# Patient Record
Sex: Male | Born: 1965
Health system: Southern US, Community
[De-identification: ages and names within clinical notes are randomized; demographics above are authoritative.]

## PROBLEM LIST (undated history)

## (undated) DIAGNOSIS — R7989 Other specified abnormal findings of blood chemistry: Secondary | ICD-10-CM

## (undated) DIAGNOSIS — G4730 Sleep apnea, unspecified: Secondary | ICD-10-CM

## (undated) DIAGNOSIS — M1990 Unspecified osteoarthritis, unspecified site: Secondary | ICD-10-CM

## (undated) DIAGNOSIS — M545 Low back pain, unspecified: Secondary | ICD-10-CM

## (undated) DIAGNOSIS — N529 Male erectile dysfunction, unspecified: Secondary | ICD-10-CM

## (undated) DIAGNOSIS — K760 Fatty (change of) liver, not elsewhere classified: Secondary | ICD-10-CM

## (undated) DIAGNOSIS — T7840XA Allergy, unspecified, initial encounter: Secondary | ICD-10-CM

## (undated) DIAGNOSIS — N400 Enlarged prostate without lower urinary tract symptoms: Secondary | ICD-10-CM

## (undated) DIAGNOSIS — I10 Essential (primary) hypertension: Secondary | ICD-10-CM

## (undated) DIAGNOSIS — E785 Hyperlipidemia, unspecified: Secondary | ICD-10-CM

## (undated) DIAGNOSIS — E039 Hypothyroidism, unspecified: Secondary | ICD-10-CM

## (undated) DIAGNOSIS — K219 Gastro-esophageal reflux disease without esophagitis: Secondary | ICD-10-CM

## (undated) DIAGNOSIS — R748 Abnormal levels of other serum enzymes: Secondary | ICD-10-CM

## (undated) DIAGNOSIS — E119 Type 2 diabetes mellitus without complications: Secondary | ICD-10-CM

## (undated) HISTORY — DX: Abnormal levels of other serum enzymes: R74.8

## (undated) HISTORY — DX: Allergy, unspecified, initial encounter: T78.40XA

## (undated) HISTORY — PX: WISDOM TOOTH EXTRACTION: SHX21

## (undated) HISTORY — DX: Essential (primary) hypertension: I10

## (undated) HISTORY — DX: Male erectile dysfunction, unspecified: N52.9

## (undated) HISTORY — DX: Unspecified osteoarthritis, unspecified site: M19.90

## (undated) HISTORY — DX: Type 2 diabetes mellitus without complications: E11.9

## (undated) HISTORY — DX: Low back pain, unspecified: M54.50

## (undated) HISTORY — DX: Benign prostatic hyperplasia without lower urinary tract symptoms: N40.0

## (undated) HISTORY — PX: COLONOSCOPY: SHX174

## (undated) HISTORY — DX: Low back pain: M54.5

## (undated) HISTORY — DX: Sleep apnea, unspecified: G47.30

## (undated) HISTORY — DX: Gastro-esophageal reflux disease without esophagitis: K21.9

## (undated) HISTORY — DX: Other specified abnormal findings of blood chemistry: R79.89

## (undated) HISTORY — DX: Hypothyroidism, unspecified: E03.9

## (undated) HISTORY — DX: Fatty (change of) liver, not elsewhere classified: K76.0

## (undated) HISTORY — DX: Hyperlipidemia, unspecified: E78.5

## (undated) HISTORY — PX: HERNIA REPAIR: SHX51

---

## 1997-11-02 ENCOUNTER — Emergency Department (HOSPITAL_COMMUNITY): Admission: EM | Admit: 1997-11-02 | Discharge: 1997-11-02 | Payer: Self-pay | Admitting: Emergency Medicine

## 1998-05-18 ENCOUNTER — Other Ambulatory Visit: Admission: RE | Admit: 1998-05-18 | Discharge: 1998-05-18 | Payer: Self-pay | Admitting: Urology

## 2002-10-25 ENCOUNTER — Emergency Department (HOSPITAL_COMMUNITY): Admission: EM | Admit: 2002-10-25 | Discharge: 2002-10-25 | Payer: Self-pay | Admitting: Emergency Medicine

## 2002-10-25 ENCOUNTER — Encounter: Payer: Self-pay | Admitting: Emergency Medicine

## 2004-09-30 ENCOUNTER — Ambulatory Visit: Payer: Self-pay | Admitting: Internal Medicine

## 2004-12-28 ENCOUNTER — Ambulatory Visit: Payer: Self-pay | Admitting: Internal Medicine

## 2005-09-15 ENCOUNTER — Ambulatory Visit: Payer: Self-pay | Admitting: Family Medicine

## 2007-02-22 DIAGNOSIS — J309 Allergic rhinitis, unspecified: Secondary | ICD-10-CM

## 2007-02-22 DIAGNOSIS — E039 Hypothyroidism, unspecified: Secondary | ICD-10-CM | POA: Insufficient documentation

## 2007-02-22 DIAGNOSIS — K219 Gastro-esophageal reflux disease without esophagitis: Secondary | ICD-10-CM

## 2007-02-22 DIAGNOSIS — M545 Low back pain: Secondary | ICD-10-CM | POA: Insufficient documentation

## 2008-03-12 ENCOUNTER — Ambulatory Visit: Payer: Self-pay | Admitting: Family Medicine

## 2008-03-12 DIAGNOSIS — R198 Other specified symptoms and signs involving the digestive system and abdomen: Secondary | ICD-10-CM

## 2008-03-12 DIAGNOSIS — Z8719 Personal history of other diseases of the digestive system: Secondary | ICD-10-CM

## 2008-03-13 ENCOUNTER — Ambulatory Visit: Payer: Self-pay | Admitting: Gastroenterology

## 2008-03-13 DIAGNOSIS — K5900 Constipation, unspecified: Secondary | ICD-10-CM | POA: Insufficient documentation

## 2008-03-13 DIAGNOSIS — K625 Hemorrhage of anus and rectum: Secondary | ICD-10-CM

## 2008-03-13 DIAGNOSIS — R079 Chest pain, unspecified: Secondary | ICD-10-CM

## 2008-03-13 LAB — CONVERTED CEMR LAB
Basophils Absolute: 0.1 10*3/uL (ref 0.0–0.1)
Basophils Relative: 1.4 % (ref 0.0–3.0)
Eosinophils Absolute: 0.1 10*3/uL (ref 0.0–0.7)
Eosinophils Relative: 3.6 % (ref 0.0–5.0)
HCT: 47.1 % (ref 39.0–52.0)
Hemoglobin: 16 g/dL (ref 13.0–17.0)
Lymphocytes Relative: 50.5 % — ABNORMAL HIGH (ref 12.0–46.0)
MCHC: 34 g/dL (ref 30.0–36.0)
MCV: 94.8 fL (ref 78.0–100.0)
Monocytes Absolute: 0.6 10*3/uL (ref 0.1–1.0)
Monocytes Relative: 16.3 % — ABNORMAL HIGH (ref 3.0–12.0)
Neutro Abs: 1 10*3/uL — ABNORMAL LOW (ref 1.4–7.7)
Neutrophils Relative %: 28.2 % — ABNORMAL LOW (ref 43.0–77.0)
Platelets: 219 10*3/uL (ref 150–400)
RBC: 4.96 M/uL (ref 4.22–5.81)
RDW: 11.9 % (ref 11.5–14.6)
TSH: 3.27 microintl units/mL (ref 0.35–5.50)
WBC: 3.7 10*3/uL — ABNORMAL LOW (ref 4.5–10.5)

## 2008-03-19 ENCOUNTER — Ambulatory Visit: Payer: Self-pay | Admitting: Gastroenterology

## 2008-04-29 ENCOUNTER — Telehealth: Payer: Self-pay | Admitting: Internal Medicine

## 2008-05-20 ENCOUNTER — Ambulatory Visit (HOSPITAL_BASED_OUTPATIENT_CLINIC_OR_DEPARTMENT_OTHER): Admission: RE | Admit: 2008-05-20 | Discharge: 2008-05-20 | Payer: Self-pay | Admitting: Internal Medicine

## 2008-05-20 ENCOUNTER — Encounter: Payer: Self-pay | Admitting: Internal Medicine

## 2008-06-02 ENCOUNTER — Ambulatory Visit: Payer: Self-pay | Admitting: Pulmonary Disease

## 2009-01-01 ENCOUNTER — Encounter: Payer: Self-pay | Admitting: Internal Medicine

## 2009-07-08 ENCOUNTER — Encounter: Payer: Self-pay | Admitting: Internal Medicine

## 2009-09-07 ENCOUNTER — Ambulatory Visit: Payer: Self-pay | Admitting: Family Medicine

## 2009-09-07 DIAGNOSIS — G4730 Sleep apnea, unspecified: Secondary | ICD-10-CM | POA: Insufficient documentation

## 2009-09-07 DIAGNOSIS — N529 Male erectile dysfunction, unspecified: Secondary | ICD-10-CM | POA: Insufficient documentation

## 2009-09-09 ENCOUNTER — Encounter: Payer: Self-pay | Admitting: Internal Medicine

## 2009-09-09 LAB — CONVERTED CEMR LAB
ALT: 35 units/L (ref 0–53)
AST: 29 units/L (ref 0–37)
Albumin: 4.3 g/dL (ref 3.5–5.2)
BUN: 11 mg/dL (ref 6–23)
Basophils Relative: 1 % (ref 0.0–3.0)
Bilirubin, Direct: 0 mg/dL (ref 0.0–0.3)
Calcium: 9.3 mg/dL (ref 8.4–10.5)
Creatinine, Ser: 1.2 mg/dL (ref 0.4–1.5)
Eosinophils Absolute: 0.2 10*3/uL (ref 0.0–0.7)
GFR calc non Af Amer: 84.54 mL/min (ref >60)
Glucose, Bld: 77 mg/dL (ref 70–99)
MCHC: 34.7 g/dL (ref 30.0–36.0)
MCV: 93.4 fL (ref 78.0–100.0)
Monocytes Absolute: 0.7 10*3/uL (ref 0.1–1.0)
Monocytes Relative: 13.6 % — ABNORMAL HIGH (ref 3.0–12.0)
Neutro Abs: 2.5 10*3/uL (ref 1.4–7.7)
PSA: 0.52 ng/mL (ref 0.10–4.00)
Potassium: 4.3 meq/L (ref 3.5–5.1)
RBC: 4.64 M/uL (ref 4.22–5.81)

## 2009-09-13 ENCOUNTER — Telehealth: Payer: Self-pay | Admitting: Internal Medicine

## 2009-09-27 ENCOUNTER — Ambulatory Visit: Payer: Self-pay | Admitting: Pulmonary Disease

## 2009-09-27 DIAGNOSIS — G4733 Obstructive sleep apnea (adult) (pediatric): Secondary | ICD-10-CM

## 2009-10-20 ENCOUNTER — Encounter: Payer: Self-pay | Admitting: Pulmonary Disease

## 2009-11-01 ENCOUNTER — Ambulatory Visit: Payer: Self-pay | Admitting: Pulmonary Disease

## 2009-12-14 ENCOUNTER — Encounter: Payer: Self-pay | Admitting: Internal Medicine

## 2010-01-11 ENCOUNTER — Encounter: Payer: Self-pay | Admitting: Internal Medicine

## 2010-01-21 ENCOUNTER — Encounter: Payer: Self-pay | Admitting: Internal Medicine

## 2010-01-27 ENCOUNTER — Ambulatory Visit: Payer: Self-pay | Admitting: Internal Medicine

## 2010-02-05 ENCOUNTER — Encounter: Payer: Self-pay | Admitting: Pulmonary Disease

## 2010-02-22 ENCOUNTER — Ambulatory Visit: Payer: Self-pay | Admitting: Internal Medicine

## 2010-02-22 DIAGNOSIS — R519 Headache, unspecified: Secondary | ICD-10-CM | POA: Insufficient documentation

## 2010-02-22 DIAGNOSIS — R51 Headache: Secondary | ICD-10-CM | POA: Insufficient documentation

## 2010-02-22 DIAGNOSIS — R42 Dizziness and giddiness: Secondary | ICD-10-CM

## 2010-06-23 NOTE — Assessment & Plan Note (Signed)
Summary: dizziness/headaches/?vertigo/cjr   Vital Signs:  Patient profile:   45 year old male Weight:      236 pounds Temp:     98.4 degrees F oral BP sitting:   118 / 76  (left arm) Cuff size:   regular  Vitals Entered By: Duard Brady LPN (February 22, 2010 2:40 PM) CC: c/o vertigo, headache x 2wks , ??sinus - otc meds? Is Patient Diabetic? No   Primary Care Provider:  Eleonore Chiquito MD  CC:  c/o vertigo, headache x 2wks , and ??sinus - otc meds?.  History of Present Illness: 45 year old patient who is in today with a two-day history of intermittent.  The headaches and mild vertigo.  he has been evaluating that wake [employer] recently and completed a tapering dose of prednisone due to inflammatory changes about the right eye.  He is unaware of a specific diagnosis.  Evaluation did include a head CT.  He does have a history of allergic rhinitis and for the past few days.  Has had worsening vertigo and associated headaches.  Denies much in the way of URI symptoms.  He has been off prednisone for approximately 4 days.  He has chronic back pain gastroesophageal reflux disease and history of obstructive sleep apnea  Allergies (verified): No Known Drug Allergies  Past History:  Past Medical History: Reviewed history from 11/01/2009 and no changes required. ED Allergic rhinitis GERD Hypothyroidism Low back pain osa  Review of Systems       The patient complains of headaches.  The patient denies anorexia, fever, weight loss, weight gain, vision loss, decreased hearing, hoarseness, chest pain, syncope, dyspnea on exertion, peripheral edema, prolonged cough, hemoptysis, abdominal pain, melena, severe indigestion/heartburn, hematuria, incontinence, genital sores, muscle weakness, suspicious skin lesions, transient blindness, difficulty walking, depression, unusual weight change, abnormal bleeding, enlarged lymph nodes, angioedema, breast masses, and testicular masses.      Physical Exam  General:  overweight-appearing.  normal blood pressureoverweight-appearing.   Head:  Normocephalic and atraumatic without obvious abnormalities. No apparent alopecia or balding. Eyes:  No corneal or conjunctival inflammation noted. EOMI. Perrla. Funduscopic exam benign, without hemorrhages, exudates or papilledema. Vision grossly normal. Ears:  External ear exam shows no significant lesions or deformities.  Otoscopic examination reveals clear canals, tympanic membranes are intact bilaterally without bulging, retraction, inflammation or discharge. Hearing is grossly normal bilaterally. Mouth:  Oral mucosa and oropharynx without lesions or exudates.  Teeth in good repair. Neck:  No deformities, masses, or tenderness noted. Lungs:  Normal respiratory effort, chest expands symmetrically. Lungs are clear to auscultation, no crackles or wheezes. Heart:  Normal rate and regular rhythm. S1 and S2 normal without gallop, murmur, click, rub or other extra sounds.   Impression & Recommendations:  Problem # 1:  HEADACHE (ICD-784.0)  His updated medication list for this problem includes:    Nabumetone 750 Mg Tabs (Nabumetone) .Marland Kitchen... Take 1 tablet by mouth two times a day as needed    Hydrocodone-acetaminophen 5-500 Mg Tabs (Hydrocodone-acetaminophen) .Marland Kitchen... Take 1 to 2 tabs by mouth every 4 to 6 hours as needed for pain  His updated medication list for this problem includes:    Nabumetone 750 Mg Tabs (Nabumetone) .Marland Kitchen... Take 1 tablet by mouth two times a day as needed    Hydrocodone-acetaminophen 5-500 Mg Tabs (Hydrocodone-acetaminophen) .Marland Kitchen... Take 1 to 2 tabs by mouth every 4 to 6 hours as needed for pain  Problem # 2:  ALLERGIC RHINITIS (ICD-477.9)  His updated  medication list for this problem includes:    Fluticasone Propionate 50 Mcg/act Susp (Fluticasone propionate) ..... Use daily    Fexofenadine Hcl 180 Mg Tabs (Fexofenadine hcl) ..... One daily  His updated medication list for  this problem includes:    Fluticasone Propionate 50 Mcg/act Susp (Fluticasone propionate) ..... Use daily    Fexofenadine Hcl 180 Mg Tabs (Fexofenadine hcl) ..... One daily  Complete Medication List: 1)  Multi Vitamin Mens Tabs (Multiple vitamin) .... Take 1 tab daily 2)  Valium 5 Mg Tabs (Diazepam) .... Take one tab once daily as needed 3)  Viagra 50 Mg Tabs (Sildenafil citrate) .... Uad 4)  Nabumetone 750 Mg Tabs (Nabumetone) .... Take 1 tablet by mouth two times a day as needed 5)  Hydrocodone-acetaminophen 5-500 Mg Tabs (Hydrocodone-acetaminophen) .... Take 1 to 2 tabs by mouth every 4 to 6 hours as needed for pain 6)  Hydrocortisone Acetate 25 Mg Supp (Hydrocortisone acetate) .... Use every 6 hours as needed for bleeding 7)  Fluticasone Propionate 50 Mcg/act Susp (Fluticasone propionate) .... Use daily 8)  Fexofenadine Hcl 180 Mg Tabs (Fexofenadine hcl) .... One daily  Patient Instructions: 1)  Please schedule a follow-up appointment as needed. 2)  Limit your Sodium (Salt) to less than 2 grams a day(slightly less than 1/2 a teaspoon) to prevent fluid retention, swelling, or worsening of symptoms. 3)  It is important that you exercise regularly at least 20 minutes 5 times a week. If you develop chest pain, have severe difficulty breathing, or feel very tired , stop exercising immediately and seek medical attention. Prescriptions: FEXOFENADINE HCL 180 MG TABS (FEXOFENADINE HCL) one daily  #30 x 4   Entered and Authorized by:   Gordy Savers  MD   Signed by:   Gordy Savers  MD on 02/22/2010   Method used:   Print then Give to Patient   RxID:   [id number] FLUTICASONE PROPIONATE 50 MCG/ACT SUSP (FLUTICASONE PROPIONATE) use daily  #1 x 3   Entered and Authorized by:   Gordy Savers  MD   Signed by:   Gordy Savers  MD on 02/22/2010   Method used:   Print then Give to Patient   RxID:   [id number] HYDROCODONE-ACETAMINOPHEN 5-500 MG TABS  (HYDROCODONE-ACETAMINOPHEN) take 1 to 2 tabs by mouth every 4 to 6 hours as needed for pain  #50 x 2   Entered and Authorized by:   Gordy Savers  MD   Signed by:   Gordy Savers  MD on 02/22/2010   Method used:   Print then Give to Patient   RxID:   [id number]

## 2010-06-23 NOTE — Miscellaneous (Signed)
Summary: rx androgel  Clinical Lists Changes  Medications: Added new medication of ANDROGEL PUMP 1 % GEL (TESTOSTERONE) four pumps qam as directed - Signed Rx of ANDROGEL PUMP 1 % GEL (TESTOSTERONE) four pumps qam as directed;  #90 day x 6;  Signed;  Entered by: Duard Brady LPN;  Authorized by: Gordy Savers  MD;  Method used: Historical    Prescriptions: ANDROGEL PUMP 1 % GEL (TESTOSTERONE) four pumps qam as directed  #90 day x 6   Entered by:   Duard Brady LPN   Authorized by:   Gordy Savers  MD   Signed by:   Duard Brady LPN on 16/02/9603   Method used:   Historical   RxID:   [id number]  called  [facility] [address] KIK

## 2010-06-23 NOTE — Letter (Signed)
Summary: [facility]  [facility]   Imported By: Lester [place] 10/26/2009 11:06:56  _____________________________________________________________________  External Attachment:    Type:   Image     Comment:   External Document

## 2010-06-23 NOTE — Miscellaneous (Signed)
Summary: optimal pressure 12cm.  Clinical Lists Changes  Orders: Added new Referral order of DME Referral (DME) - Signed auto shows adequate compliance, optimal pressure 12cm.

## 2010-06-23 NOTE — Assessment & Plan Note (Signed)
Summary: rov for osa   Copy to:  Eleonore Chiquito Primary Provider/Referring Provider:  Eleonore Chiquito MD  CC:  Pt is here for a 5 week f/u appt.  Pt states he is wearing his cpap machine every night.  Approx 5 hours per night.  Pt  states he switched from full face mask to nasal  mask. Pt states he used up Nasonex samples but didn't feel he needed to fill rx.  pt denied nasal congestion.  Marland Kitchen  History of Present Illness: the pt comes in today for f/u of his osa.  At the last visit, he was started on cpap, and has been doing well so far.  He is wearing cpap compliantly, and denies any pressure issues.  He did have to change his  full face mask to a nasal mask with chin strap due to intolerance issues.  He is doing much better with this, and denies mouth opening.  His wife has not heard breakthru snoring.  He feels that he is sleeping much better, and thinks his daytime alertness has improved.   Current Medications (verified): 1)  Multi Vitamin Mens  Tabs (Multiple Vitamin) .... Take 1 Tab Daily 2)  Valium 5 Mg Tabs (Diazepam) .... Take One Tab Once Daily As Needed 3)  Viagra 50 Mg Tabs (Sildenafil Citrate) .... Uad 4)  Nabumetone 750 Mg Tabs (Nabumetone) .... Take 1 Tablet By Mouth Two Times A Day As Needed 5)  Hydrocodone-Acetaminophen 5-500 Mg Tabs (Hydrocodone-Acetaminophen) .... Take 1 To 2 Tabs By Mouth Every 4 To 6 Hours As Needed For Pain  Allergies (verified): No Known Drug Allergies  Past History:  Past Medical History: ED Allergic rhinitis GERD Hypothyroidism Low back pain osa  Past Surgical History: Reviewed history from 03/13/2008 and no changes required. Hernia surgery x 2  1968 abd 1973  Review of Systems  The patient denies shortness of breath with activity, shortness of breath at rest, productive cough, non-productive cough, coughing up blood, chest pain, irregular heartbeats, acid heartburn, indigestion, loss of appetite, weight change, abdominal pain,  difficulty swallowing, sore throat, tooth/dental problems, headaches, nasal congestion/difficulty breathing through nose, sneezing, itching, ear ache, anxiety, depression, hand/feet swelling, joint stiffness or pain, rash, change in color of mucus, and fever.    Vital Signs:  Patient profile:   45 year old male Height:      71 inches Weight:      228.31 pounds O2 Sat:      96 % on Room air Temp:     98.4 degrees F oral Pulse rate:   67 / minute BP sitting:   110 / 70  (right arm) Cuff size:   regular  Vitals Entered By: Arman Filter LPN (November 01, 2009 11:53 AM)  O2 Flow:  Room air CC: Pt is here for a 5 week f/u appt.  Pt states he is wearing his cpap machine every night.  Approx 5 hours per night.  Pt  states he switched from full face mask to nasal  mask. Pt states he used up Nasonex samples but didn't feel he needed to fill rx.  pt denied nasal congestion.   Comments Medications reviewed with patient Arman Filter LPN  November 01, 2009 11:56 AM    Physical Exam  General:  ow male in nad Nose:  no skin breakdown or pressure necrosis turbinate hypertrophy with nasal airway compromise. Mouth:  clear except for soft tissue redundancy Extremities:  no edema or cyanosis Neurologic:  alert, but appears a  little sleepy, moves all 4.   Impression & Recommendations:  Problem # 1:  OBSTRUCTIVE SLEEP APNEA (ICD-327.23) the pt has been wearing cpap compliantly, and has seen improvement in his sleep and daytime alertness.  We need to optimize pressure for him, and he needs to work aggressively on weight loss.  He will followup in 6mos if doing well. Care Plan:  At this point, will arrange for the patient's machine to be changed over to auto mode for 2 weeks to optimize their pressure.  I will review the downloaded data once sent by dme, and also evaluate for compliance, leaks, and residual osa.  I will call the patient and dme to discuss the results, and have the patient's machine set  appropriately.  This will serve as the pt's cpap pressure titration.  Other Orders: Est. Patient Level IV (16109) DME Referral (DME)  Patient Instructions: 1)  will optimize pressure for you with the automatic mode built into the machine, and will let you know the results 2)  work on weight loss 3)  followup with me in 6mos, but call if having issues with cpap tolerance.

## 2010-06-23 NOTE — Progress Notes (Signed)
Summary: rx and labs  Phone Note Call from Patient   Caller: Patient Call For: Gordy Savers  MD Reason for Call: Talk to Nurse Summary of Call: rx for androgel not at pharm - please call at [phone] Initial call taken by: Duard Brady LPN,  September 13, 2009 12:16 PM  Follow-up for Phone Call        called [facility] [address] - rx was called in on 09/09/09 - spoke with andy the pharmacist - could not find - took new order . KIK Follow-up by: Duard Brady LPN,  September 13, 2009 12:16 PM  Additional Follow-up for Phone Call Additional follow up Details #1::        attempt to call pt at number left - ans mach - LMTCB if nneded - [facility] , went over labs . KIK Additional Follow-up by: Duard Brady LPN,  September 13, 2009 12:24 PM

## 2010-06-23 NOTE — Assessment & Plan Note (Signed)
Summary: PROBLEMS SLEEPING // RS   Vital Signs:  Patient profile:   45 year old male Height:      71 inches Weight:      226 pounds BMI:     31.63 Temp:     98.1 degrees F BP sitting:   114 / 90  (left arm) Cuff size:   regular  Vitals Entered By: Kern Reap CMA Duncan Dull) (September 07, 2009 1:56 PM) CC: sleep concerns, possible ED, chest congestion Is Patient Diabetic? No Pain Assessment Patient in pain? no        Primary Care Provider:  Eleonore Chiquito MD  CC:  sleep concerns, possible ED, and chest congestion.  History of Present Illness: Walter Jordan is a 45 year old, married male, nonsmoker, patient of Dr. Vergia Alcon two comes in today for evaluation of 3 problems.  He states he year ago.  He was having trouble sleeping snoring, and his wife complaining might have sleep apnea.  He therefore had a sleep study, but nobody called him in the past year.  He continues to have the same symptoms.  We were referred to Dr. Shelle Iron for further evaluation of this problem.  He also complains of decreased libido and decreased function.  Would like that evaluated.  He had a gentle and prostate exam within the last 12 months, which were normal.  He also complains of head congestion, postnasal drip for the past two to 3 months.  No history of previous allergy problems.  Review of systems negative.  Environmental review of systems shows no new pets in the house.  The dog's cats etc.  Allergies (verified): No Known Drug Allergies  Past History:  Past medical, surgical, family and social histories (including risk factors) reviewed for relevance to current acute and chronic problems.  Past Medical History: Reviewed history from 02/22/2007 and no changes required. ED Allergic rhinitis GERD Hypothyroidism Low back pain  Past Surgical History: Reviewed history from 03/13/2008 and no changes required. Hernia surgery x 2  1968 abd 1973  Family History: Reviewed history from 03/13/2008 and no changes  required. Family History Depression Family History Diabetes 1st degree relative Family History Hypertension Family History Kidney disease Stomach cancer-Father Diabetes-Father, Maternal grandmother, Paternal grandmother,brother  Social History: Reviewed history from 03/13/2008 and no changes required. Married Never Smoked Occupation: Loss adjuster, chartered Alcohol Use - no Daily Caffeine Use-morning coffee Illicit Drug Use - no Patient gets regular exercise. Works out  Review of Systems      See HPI  Physical Exam  General:  Well-developed,well-nourished,in no acute distress; alert,appropriate and cooperative throughout examination Head:  Normocephalic and atraumatic without obvious abnormalities. No apparent alopecia or balding. Eyes:  No corneal or conjunctival inflammation noted. EOMI. Perrla. Funduscopic exam benign, without hemorrhages, exudates or papilledema. Vision grossly normal. Ears:  External ear exam shows no significant lesions or deformities.  Otoscopic examination reveals clear canals, tympanic membranes are intact bilaterally without bulging, retraction, inflammation or discharge. Hearing is grossly normal bilaterally. Nose:  External nasal examination shows no deformity or inflammation. Nasal mucosa are pink and moist without lesions or exudates. Mouth:  Oral mucosa and oropharynx without lesions or exudates.  Teeth in good repair. Neck:  No deformities, masses, or tenderness noted. Chest Wall:  No deformities, masses, tenderness or gynecomastia noted. Lungs:  Normal respiratory effort, chest expands symmetrically. Lungs are clear to auscultation, no crackles or wheezes.   Impression & Recommendations:  Problem # 1:  ALLERGIC RHINITIS (ICD-477.9) Assessment New  Orders: Venipuncture (91478) TLB-BMP (Basic  Metabolic Panel-BMET) (80048-METABOL) TLB-CBC Platelet - w/Differential (85025-CBCD) TLB-TSH (Thyroid Stimulating Hormone) (84443-TSH) TLB-Hepatic/Liver  Function Pnl (80076-HEPATIC) TLB-PSA (Prostate Specific Antigen) (84153-PSA) TLB-Testosterone, Total (84403-TESTO) Prescription Created Electronically [phone])  Problem # 2:  ERECTILE DYSFUNCTION, ORGANIC (ICD-607.84) Assessment: New  Orders: Venipuncture (95621) TLB-BMP (Basic Metabolic Panel-BMET) (80048-METABOL) TLB-CBC Platelet - w/Differential (85025-CBCD) TLB-TSH (Thyroid Stimulating Hormone) (84443-TSH) TLB-Hepatic/Liver Function Pnl (80076-HEPATIC) TLB-PSA (Prostate Specific Antigen) (84153-PSA) TLB-Testosterone, Total (84403-TESTO) Prescription Created Electronically [phone])  His updated medication list for this problem includes:    Viagra 50 Mg Tabs (Sildenafil citrate) ..... Uad  Problem # 3:  SLEEP APNEA (ICD-780.57) Assessment: Unchanged  Orders: Venipuncture (78469) TLB-BMP (Basic Metabolic Panel-BMET) (80048-METABOL) TLB-CBC Platelet - w/Differential (85025-CBCD) TLB-TSH (Thyroid Stimulating Hormone) (84443-TSH) TLB-Hepatic/Liver Function Pnl (80076-HEPATIC) TLB-PSA (Prostate Specific Antigen) (84153-PSA) TLB-Testosterone, Total (84403-TESTO) Prescription Created Electronically [id number]) Pulmonary Referral (Pulmonary)  Complete Medication List: 1)  Multi Vitamin Mens Tabs (Multiple vitamin) .... Take 1 tab daily 2)  Flexeril 5 Mg Tabs (Cyclobenzaprine hcl) .... Take 1 tab as needed 3)  Anusol-hc 25 Mg Supp (Hydrocortisone acetate) .... Use rectally at bedtime x 10 days 4)  Valium 5 Mg Tabs (Diazepam) .... Take one tab once daily as needed 5)  Viagra 50 Mg Tabs (Sildenafil citrate) .... Uad  Patient Instructions: 1)  I will discuss your concerns with Dr. Kirtland Bouchard  and have him call you about your lab work. 2)  I will also refer you to Dr. Stann Mainland for evaluation of the sleep apnea. 3)  For the allergic rhinitis, take 10 mg of Claritin plain in the morning. 4)  For the erectile dysfunction, take a half of a Viagra one hour prior to sex with  water. Prescriptions: VIAGRA 50 MG TABS (SILDENAFIL CITRATE) UAD  #6 x 11   Entered and Authorized by:   Roderick Pee MD   Signed by:   Roderick Pee MD on 09/07/2009   Method used:   [facility]  [id number]       [address]       [place], [place]  [address]       Ph: [phone] or [phone]       Fax: [phone]   RxID:   [id number]

## 2010-06-23 NOTE — Assessment & Plan Note (Signed)
Summary: RECTAL BLEEDING/CJR   Vital Signs:  Patient profile:   45 year old male Weight:      237 pounds Temp:     98.3 degrees F oral BP sitting:   130 / 90  (left arm) Cuff size:   regular  Vitals Entered By: Duard Brady LPN (January 27, 2010 10:17 AM) CC: c/o rectal bleed x4 days  Is Patient Diabetic? No   Primary Care Provider:  Eleonore Chiquito MD  CC:  c/o rectal bleed x4 days .  History of Present Illness: 45 year old patient who is seen today with a 4-day history of small volume bright red rectal bleeding.  He has had some mild constipation issues that have been unchanged.  This morning.  He had small-volume rectal bleeding, but without a normal bowel movement.  The stool itself is normal, but associated with bright red blood.  There is been no rectal pain or change in his bowel habits.  He did have a colonoscopy performed in October of 2009 due to  hematochezia and this was negative, except for hemorrhoidal disease.  Allergies (verified): No Known Drug Allergies  Past History:  Past Medical History: Reviewed history from 11/01/2009 and no changes required. ED Allergic rhinitis GERD Hypothyroidism Low back pain osa  Review of Systems       The patient complains of hematochezia.  The patient denies anorexia, fever, weight loss, weight gain, vision loss, decreased hearing, hoarseness, chest pain, syncope, dyspnea on exertion, peripheral edema, prolonged cough, headaches, hemoptysis, abdominal pain, melena, severe indigestion/heartburn, hematuria, incontinence, genital sores, muscle weakness, suspicious skin lesions, transient blindness, difficulty walking, depression, unusual weight change, abnormal bleeding, enlarged lymph nodes, angioedema, breast masses, and testicular masses.    Physical Exam  General:  overweight-appearing.  140/86overweight-appearing.   Neck:  No deformities, masses, or tenderness noted. Lungs:  Normal respiratory effort, chest  expands symmetrically. Lungs are clear to auscultation, no crackles or wheezes. Heart:  Normal rate and regular rhythm. S1 and S2 normal without gallop, murmur, click, rub or other extra sounds. Rectal:  No external abnormalities noted. Normal sphincter tone. No rectal masses or tenderness. stool  appeared normal, but was hematest positive   Impression & Recommendations:  Problem # 1:  RECTAL BLEEDING (ICD-569.3) Will retreat with the [facility] suppositories.  Will consider general surgical referral.  If bleeding persists  Complete Medication List: 1)  Multi Vitamin Mens Tabs (Multiple vitamin) .... Take 1 tab daily 2)  Valium 5 Mg Tabs (Diazepam) .... Take one tab once daily as needed 3)  Viagra 50 Mg Tabs (Sildenafil citrate) .... Uad 4)  Nabumetone 750 Mg Tabs (Nabumetone) .... Take 1 tablet by mouth two times a day as needed 5)  Hydrocodone-acetaminophen 5-500 Mg Tabs (Hydrocodone-acetaminophen) .... Take 1 to 2 tabs by mouth every 4 to 6 hours as needed for pain 6)  Prednisone 20 Mg Tabs (Prednisone) .... Tapering  60mg  - 50 mg - 40 mg as directed 7)  Hydrocortisone Acetate 25 Mg Supp (Hydrocortisone acetate) .... Use every 6 hours as needed for bleeding  Patient Instructions: 1)  call if rectal bleeding persists or worsens 2)  use the medications as directed Prescriptions: HYDROCORTISONE ACETATE 25 MG SUPP (HYDROCORTISONE ACETATE) use every 6 hours as needed for bleeding  #24 x 6   Entered and Authorized by:   Gordy Savers  MD   Signed by:   Gordy Savers  MD on 01/27/2010   Method used:   [facility]  [address] [id number]       [address]       [place], [place]  [address]       Ph: [phone] or [phone]       Fax: [phone]   RxID:   [id number]

## 2010-06-23 NOTE — Letter (Signed)
Summary: [facility]  [facility]   Imported By: Maryln Gottron 07/14/2009 13:41:39  _____________________________________________________________________  External Attachment:    Type:   Image     Comment:   External Document

## 2010-06-23 NOTE — Assessment & Plan Note (Signed)
Summary: consult for osa   Copy to:  Eleonore Chiquito Primary Provider/Referring Provider:  Eleonore Chiquito MD  CC:  Sleep Consult.  History of Present Illness: The pt is a 45y/o male who I have been asked to see for osa.  He was diagnosed with mild osa in 2009, with RDI of 11/hr, and desat to 90%.  The decision was made to treat his osa conservatively with weight loss, but the pt has not been completely successful.  He has lost 10 pounds from 2 years ago by our records.  He has been noted to have persistent snoring, but no one has mentioned abnormal breathing pattern during sleep.  He typically goes to bed at MN, and arises at 6am to start his day.  He has very restless sleep, but no issues getting back to sleep once he awakens.  He feels rested at first when he gets up, but then begins to have sleep pressure at his desk during the day.  He also dozes in the evening while watching tv.  His symptoms are much worse now than 2 years ago.  He also notes some sleep pressure driving, but no frank sleepiness.  Current Medications (verified): 1)  Multi Vitamin Mens  Tabs (Multiple Vitamin) .... Take 1 Tab Daily 2)  Valium 5 Mg Tabs (Diazepam) .... Take One Tab Once Daily As Needed 3)  Viagra 50 Mg Tabs (Sildenafil Citrate) .... Uad 4)  Androgel Pump 1 % Gel (Testosterone) .... Four Pumps Qam As Directed 5)  Nabumetone 750 Mg Tabs (Nabumetone) .... Take 1 Tablet By Mouth Two Times A Day As Needed 6)  Hydrocodone-Acetaminophen 5-500 Mg Tabs (Hydrocodone-Acetaminophen) .... Take 1 To 2 Tabs By Mouth Every 4 To 6 Hours As Needed For Pain  Allergies (verified): No Known Drug Allergies  Past History:  Past Medical History: Reviewed history from 02/22/2007 and no changes required. ED Allergic rhinitis GERD Hypothyroidism Low back pain  Past Surgical History: Reviewed history from 03/13/2008 and no changes required. Hernia surgery x 2  1968 abd 1973  Family History: Reviewed history from  03/13/2008 and no changes required. Family History Depression Family History Diabetes 1st degree relative Family History Hypertension Family History Kidney disease Stomach  cancer-Father Diabetes-Father, Maternal grandmother, Paternal grandmother,brother Colon Cancer paternal grandmother  Social History: Reviewed history from 03/13/2008 and no changes required. Married with children. Never Smoked Occupation: Loss adjuster, chartered Alcohol Use - no Daily Caffeine Use-morning coffee Illicit Drug Use - no Patient gets regular exercise. Works out  Review of Systems       The patient complains of shortness of breath with activity, weight change, sore throat, and anxiety.  The patient denies shortness of breath at rest, productive cough, non-productive cough, coughing up blood, chest pain, irregular heartbeats, acid heartburn, indigestion, loss of appetite, abdominal pain, difficulty swallowing, tooth/dental problems, headaches, nasal congestion/difficulty breathing through nose, sneezing, itching, ear ache, depression, hand/feet swelling, joint stiffness or pain, rash, change in color of mucus, and fever.    Vital Signs:  Patient profile:   45 year old male Height:      71 inches Weight:      227 pounds BMI:     31.77 O2 Sat:      97 % on Room air Temp:     98.2 degrees F oral Pulse rate:   61 / minute BP sitting:   110 / 78  (left arm) Cuff size:   large  Vitals Entered By: Arman Filter LPN (Sep 27, 2009 11:44 AM)  O2 Flow:  Room air CC: Sleep Consult Comments Medications reviewed with patient Arman Filter LPN  Sep 28, 2954 11:44 AM    Physical Exam  General:  ow male in nad Eyes:  PERRLA and EOMI.   Nose:  large swollen turbs bilat, deviated septum to left with narrowing.  Mouth:  normal palate and uvula, but small posterior pharyngeal space.  large tongue Neck:  no jvd, tmg, LN Lungs:  clear to auscultation Heart:  rrr, no mrg Abdomen:  soft and nontender,  bs+ Extremities:  no edema noted, pulses intact distally no cyanosis Neurologic:  alert and oriented, moves all 4.    Impression & Recommendations:  Problem # 1:  OBSTRUCTIVE SLEEP APNEA (ICD-327.23) the pt has mild osa by his sleep study in 2009, but feels that his symptoms have gotten much worse since that time.  HIs weight is actually a little bit less from that time, but the pt feels his sleep apnea is greatly impacting both his and his wife's QOL.  I have discussed the various treatment options with him, including surgery, dental appliance, and cpap, in addition to weight loss.  After a long discussion of the advantages and disadvantages of each, the patient has decided to go with cpap.  Will also try on nasal ICS to improve his nasal airway.  Medications Added to Medication List This Visit: 1)  Nabumetone 750 Mg Tabs (Nabumetone) .... Take 1 tablet by mouth two times a day as needed 2)  Hydrocodone-acetaminophen 5-500 Mg Tabs (Hydrocodone-acetaminophen) .... Take 1 to 2 tabs by mouth every 4 to 6 hours as needed for pain 3)  Nasonex 50 Mcg/act Susp (Mometasone furoate) .... Two puffs each nostril daily  Other Orders: Consultation Level IV (21308) DME Referral (DME)  Patient Instructions: 1)  try nasonex nasal spray  2 each nostril each am...fill prescription if works for you 2)  will start on cpap, and please call if having tolerance issues. 3)  work on weight loss 4)  get more sleep...go to bed earlier for your bedtime. 5)  followup with me in 4 weeks.   Prescriptions: NASONEX 50 MCG/ACT  SUSP (MOMETASONE FUROATE) Two puffs each nostril daily  #1 x 6   Entered and Authorized by:   Barbaraann Share MD   Signed by:   Barbaraann Share MD on 09/27/2009   Method used:   Print then Give to Patient   RxID:   [id number]

## 2010-06-23 NOTE — Letter (Signed)
Summary: [facility]  [facility]   Imported By: Maryln Gottron 02/01/2010 14:33:17  _____________________________________________________________________  External Attachment:    Type:   Image     Comment:   External Document

## 2010-07-06 ENCOUNTER — Ambulatory Visit
Admission: RE | Admit: 2010-07-06 | Discharge: 2010-07-06 | Disposition: A | Discharge status: Home / Self Care | Payer: 59 | Source: Ambulatory Visit | Attending: Family Medicine | Admitting: Family Medicine

## 2010-07-06 ENCOUNTER — Other Ambulatory Visit: Payer: Self-pay | Admitting: Family Medicine

## 2010-07-06 DIAGNOSIS — M545 Low back pain: Secondary | ICD-10-CM

## 2010-10-04 NOTE — Procedures (Signed)
Walter, Jordan NO.:  [id number]   MEDICAL RECORD NO.:  [id number]          PATIENT TYPE:  OUT   LOCATION:  SLEEP CENTER                 FACILITY[facility]  [facility]   PHYSICIAN:  Barbaraann Share, MD,FCCPDATE OF BIRTH:  07-[birth year]65   DATE OF STUDY:  05/20/2008                            NOCTURNAL POLYSOMNOGRAM   REFERRING PHYSICIAN:   LOCATION:  Sleep lab.   REFERRING PHYSICIAN:  Gordy Savers, MD   INDICATION FOR STUDY:  Hypersomnia with sleep apnea.   EPWORTH SLEEPINESS SCORE:  5.   MEDICATIONS:   SLEEP ARCHITECTURE:  The patient had a total sleep time of 297 minutes  with no slow-wave sleep and 33 minutes of REM.  Sleep onset latency was  normal at 28 minutes, and REM onset was quite prolonged at 191 minutes.  Sleep efficiency was moderately decreased at 82%.   RESPIRATORY DATA:  The patient was found to have 1 central apnea and 18  obstructive hypopneas for an apnea-hypopnea index of 4 events per hour.  He was also found to have 35 respiratory effort-related arousals, giving  him a respiratory disturbance index of 11 events per hour.  The events  occurred more frequently in the supine position and there was moderate-  to-loud snoring noted throughout.   OXYGEN DATA:  There was O2 desaturation only as low as 90% with his  obstructive events.   CARDIAC DATA:  No clinically significant arrhythmias were noted.   MOVEMENT/PARASOMNIA:  No leg jerks or other abnormal behaviors were  seen.   IMPRESSIONS/RECOMMENDATIONS:  Very mild obstructive sleep apnea/hypopnea  syndrome with an apnea-hypopnea index of 4 events per hour, and a  respiratory disturbance index of 11 events per hour.  There was oxygen  desaturation as low as 90%.  Treatment for this degree of sleep apnea  can include a trial of weight loss alone if applicable, upper airway  surgery, oral appliance, and also continuous positive airway pressure.  This degree of sleep  apnea puts the  patient at very minimal  risk for cardiovascular events,  and therefore the treatment decision should be based on how much this is  impacting his quality of life.      Barbaraann Share, MD,FCCP  Diplomate, American Board of Sleep  Medicine  Electronically Signed     [facility]/MEDQ  D:  06/02/2008 15:15:18  T:  06/03/2008 02:14:11  Job:  [id number]

## 2011-03-02 ENCOUNTER — Other Ambulatory Visit (INDEPENDENT_AMBULATORY_CARE_PROVIDER_SITE_OTHER): Payer: 59

## 2011-03-02 DIAGNOSIS — Z0000 Encounter for general adult medical examination without abnormal findings: Secondary | ICD-10-CM

## 2011-03-02 LAB — BASIC METABOLIC PANEL
CO2: 29 mEq/L (ref 19–32)
Creatinine, Ser: 1.1 mg/dL (ref 0.4–1.5)
GFR: 91.88 mL/min (ref >60.00)

## 2011-03-02 LAB — LIPID PANEL
HDL: 45.4 mg/dL (ref >39.00)
LDL Cholesterol: 112 mg/dL — ABNORMAL HIGH (ref 0–99)
VLDL: 18.6 mg/dL (ref 0.0–40.0)

## 2011-03-02 LAB — POCT URINALYSIS DIPSTICK
Ketones, UA: NEGATIVE
Leukocytes, UA: NEGATIVE
Protein, UA: NEGATIVE
Spec Grav, UA: 1.02
Urobilinogen, UA: 0.2
pH, UA: 7

## 2011-03-02 LAB — CBC WITH DIFFERENTIAL/PLATELET
Basophils Absolute: 0 10*3/uL (ref 0.0–0.1)
Eosinophils Absolute: 0.2 10*3/uL (ref 0.0–0.7)
Hemoglobin: 16 g/dL (ref 13.0–17.0)
Lymphocytes Relative: 40.6 % (ref 12.0–46.0)
MCHC: 33.7 g/dL (ref 30.0–36.0)
Monocytes Relative: 13.9 % — ABNORMAL HIGH (ref 3.0–12.0)
Neutro Abs: 2.1 10*3/uL (ref 1.4–7.7)
Neutrophils Relative %: 41.6 % — ABNORMAL LOW (ref 43.0–77.0)
Platelets: 253 10*3/uL (ref 150.0–400.0)
RDW: 14.1 % (ref 11.5–14.6)

## 2011-03-02 LAB — HEPATIC FUNCTION PANEL
AST: 25 U/L (ref 0–37)
Albumin: 4.4 g/dL (ref 3.5–5.2)
Alkaline Phosphatase: 74 U/L (ref 39–117)
Total Bilirubin: 1.1 mg/dL (ref 0.3–1.2)

## 2011-03-09 ENCOUNTER — Ambulatory Visit (INDEPENDENT_AMBULATORY_CARE_PROVIDER_SITE_OTHER): Payer: 59 | Admitting: Internal Medicine

## 2011-03-09 ENCOUNTER — Encounter: Payer: Self-pay | Admitting: Internal Medicine

## 2011-03-09 VITALS — BP 110/80 | HR 74 | Temp 98.4°F | Resp 18 | Ht 71.25 in | Wt 236.0 lb

## 2011-03-09 DIAGNOSIS — Z0000 Encounter for general adult medical examination without abnormal findings: Secondary | ICD-10-CM

## 2011-03-09 NOTE — Progress Notes (Signed)
  Subjective:    Patient ID: Walter Jordan, male    DOB: June 30, 1965, 45 y.o.   MRN: [id number]  HPI  45 year old patient who is seen today for a preventive health examination. Medical issues include obstructive sleep apnea. He has a history of allergic rhinitis low back pain. He did have a screening colonoscopy in 2009 B2 rectal bleeding. He has done quite well today. No concerns or complaints his neck and back pain are fairly stable he did have a lumbar MRI earlier in the year.    Review of Systems  Constitutional: Negative for fever, chills, activity change, appetite change and fatigue.  HENT: Negative for hearing loss, ear pain, congestion, rhinorrhea, sneezing, mouth sores, trouble swallowing, neck pain, neck stiffness, dental problem, voice change, sinus pressure and tinnitus.   Eyes: Negative for photophobia, pain, redness and visual disturbance.  Respiratory: Negative for apnea, cough, choking, chest tightness, shortness of breath and wheezing.   Cardiovascular: Negative for chest pain, palpitations and leg swelling.  Gastrointestinal: Negative for nausea, vomiting, abdominal pain, diarrhea, constipation, blood in stool, abdominal distention, anal bleeding and rectal pain.  Genitourinary: Negative for dysuria, urgency, frequency, hematuria, flank pain, decreased urine volume, discharge, penile swelling, scrotal swelling, difficulty urinating, genital sores and testicular pain.  Musculoskeletal: Positive for back pain. Negative for myalgias, joint swelling, arthralgias and gait problem.  Skin: Negative for color change, rash and wound.  Neurological: Negative for dizziness, tremors, seizures, syncope, facial asymmetry, speech difficulty, weakness, light-headedness, numbness and headaches.  Hematological: Negative for adenopathy. Does not bruise/bleed easily.  Psychiatric/Behavioral: Negative for suicidal ideas, hallucinations, behavioral problems, confusion, sleep disturbance, self-injury,  dysphoric mood, decreased concentration and agitation. The patient is not nervous/anxious.        Objective:   Physical Exam  Constitutional: He appears well-developed and well-nourished.  HENT:  Head: Normocephalic and atraumatic.  Right Ear: External ear normal.  Left Ear: External ear normal.  Nose: Nose normal.  Mouth/Throat: Oropharynx is clear and moist.  Eyes: Conjunctivae and EOM are normal. Pupils are equal, round, and reactive to light. No scleral icterus.  Neck: Normal range of motion. Neck supple. No JVD present. No thyromegaly present.  Cardiovascular: Regular rhythm, normal heart sounds and intact distal pulses.  Exam reveals no gallop and no friction rub.   No murmur heard. Pulmonary/Chest: Effort normal and breath sounds normal. He exhibits no tenderness.  Abdominal: Soft. Bowel sounds are normal. He exhibits no distension and no mass. There is no tenderness.  Genitourinary: Prostate normal and penis normal.  Musculoskeletal: Normal range of motion. He exhibits no edema and no tenderness.  Lymphadenopathy:    He has no cervical adenopathy.  Neurological: He is alert. He has normal reflexes. No cranial nerve deficit. Coordination normal.  Skin: Skin is warm and dry. No rash noted.  Psychiatric: He has a normal mood and affect. His behavior is normal.          Assessment & Plan:    Preventive health examination Obstructive sleep apnea  Medical regimen unchanged medications refilled. Will return here in one year or as needed.

## 2011-08-02 ENCOUNTER — Encounter: Payer: Self-pay | Admitting: Internal Medicine

## 2011-08-02 ENCOUNTER — Ambulatory Visit (INDEPENDENT_AMBULATORY_CARE_PROVIDER_SITE_OTHER): Payer: 59 | Admitting: Internal Medicine

## 2011-08-02 VITALS — BP 102/70 | Temp 98.9°F | Wt 240.0 lb

## 2011-08-02 DIAGNOSIS — J309 Allergic rhinitis, unspecified: Secondary | ICD-10-CM

## 2011-08-02 DIAGNOSIS — J069 Acute upper respiratory infection, unspecified: Secondary | ICD-10-CM

## 2011-08-02 MED ORDER — DICLOFENAC SODIUM 75 MG PO TBEC: 75.0000 mg | DELAYED_RELEASE_TABLET | Freq: Two times a day (BID) | ORAL | Status: DC | PRN

## 2011-08-02 MED ORDER — HYDROCODONE-ACETAMINOPHEN 5-500 MG PO TABS: 1.0000 | ORAL_TABLET | Freq: Four times a day (QID) | ORAL | Status: DC | PRN

## 2011-08-02 MED ORDER — FLUTICASONE PROPIONATE 50 MCG/ACT NA SUSP: 2.0000 | Freq: Every day | NASAL | Status: DC

## 2011-08-02 MED ORDER — FEXOFENADINE HCL 180 MG PO TABS: 180.0000 mg | ORAL_TABLET | Freq: Every day | ORAL | Status: DC

## 2011-08-02 NOTE — Progress Notes (Signed)
  Subjective:    Patient ID: Walter Jordan, male    DOB: October 20, 1965, 46 y.o.   MRN: [id number]  HPI  46 year old patient who presents with a three-day history of intermittent fever weakness and myalgias. He has had minimal cough. His fever has now resolved but he still is quite weak. He has been using OTC medications. He has a history of obstructive sleep apnea allergic rhinitis and occasional low back pain.    Review of Systems  Constitutional: Positive for fatigue. Negative for fever, chills and appetite change.  HENT: Negative for hearing loss, ear pain, congestion, sore throat, trouble swallowing, neck stiffness, dental problem, voice change and tinnitus.   Eyes: Negative for pain, discharge and visual disturbance.  Respiratory: Positive for cough. Negative for chest tightness, wheezing and stridor.   Cardiovascular: Positive for chest pain. Negative for palpitations and leg swelling.  Gastrointestinal: Negative for nausea, vomiting, abdominal pain, diarrhea, constipation, blood in stool and abdominal distention.  Genitourinary: Negative for urgency, hematuria, flank pain, discharge, difficulty urinating and genital sores.  Musculoskeletal: Positive for myalgias. Negative for back pain, joint swelling, arthralgias and gait problem.  Skin: Negative for rash.  Neurological: Positive for weakness and light-headedness. Negative for dizziness, syncope, speech difficulty, numbness and headaches.  Hematological: Negative for adenopathy. Does not bruise/bleed easily.  Psychiatric/Behavioral: Negative for behavioral problems and dysphoric mood. The patient is not nervous/anxious.        Objective:   Physical Exam  Constitutional: He is oriented to person, place, and time. He appears well-developed and well-nourished. No distress.       Appears weak and unwell but in no acute distress. Blood pressure 110/70  HENT:  Head: Normocephalic.  Right Ear: External ear normal.  Left Ear: External ear  normal.  Eyes: Conjunctivae and EOM are normal.  Neck: Normal range of motion.  Cardiovascular: Normal rate, regular rhythm and normal heart sounds.   Pulmonary/Chest: Effort normal and breath sounds normal.  Abdominal: Bowel sounds are normal.  Musculoskeletal: Normal range of motion. He exhibits no edema and no tenderness.  Neurological: He is alert and oriented to person, place, and time.  Psychiatric: He has a normal mood and affect. His behavior is normal.          Assessment & Plan:   Viral syndrome. We'll treat symptomatically force fluids. Medications refilled Allergic rhinitis

## 2011-08-02 NOTE — Patient Instructions (Signed)
Get plenty of rest, Drink lots of  clear liquids, and use Tylenol  for fever and discomfort.    VIMOVO ONE TWICE DAILY  Call or return to clinic prn if these symptoms worsen or fail to improve as anticipated.

## 2011-09-05 DIAGNOSIS — Z0279 Encounter for issue of other medical certificate: Secondary | ICD-10-CM

## 2011-09-07 ENCOUNTER — Telehealth: Payer: Self-pay | Admitting: Internal Medicine

## 2011-09-07 NOTE — Telephone Encounter (Signed)
ppwk in the folder to be done

## 2011-09-07 NOTE — Telephone Encounter (Signed)
Patient called for an FMLA update and I spoke with the nurse and informed patient that the doctor is still working on it and the nurse will call upon completion.

## 2011-11-14 ENCOUNTER — Encounter: Payer: Self-pay | Admitting: Internal Medicine

## 2011-11-14 ENCOUNTER — Ambulatory Visit (INDEPENDENT_AMBULATORY_CARE_PROVIDER_SITE_OTHER): Payer: 59 | Admitting: Internal Medicine

## 2011-11-14 VITALS — BP 110/80 | Temp 98.4°F | Wt 238.0 lb

## 2011-11-14 DIAGNOSIS — J309 Allergic rhinitis, unspecified: Secondary | ICD-10-CM

## 2011-11-14 MED ORDER — FLUTICASONE PROPIONATE 50 MCG/ACT NA SUSP: 2.0000 | Freq: Every day | NASAL | Status: DC

## 2011-11-14 MED ORDER — HYDROCODONE-ACETAMINOPHEN 5-500 MG PO TABS: 1.0000 | ORAL_TABLET | Freq: Four times a day (QID) | ORAL | Status: DC | PRN

## 2011-11-14 MED ORDER — PREDNISONE 10 MG PO TABS: 10.0000 mg | ORAL_TABLET | Freq: Two times a day (BID) | ORAL | Status: DC

## 2011-11-14 NOTE — Patient Instructions (Signed)
Call or return to clinic prn if these symptoms worsen or fail to improve as anticipated.

## 2011-11-14 NOTE — Progress Notes (Signed)
  Subjective:    Patient ID: Walter Jordan, male    DOB: 07-14-1965, 46 y.o.   MRN: [id number]  HPI  46 year old patient who has a history of allergic rhinitis. He complains of increasing nasal congestion over the past 4-5 days and worsening sore throat over the past 2 days. He has been using Imitrex again but has been out of his fluticasone nasal spray no fever associated symptoms also include some chest congestion and nonproductive cough    Review of Systems  Constitutional: Negative for fever, chills, appetite change and fatigue.  HENT: Positive for congestion, sore throat and sinus pressure. Negative for hearing loss, ear pain, trouble swallowing, neck stiffness, dental problem, voice change and tinnitus.   Eyes: Negative for pain, discharge and visual disturbance.  Respiratory: Positive for cough. Negative for chest tightness, wheezing and stridor.   Cardiovascular: Negative for chest pain, palpitations and leg swelling.  Gastrointestinal: Negative for nausea, vomiting, abdominal pain, diarrhea, constipation, blood in stool and abdominal distention.  Genitourinary: Negative for urgency, hematuria, flank pain, discharge, difficulty urinating and genital sores.  Musculoskeletal: Negative for myalgias, back pain, joint swelling, arthralgias and gait problem.  Skin: Negative for rash.  Neurological: Negative for dizziness, syncope, speech difficulty, weakness, numbness and headaches.  Hematological: Negative for adenopathy. Does not bruise/bleed easily.  Psychiatric/Behavioral: Negative for behavioral problems and dysphoric mood. The patient is not nervous/anxious.        Objective:   Physical Exam  Constitutional: He is oriented to person, place, and time. He appears well-developed.  HENT:  Head: Normocephalic.  Right Ear: External ear normal.  Left Ear: External ear normal.       Low hanging soft palate with pharyngeal crowding Mild erythema of the oropharynx  Eyes: Conjunctivae and  EOM are normal.  Neck: Normal range of motion.  Cardiovascular: Normal rate and normal heart sounds.   Pulmonary/Chest: Effort normal and breath sounds normal. No respiratory distress. He has no wheezes. He has no rales.  Abdominal: Bowel sounds are normal.  Musculoskeletal: Normal range of motion. He exhibits no edema and no tenderness.  Lymphadenopathy:    He has no cervical adenopathy.  Neurological: He is alert and oriented to person, place, and time.  Psychiatric: He has a normal mood and affect. His behavior is normal.          Assessment & Plan:   Allergic rhinitis flare. We'll place on oral prednisone for 5 days. We'll refill fluticasone and continue antihistamines

## 2012-03-18 ENCOUNTER — Other Ambulatory Visit (INDEPENDENT_AMBULATORY_CARE_PROVIDER_SITE_OTHER): Payer: 59

## 2012-03-18 DIAGNOSIS — Z0000 Encounter for general adult medical examination without abnormal findings: Secondary | ICD-10-CM

## 2012-03-18 LAB — CBC WITH DIFFERENTIAL/PLATELET
Basophils Relative: 0.9 % (ref 0.0–3.0)
Eosinophils Absolute: 0.1 10*3/uL (ref 0.0–0.7)
HCT: 48.6 % (ref 39.0–52.0)
Hemoglobin: 16.1 g/dL (ref 13.0–17.0)
Lymphocytes Relative: 39.7 % (ref 12.0–46.0)
Lymphs Abs: 2.1 10*3/uL (ref 0.7–4.0)
MCHC: 33.2 g/dL (ref 30.0–36.0)
MCV: 93.8 fl (ref 78.0–100.0)
Monocytes Absolute: 0.7 10*3/uL (ref 0.1–1.0)
Neutro Abs: 2.3 10*3/uL (ref 1.4–7.7)
RBC: 5.18 Mil/uL (ref 4.22–5.81)

## 2012-03-18 LAB — BASIC METABOLIC PANEL
CO2: 28 mEq/L (ref 19–32)
Chloride: 106 mEq/L (ref 96–112)
Potassium: 4.6 mEq/L (ref 3.5–5.1)
Sodium: 139 mEq/L (ref 135–145)

## 2012-03-18 LAB — POCT URINALYSIS DIPSTICK
Blood, UA: NEGATIVE
Glucose, UA: NEGATIVE
Nitrite, UA: NEGATIVE
Urobilinogen, UA: 0.2

## 2012-03-18 LAB — HEPATIC FUNCTION PANEL
Albumin: 4.1 g/dL (ref 3.5–5.2)
Bilirubin, Direct: 0.1 mg/dL (ref 0.0–0.3)
Total Protein: 7.6 g/dL (ref 6.0–8.3)

## 2012-03-18 LAB — LIPID PANEL
HDL: 41.8 mg/dL (ref >39.00)
Total CHOL/HDL Ratio: 4

## 2012-03-18 LAB — PSA: PSA: 0.63 ng/mL (ref 0.10–4.00)

## 2012-03-25 ENCOUNTER — Encounter: Payer: Self-pay | Admitting: Internal Medicine

## 2012-03-25 ENCOUNTER — Ambulatory Visit (INDEPENDENT_AMBULATORY_CARE_PROVIDER_SITE_OTHER): Payer: 59 | Admitting: Internal Medicine

## 2012-03-25 VITALS — BP 112/70 | HR 68 | Temp 98.2°F | Resp 20 | Ht 71.0 in | Wt 241.0 lb

## 2012-03-25 DIAGNOSIS — G4730 Sleep apnea, unspecified: Secondary | ICD-10-CM

## 2012-03-25 DIAGNOSIS — R7302 Impaired glucose tolerance (oral): Secondary | ICD-10-CM

## 2012-03-25 DIAGNOSIS — J309 Allergic rhinitis, unspecified: Secondary | ICD-10-CM

## 2012-03-25 DIAGNOSIS — R7309 Other abnormal glucose: Secondary | ICD-10-CM

## 2012-03-25 DIAGNOSIS — Z0000 Encounter for general adult medical examination without abnormal findings: Secondary | ICD-10-CM

## 2012-03-25 DIAGNOSIS — M545 Low back pain: Secondary | ICD-10-CM

## 2012-03-25 DIAGNOSIS — E039 Hypothyroidism, unspecified: Secondary | ICD-10-CM

## 2012-03-25 MED ORDER — DICLOFENAC SODIUM 75 MG PO TBEC: 75.0000 mg | DELAYED_RELEASE_TABLET | Freq: Two times a day (BID) | ORAL | Status: DC | PRN

## 2012-03-25 MED ORDER — FLUTICASONE PROPIONATE 50 MCG/ACT NA SUSP: 2.0000 | Freq: Every day | NASAL | Status: DC

## 2012-03-25 MED ORDER — HYDROCODONE-ACETAMINOPHEN 5-500 MG PO TABS: 1.0000 | ORAL_TABLET | Freq: Four times a day (QID) | ORAL | Status: DC | PRN

## 2012-03-25 NOTE — Progress Notes (Signed)
Subjective:    Patient ID: Walter Jordan, male    DOB: [birth year], 46 y.o.   MRN: [id number]  HPI Subjective:    Patient ID: Walter Jordan, male    DOB: 1965/12/13, 46 y.o.   MRN: [id number]  HPI  45 -year-old patient who is seen today for a preventive health examination. Medical issues include obstructive sleep apnea. He has a history of allergic rhinitis low back pain. He did have a screening colonoscopy in 2009  due to rectal bleeding. He has done quite well today. No concerns or complaints his neck and back pain are fairly stable he did have a lumbar MRI earlier in the year. Laboratory studies were reviewed and revealed a fasting blood sugar of 110    Review of Systems  Constitutional: Negative for fever, chills, activity change, appetite change and fatigue.  HENT: Negative for hearing loss, ear pain, congestion, rhinorrhea, sneezing, mouth sores, trouble swallowing, neck pain, neck stiffness, dental problem, voice change, sinus pressure and tinnitus.   Eyes: Negative for photophobia, pain, redness and visual disturbance.  Respiratory: Negative for apnea, cough, choking, chest tightness, shortness of breath and wheezing.   Cardiovascular: Negative for chest pain, palpitations and leg swelling.  Gastrointestinal: Negative for nausea, vomiting, abdominal pain, diarrhea, constipation, blood in stool, abdominal distention, anal bleeding and rectal pain.  Genitourinary: Negative for dysuria, urgency, frequency, hematuria, flank pain, decreased urine volume, discharge, penile swelling, scrotal swelling, difficulty urinating, genital sores and testicular pain.  Musculoskeletal: Positive for back pain. Negative for myalgias, joint swelling, arthralgias and gait problem.  Skin: Negative for color change, rash and wound.  Neurological: Negative for dizziness, tremors, seizures, syncope, facial asymmetry, speech difficulty, weakness, light-headedness, numbness and headaches.  Hematological: Negative for  adenopathy. Does not bruise/bleed easily.  Psychiatric/Behavioral: Negative for suicidal ideas, hallucinations, behavioral problems, confusion, sleep disturbance, self-injury, dysphoric mood, decreased concentration and agitation. The patient is not nervous/anxious.        Objective:   Physical Exam  Constitutional: He appears well-developed and well-nourished.  HENT:  Head: Normocephalic and atraumatic.  Right Ear: External ear normal.  Left Ear: External ear normal.  Nose: Nose normal.  Mouth/Throat: Oropharynx is clear and moist.  Eyes: Conjunctivae and EOM are normal. Pupils are equal, round, and reactive to light. No scleral icterus.  Neck: Normal range of motion. Neck supple. No JVD present. No thyromegaly present.  Cardiovascular: Regular rhythm, normal heart sounds and intact distal pulses.  Exam reveals no gallop and no friction rub.   No murmur heard. Pulmonary/Chest: Effort normal and breath sounds normal. He exhibits no tenderness.  Abdominal: Soft. Bowel sounds are normal. He exhibits no distension and no mass. There is no tenderness.  Genitourinary: Prostate normal and penis normal.  Musculoskeletal: Normal range of motion. He exhibits no edema and no tenderness.  Lymphadenopathy:    He has no cervical adenopathy.  Neurological: He is alert. He has normal reflexes. No cranial nerve deficit. Coordination normal.  Skin: Skin is warm and dry. No rash noted.  Psychiatric: He has a normal mood and affect. His behavior is normal.          Assessment & Plan:    Preventive health examination Obstructive sleep apnea  Medical regimen unchanged medications refilled. Will return here in one year or as needed.  Wt Readings from Last 3 Encounters:  03/25/12 241 lb (109.317 kg)  11/14/11 238 lb (107.956 kg)  08/02/11 240 lb (108.863 kg)    Review of Systems  Objective:   Physical Exam        Assessment & Plan:   Impaired glucose tolerance Preventive  health examination  Patient is given dietary instructions today. Exercise weight loss all encouraged. Recheck 1 year or as needed

## 2012-03-25 NOTE — Patient Instructions (Signed)
You need to lose weight.  Consider a lower calorie diet and regular exercise.    It is important that you exercise regularly, at least 20 minutes 3 to 4 times per week.  If you develop chest pain or shortness of breath seek  medical attention.  Return in one year for follow-up 2400 Calorie Diet for Diabetes Meal Planning The 2400 calorie diet is designed for eating up to 2400 calories each day. Following this diet and making healthy meal choices can help improve overall health. This diet controls blood sugar (glucose) levels and can also lower blood pressure and cholesterol.   SERVING SIZES Measuring foods and serving sizes helps to make sure you are getting the right amount of food. The list below tells how big or small some common serving sizes are.  1 oz.........4 stacked dice.   3 oz........Marland KitchenDeck of cards.   1 tsp.......Marland KitchenTip of little finger.   1 tbs......Marland KitchenMarland KitchenThumb.   2 tbs.......Marland KitchenGolf ball.    cup......Marland KitchenHalf of a fist.   1 cup.......Marland KitchenA fist.  GUIDELINES FOR CHOOSING FOODS The goal of this diet is to eat a variety of foods and limit calories to 2400 each day. This can be done by choosing foods that are low in calories and in fat. The diet also suggests eating small amounts of food often. Doing this helps control your blood glucose levels so they do not get too high or low. Each meal or snack should contain a protein food source to help you feel more satisfied and to stabilize your blood glucose. Try to eat about the same amount of food around the same time each day. This includes weekend days, travel days, and days off work. Space your meals about 4 to 5 hours apart and add a snack between them if you wish.   For example, a daily food plan could include breakfast, a morning snack, lunch, dinner, and an evening snack. Healthy meals and snacks include whole grains, vegetables, fruits, lean meats, poultry, fish, and dairy products. As you plan your meals, choose a variety of foods. Choose  from the bread and starches, vegetables, fruit, dairy, and meat/protein groups. Examples of foods from each group are listed below with their suggested serving sizes. Use measuring cups and spoons to become familiar with what a healthy portion looks like. Bread and Starch Each serving equals 15 grams of carbohydrates.  1 slice bread.    bagel.    cup cold cereal (unsweetened).    cup hot cereal or mashed potatoes.   1 small potato (size of a computer mouse).   cup cooked pasta or rice.    English muffin.   1 cup broth-based soup.   3 cups of popcorn.   4 to 6 whole-wheat crackers.    cup cooked beans, peas, or corn.  Vegetable Each serving equals 5 grams of carbohydrates.   cup cooked vegetables.   1 cup raw vegetables.    cup tomato or vegetable juice.  Fruit Each serving equals 15 grams of carbohydrates.  1 small apple or orange.   1 cup watermelon or strawberries.    cup applesauce (no sugar added).   2 tbs raisins.    banana.    cup canned fruit, packed in water, in its own juice, or sweetened with a sugar substitute.    cup unsweetened fruit juice.  Dairy Each serving equals 12 to 15 grams of carbohydrates.  1 cup fat-free milk.   6 oz artificially sweetened yogurt or plain yogurt.  1 cup low-fat buttermilk.   1 cup soy milk.  Meat/Protein  1 large egg.   2 to 3 oz meat, poultry, or fish.    cup low-fat cottage cheese.   1 tbs peanut butter.    cup tofu.   1 oz low-fat cheese.    cup canned tuna in water.  Fat  1 tsp oil.   1 tsp trans-fat-free margarine.   1 tsp butter.   1 tsp mayonnaise.   2 tbs avocado.  SAMPLE 2400 CALORIE DIET PLAN Breakfast  1 English muffin (2 carb servings).   1 scrambled egg.   1 tsp margarine.   1 cup fat-free milk (1 carb serving).   1 large orange (2 carb servings).  Morning Snack   cup low-fat cottage cheese.    cup canned peaches in juice (1 carb serving).   1  cup carrot sticks.  Lunch  Grilled chicken salad.   2 oz chicken breast.   1 cup romaine lettuce or spinach.    cup diced tomato.    cup shredded carrots.    cup sliced cucumbers.   2 tbs low-fat salad dressing.   2 slices whole-wheat bread (2 carb servings).   1 small apple (1 carb serving).   1 cup fat-free milk (1 carb serving).   15 baked chips ( 1 carb serving).  Afternoon Snack  8 reduced fat crackers (2 carb servings).   2 tbs peanut butter.  Dinner  3 oz salmon, broiled.   4 small red potatoes, roasted with 1 tsp olive oil and seasoning (3 carb servings).   1 cup green beans.   1 cup strawberries (1 carb serving).   1 cup fat-free milk (1 carb serving).  Evening Snack  6 cups air-popped popcorn (2 carb servings).   2 tbs parmesan cheese sprinkled on top.   8 almonds.  MEAL PLAN Use this worksheet to help you make a daily meal plan based on the 2400 calorie diet suggestions. The total amount of carbohydrates in your meal or snack is more important than making sure you include all of the food groups at every meal or snack. If you are using this plan to help you control your blood glucose, you may interchange carbohydrate-containing foods (dairy, starches, and fruits). Choose a variety of fresh foods of varying colors and flavors. You can choose from the following foods to build your day's meals:  12 Starches.   4 Vegetables.   4 Fruits.   3 Dairy.   7 oz Meat/Protein.   Up to 8 Fats.  Your dietician can use this worksheet to help you decide how many servings and what types of foods are right for you. BREAKFAST Food Group and Servings / Food Choice Starch ____________________________________________________ Dairy _____________________________________________________ Fruit _____________________________________________________ Meat/Protein  ______________________________________________ Fat________________________________________________________ LUNCH Food Group and Servings / Food Choice  Starch ___________________________________________________ Meat/Protein _____________________________________________ Vegetable ________________________________________________ Fruit _____________________________________________________ Dairy ____________________________________________________ Fat_______________________________________________________ Aura Fey Food Group and Servings / Food Choice Starch ___________________________________________________ Meat/Protein _____________________________________________ Laural Golden Food Group and Servings / Food Choice Starch ___________________________________________________ Meat/Protein _____________________________________________ Dairy ____________________________________________________ Vegetable ________________________________________________ Fruit _____________________________________________________ Fat_______________________________________________________ Lollie Sails Food Group and Servings / Food Choice Fruit _____________________________________________________ Meat/Protein ______________________________________________ Starch ____________________________________________________ DAILY TOTALS Starch __________________________ Vegetable _______________________ Fruits ___________________________ Dairy ___________________________ Meat/Protein ____________________ Fat _____________________________ Document Released: 11/28/2004 Document Revised: 07/31/2011 Document Reviewed: 03/24/2011 [employer] Patient Information 2013 [place], [place].   Diabetes and Exercise Regular exercise is important and can help:    Control blood glucose (sugar).   Decrease  blood pressure.     Control blood lipids (cholesterol, triglycerides).   Improve overall health.  BENEFITS FROM  EXERCISE  Improved fitness.   Improved flexibility.   Improved endurance.   Increased bone density.   Weight control.   Increased muscle strength.   Decreased body fat.   Improvement of the body's use of insulin, a hormone.   Increased insulin sensitivity.   Reduction of insulin needs.   Reduced stress and tension.   Helps you feel better.  People with diabetes who add exercise to their lifestyle gain additional benefits, including:  Weight loss.   Reduced appetite.   Improvement of the body's use of blood glucose.   Decreased risk factors for heart disease:   Lowering of cholesterol and triglycerides.   Raising the level of good cholesterol (high-density lipoproteins, HDL).   Lowering blood sugar.   Decreased blood pressure.  TYPE 1 DIABETES AND EXERCISE  Exercise will usually lower your blood glucose.   If blood glucose is greater than 240 mg/dl, check urine ketones. If ketones are present, do not exercise.   Location of the insulin injection sites may need to be adjusted with exercise. Avoid injecting insulin into areas of the body that will be exercised. For example, avoid injecting insulin into:   The arms when playing tennis.   The legs when jogging. For more information, discuss this with your caregiver.   Keep a record of:   Food intake.   Type and amount of exercise.   Expected peak times of insulin action.   Blood glucose levels.  Do this before, during, and after exercise. Review your records with your caregiver. This will help you to develop guidelines for adjusting food intake and insulin amounts.   TYPE 2 DIABETES AND EXERCISE  Regular physical activity can help control blood glucose.   Exercise is important because it may:   Increase the body's sensitivity to insulin.   Improve blood glucose control.   Exercise reduces the risk of heart disease. It decreases serum cholesterol and triglycerides. It also lowers blood pressure.    Those who take insulin or oral hypoglycemic agents should watch for signs of hypoglycemia. These signs include dizziness, shaking, sweating, chills, and confusion.   Body water is lost during exercise. It must be replaced. This will help to avoid loss of body fluids (dehydration) or heat stroke.  Be sure to talk to your caregiver before starting an exercise program to make sure it is safe for you. Remember, any activity is better than none.   Document Released: 07/29/2003 Document Revised: 07/31/2011 Document Reviewed: 11/12/2008 [facility] Patient Information 2013 [place], [place].   Diabetes Meal Planning Guide The diabetes meal planning guide is a tool to help you plan your meals and snacks. It is important for people with diabetes to manage their blood glucose (sugar) levels. Choosing the right foods and the right amounts throughout your day will help control your blood glucose. Eating right can even help you improve your blood pressure and reach or maintain a healthy weight. CARBOHYDRATE COUNTING MADE EASY When you eat carbohydrates, they turn to sugar. This raises your blood glucose level. Counting carbohydrates can help you control this level so you feel better. When you plan your meals by counting carbohydrates, you can have more flexibility in what you eat and balance your medicine with your food intake. Carbohydrate counting simply means adding up the total amount of carbohydrate grams in your meals and snacks. Try to eat about the same  amount at each meal. Foods with carbohydrates are listed below. Each portion below is 1 carbohydrate serving or 15 grams of carbohydrates. Ask your dietician how many grams of carbohydrates you should eat at each meal or snack. Grains and Starches  1 slice bread.    English muffin or hotdog/hamburger bun.    cup cold cereal (unsweetened).   cup cooked pasta or rice.    cup starchy vegetables (corn, potatoes, peas, beans, winter squash).   1  tortilla (6 inches).    bagel.   1 waffle or pancake (size of a CD).    cup cooked cereal.   4 to 6 small crackers.  *Whole grain is recommended. Fruit  1 cup fresh unsweetened berries, melon, papaya, pineapple.   1 small fresh fruit.    banana or mango.    cup fruit juice (4 oz unsweetened).    cup canned fruit in natural juice or water.   2 tbs dried fruit.   12 to 15 grapes or cherries.  Milk and Yogurt  1 cup fat-free or 1% milk.   1 cup soy milk.   6 oz light yogurt with sugar-free sweetener.   6 oz low-fat soy yogurt.   6 oz plain yogurt.  Vegetables  1 cup raw or  cup cooked is counted as 0 carbohydrates or a "free" food.   If you eat 3 or more servings at 1 meal, count them as 1 carbohydrate serving.  Other Carbohydrates   oz chips or pretzels.    cup ice cream or frozen yogurt.    cup sherbet or sorbet.   2 inch square cake, no frosting.   1 tbs honey, sugar, jam, jelly, or syrup.   2 small cookies.   3 squares of graham crackers.   3 cups popcorn.   6 crackers.   1 cup broth-based soup.   Count 1 cup casserole or other mixed foods as 2 carbohydrate servings.   Foods with less than 20 calories in a serving may be counted as 0 carbohydrates or a "free" food.  You may want to purchase a book or computer software that lists the carbohydrate gram counts of different foods. In addition, the nutrition facts panel on the labels of the foods you eat are a good source of this information. The label will tell you how big the serving size is and the total number of carbohydrate grams you will be eating per serving. Divide this number by 15 to obtain the number of carbohydrate servings in a portion. Remember, 1 carbohydrate serving equals 15 grams of carbohydrate. SERVING SIZES Measuring foods and serving sizes helps you make sure you are getting the right amount of food. The list below tells how big or small some common serving sizes  are.  1 oz.........4 stacked dice.   3 oz........Marland KitchenDeck of cards.   1 tsp.......Marland KitchenTip of little finger.   1 tbs......Marland KitchenMarland KitchenThumb.   2 tbs.......Marland KitchenGolf ball.    cup......Marland KitchenHalf of a fist.   1 cup.......Marland KitchenA fist.  SAMPLE DIABETES MEAL PLAN Below is a sample meal plan that includes foods from the grain and starches, dairy, vegetable, fruit, and meat groups. A dietician can individualize a meal plan to fit your calorie needs and tell you the number of servings needed from each food group. However, controlling the total amount of carbohydrates in your meal or snack is more important than making sure you include all of the food groups at every meal. You may interchange carbohydrate containing foods (dairy,  starches, and fruits). The meal plan below is an example of a 2000 calorie diet using carbohydrate counting. This meal plan has 17 carbohydrate servings. Breakfast  1 cup oatmeal (2 carb servings).    cup light yogurt (1 carb serving).   1 cup blueberries (1 carb serving).    cup almonds.  Snack  1 large apple (2 carb servings).   1 low-fat string cheese stick.  Lunch  Chicken breast salad.   1 cup spinach.    cup chopped tomatoes.   2 oz chicken breast, sliced.   2 tbs low-fat [place] dressing.   12 whole-wheat crackers (2 carb servings).   12 to 15 grapes (1 carb serving).   1 cup low-fat milk (1 carb serving).  Snack  1 cup carrots.    cup hummus (1 carb serving).  Dinner  3 oz broiled salmon.   1 cup brown rice (3 carb servings).  Snack  1  cups steamed broccoli (1 carb serving) drizzled with 1 tsp olive oil and lemon juice.   1 cup light pudding (2 carb servings).  DIABETES MEAL PLANNING WORKSHEET Your dietician can use this worksheet to help you decide how many servings of foods and what types of foods are right for you.   BREAKFAST Food Group and Servings / Carb Servings Grain/Starches __________________________________ Dairy  __________________________________________ Vegetable ______________________________________ Fruit ___________________________________________ Meat __________________________________________ Fat ____________________________________________ LUNCH Food Group and Servings / Carb Servings Grain/Starches ___________________________________ Dairy ___________________________________________ Fruit ____________________________________________ Meat ___________________________________________ Fat _____________________________________________ Laural Golden Food Group and Servings / Carb Servings Grain/Starches ___________________________________ Dairy ___________________________________________ Fruit ____________________________________________ Meat ___________________________________________ Fat _____________________________________________ SNACKS Food Group and Servings / Carb Servings Grain/Starches ___________________________________ Dairy ___________________________________________ Vegetable _______________________________________ Fruit ____________________________________________ Meat ___________________________________________ Fat _____________________________________________ DAILY TOTALS Starches _________________________ Vegetable ________________________ Fruit ____________________________ Dairy ____________________________ Meat ____________________________ Fat ______________________________ Document Released: 02/02/2005 Document Revised: 07/31/2011 Document Reviewed: 12/14/2008 [employer] Patient Information 2013 [place], [place].   Diabetes, Eating Away From Home Sometimes, you might eat in a restaurant or have meals that are prepared by someone else. You can enjoy eating out. However, the portions in restaurants may be much larger than needed. Listed below are some ideas to help you choose foods that will keep your blood glucose (sugar) in better control.   TIPS FOR EATING OUT  Know your  meal plan and how many carbohydrate servings you should have at each meal. You may wish to carry a copy of your meal plan in your purse or wallet. Learn the foods included in each food group.   Make a list of restaurants near you that offer healthy choices. Take a copy of the carry-out menus to see what they offer. Then, you can plan what you will order ahead of time.   Become familiar with serving sizes by practicing them at home using measuring cups and spoons. Once you learn to recognize portion sizes, you will be able to correctly estimate the amount of total carbohydrate you are allowed to eat at the restaurant. Ask for a takeout box if the portion is more than you should have. When your food comes, leave the amount you should have on the plate, and put the rest in the takeout box before you start eating.   Plan ahead if your mealtime will be different from usual. Check with your caregiver to find out how to time meals and medicine if you are taking insulin.   Avoid high-fat foods, such as fried foods, cream sauces, high-fat salad dressings, or any added butter or margarine.  Do not be afraid to ask questions. Ask your server about the portion size, cooking methods, ingredients and if items can be substituted. Restaurants do not list all available items on the menu. You can ask for your main entree to be prepared using skim milk, oil instead of butter or margarine, and without gravy or sauces. Ask your waiter or waitress to serve salad dressings, gravy, sauces, margarine, and sour cream on the side. You can then add the amount your meal plan suggests.   Add more vegetables whenever possible.   Avoid items that are labeled "jumbo," "giant," "deluxe," or "supersized."   You may want to split an entre with someone and order an extra side salad.   Watch for hidden calories in foods like croutons, bacon, or cheese.   Ask your server to take away the bread basket or chips from your table.    Order a dinner salad as an appetizer.  You can eat most foods served in a restaurant. Some foods are better choices than others. Breads and Starches  Recommended: All kinds of bread (wheat, rye, white, oatmeal, [place], [place], raisin), hard or soft dinner rolls, frankfurter or hamburger buns, small bagels, small corn or whole-wheat flour tortillas.   Avoid: Frosted or glazed breads, butter rolls, egg or cheese breads, croissants, sweet rolls, pastries, coffee cake, glazed or frosted doughnuts, muffins.  Crackers  Recommended: Animal crackers, graham, rye, saltine, oyster, and matzoth crackers. Bread sticks, melba toast, rusks, pretzels, popcorn (without fat), zwieback toast.   Avoid: High-fat snack crackers or chips. Buttered popcorn.  Cereals  Recommended: Hot and cold cereals. Whole grains such as oatmeal or shredded wheat are good choices.   Avoid: Sugar-coated or granola type cereals.  Potatoes/Pasta/Rice/Beans  Recommended: Order baked, boiled, or mashed potatoes, rice or noodles without added fat, whole beans. Order gravies, butter, margarine, or sauces on the side so you can control the amount you add.   Avoid: Hash browns or fried potatoes. Potatoes, pasta, or rice prepared with cream or cheese sauce. Potato or pasta salads prepared with large amounts of dressing. Fried beans or fried rice.  Vegetables  Recommended: Order steamed, baked, boiled, or stewed vegetables without sauces or extra fat. Ask that sauce be served on the side. If vegetables are not listed on the menu, ask what is available.   Avoid: Vegetables prepared with cream, butter, or cheese sauce. Fried vegetables.  Salad Bars  Recommended: Many of the vegetables at a salad bar are considered "free." Use lemon juice, vinegar, or low-calorie salad dressing (fewer than 20 calories per serving) as "free" dressings for your salad. Look for salad bar ingredients that have no added fat or sugar such as tomatoes,  lettuce, cucumbers, broccoli, carrots, onions, and mushrooms.   Avoid: Prepared salads with large amounts of dressing, such as coleslaw, caesar salad, macaroni salad, bean salad, or carrot salad.  Fruit  Recommended: Eat fresh fruit or fresh fruit salad without added dressing. A salad bar often offers fresh fruit choices, but canned fruit at a restaurant is usually packed in sugar or syrup.   Avoid: Sweetened canned or frozen fruits, plain or sweetened fruit juice. Fruit salads with dressing, sour cream, or sugar added to them.  Meat and Meat Substitutes  Recommended: Order broiled, baked, roasted, or grilled meat, poultry, or fish. Trim off all visible fat. Do not eat the skin of poultry. The size stated on the menu is the raw weight. Meat shrinks by  in cooking (for example, 4  oz raw equals 3 oz cooked meat).   Avoid: Deep-fat fried meat, poultry, or fish. Breaded meats.  Eggs  Recommended: Order soft, hard-cooked, poached, or scrambled eggs. Omelets may be okay, depending on what ingredients are added. Egg substitutes are also a good choice.   Avoid: Fried eggs, eggs prepared with cream or cheese sauce.  Milk  Recommended: Order low-fat or fat-free milk according to your meal plan. Plain, nonfat yogurt or flavored yogurt with no sugar added may be used as a substitute for milk. Soy milk may also be used.   Avoid: Milk shakes or sweetened milk beverages.  Soups and Combination Foods  Recommended: Clear broth or consomm are "free" foods and may be used as an appetizer. Broth-based soups with fat removed count as a starch serving and are preferred over cream soups. Soups made with beans or split peas may be eaten but count as a starch.   Avoid: Fatty soups, soup made with cream, cheese soup. Combination foods prepared with excessive amounts of fat or with cream or cheese sauces.  Desserts and Sweets  Recommended: Ask for fresh fruit. Sponge or angel food cake without icing, ice milk,  no sugar added ice cream, sherbet, or frozen yogurt may fit into your meal plan occasionally.   Avoid: Pastries, puddings, pies, cakes with icing, custard, gelatin desserts.  Fats and Oils  Recommended: Choose healthy fats such as olive oil, canola oil, or tub margarine, reduced fat or fat-free sour cream, cream cheese, avocado, or nuts.   Avoid: Any fats in excess of your allowed portion. Deep-fried foods or any food with a large amount of fat.  Note: Ask for all fats to be served on the side, and limit your portion sizes according to your meal plan. Document Released: 05/08/2005 Document Revised: 07/31/2011 Document Reviewed: 11/26/2008 [facility] Patient Information 2013 [place], [place].   How to Increase Fiber in the Meal Plan for Diabetes Increasing fiber in the diet has many benefits including lowering blood cholesterol, helping to control blood glucose (sugar), preventing constipation, and aiding in weight management by helping you feel full longer. Start adding fiber to your diet slowly. A gradual substitution of high fiber foods for low fiber foods will allow the digestive tract to adjust. Most men under 88 years of age should aim to eat 38 g of fiber a day. Women should aim for 25 g. Over 8 years of age, most men need 30 g of fiber and most women need 21 g. Below are some suggestions for increasing fiber.  Try whole-wheat bread instead of white bread. Look for words high on the list of ingredients such as whole wheat, whole rye, or whole oats.   Try baked potato with skin instead of mashed potatoes.   Try a fresh apple with skin instead of applesauce.   Try a fresh orange instead of orange juice.   Try popcorn instead of potato chips.   Try bran cereal instead of corn flakes.   Try kidney, whole pinto, or garbanzo beans instead of bread.   Try whole-grain crackers instead of saltine crackers.   Try whole-wheat pasta instead of regular varieties.   While on a high fiber diet,  drink enough water and fluids to keep your urine clear or pale yellow.   Eat a variety of high fiber foods such as fruits, vegetables, whole grains, nuts, and seeds.   Try to increase your intake of fiber by eating high fiber foods instead of taking fiber pills or supplements  that contain small amounts of fiber. There can be additional benefits for long-term health and blood glucose control with high fiber foods. Aim for 5 servings of fruit and vegetables per day.  SOURCES OF FIBER The following list shows the average dietary fiber for types of food in the various food groups. Starch and Bread / Dietary Fiber (g)  Whole-grain breads, 1 slice / 2 g   Whole grain,  cup / 2 g   Whole-grain cereals,  cup / 3 g   Bran cereals,  to  cup / 8 g   Starchy vegetables,  cup / 3 g   Legumes (beans, peas, lentils),  cup / 8 g   Oatmeal,  cup / 2 g   Whole-wheat pasta,  cup / 2 g   Brown rice,  cup / 2 g   Barley,  cup / 3 g  Meat and Meat Substitutes / Dietary Fiber (g) This group averages 0 grams of fiber. Exceptions are:  Nuts, seeds, 1 oz or  cup / 3 g   Chunky peanut butter, 2 tbs / 3 g  Vegetables / Dietary Fiber (g)  Cooked vegetables,  to  cup / 2 to 3 g   Raw vegetables, 1 to 2 cups / 2 to 3 g  Fruit / Dietary Fiber (g)  Raw or cooked fruit,  cup or 1 small, fresh piece / 2 g  Milk / Dietary Fiber (g)  Milk, 1 cup or 8 oz / 0 g  Fats and Oils / Dietary Fiber (g)  Fats and oils, 1 tsp / 0 g  You can determine how much fiber you are eating by reading the Nutrition Facts panel on the labels of the foods you eat. FIBER IN SPECIFIC FOODS Cereals / Dietary Fiber (g)  All Bran,  cup / 9 g   All Bran with Extra Fiber,  cup / 13 g   Bran Flakes,  cup / 4 g   Cheerios,  cup / 1.5 g   Corn Bran,  cup / 4 g   Corn Flakes,  cup / 0.75 g   Cracklin' Oat Bran,  cup / 4 g   Fiber One,  cup / 13 g   Grape Nuts, 3 tbs / 3 g   Grape Nuts  Flakes,  cup / 3 g   Noodles,  cup, cooked / 0.5 g   Nutrigrain Wheat,  cup / 3.5 g   Oatmeal,  cup, cooked / 1.1 g   Pasta, white (macaroni, spaghetti),  cup, cooked / 0.5 g   Pasta, whole-wheat (macaroni, spaghetti),  cup, cooked / 2 g   [place],  cup, cooked / 3 g   Rice, wild,  cup, cooked / 0.5 g   Rice, brown,  cup, cooked / 1 g   Rice, white,  cup, cooked / 0.2 g   Shredded Wheat, bite-sized,  cup / 2 g   Total,  cup / 1.75 g   Wheat Chex,  cup / 2.5 g   Wheatena,  cup, cooked / 4 g   Wheaties,  cup / 2.75 g  Bread, Starchy Vegetables, and Dried Peas and Beans / Dietary Fiber (g)  Bagel, whole / 0.6 g   Baked beans in tomato sauce,  cup, cooked / 3 g   Bran muffin, 1 small / 2.5 g   Bread, cracked wheat, 1 slice / 2.5 g   Bread, pumpernickel, 1 slice / 2.5 g   Bread, white,  1 slice / 0.4 g   Bread, whole-wheat, 1 slice / 1.4 g   Corn,  cup, canned / 2.9 g   Kidney beans,  cup, cooked / 3.5 g   Lentils, cup, cooked / 3 g   Lima beans,  cup, cooked / 4 g   Navy beans,  cup, cooked / 4 g   Peas,  cup, cooked / 4 g   Popcorn, 3 cups popped, unbuttered / 3.5 g   Potato, baked (with skin), 1 small / 4 g   Potato, baked (without skin), 1 small / 2 g   Ry-Krisp, 4 crackers / 3 g   Saltine crackers, 6 squares / 0 g   Split peas,  cup, cooked / 2.5 g   Yams (sweet potato),  cup / 1.7 g  Fruit / Dietary Fiber (g)  Apple, 1 small, fresh, with skin / 4 g   Apple juice,  cup / 0.4 g   Apricots, 4 medium, fresh / 4 g   Apricots, 7 halves, dried / 2 g   Banana,  medium / 1.2 g   Blueberries,  cup / 2 g   Cantaloupe, melon / 1.3 g   Cherries,  cup, canned / 1.4 g   Grapefruit,  medium / 1.6 g   Grapes, 15 small / 1.2 g   Grape juice,  cup / 0.5 g   Orange, 1 medium, fresh / 2 g   Orange juice,  cup / 0.5 g   Peach, 1 medium,fresh, with skin / 2 g   Pear, 1 medium, fresh, with skin / 4 g    Pineapple, cup, canned / 0.7 g   Plums, 2 whole / 2 g   Prunes, 3 whole / 1.5 g   Raspberries, 1 cup / 6 g   Strawberries, 1  cup / 4 g   Watermelon, 1  cup / 0.5 g  Vegetables / Dietary Fiber (g)  Asparagus,  cup, cooked / 1 g   Beans, green and wax,  cup, cooked / 1.6 g   Beets,  cup, cooked / 1.8 g   Broccoli,  cup, cooked / 2.2 g   Brussels sprouts,  cup, cooked / 4 g   Cabbage,  cup, cooked / 2.5 g   Carrots,  cup, cooked / 2.3 g   Cauliflower,  cup, cooked / 1.1 g   Celery, 1 cup, raw / 1.5 g   Cucumber, 1 cup, raw / 0.8 g   Green pepper,  cup sliced, cooked / 1.5 g   Lettuce, 1 cup, sliced / 0.9 g   Mushrooms, 1 cup sliced, raw / 1.8 g   Onion, 1 cup sliced, raw / 1.6 g   Spinach,  cup, cooked / 2.4 g   Tomato, 1 medium, fresh / 1.5 g   Tomato juice,  cup / 0 g   Zucchini,  cup, cooked / 1.8 g  Document Released: 10/28/2001 Document Revised: 07/31/2011 Document Reviewed: 11/24/2008 [facility] Patient Information 2013 [place], [place].   Diets for Diabetes, Food Labeling Look at food labels to help you decide how much of a product you can eat. You will want to check the amount of total carbohydrate in a serving to see how the food fits into your meal plan. In the list of ingredients, the ingredient present in the largest amount by weight must be listed first, followed by the other ingredients in descending order. STANDARD OF IDENTITY Most products have a list  of ingredients. However, foods that the Food and Drug Administration (FDA) has given a standard of identity do not need a list of ingredients. A standard of identity means that a food must contain certain ingredients if it is called a particular name. Examples are mayonnaise, peanut butter, ketchup, jelly, and cheese. LABELING TERMS There are many terms found on food labels. Some of these terms have specific definitions. Some terms are regulated by the FDA, and the FDA has clearly  specified how they can be used. Others are not regulated or well-defined and can be misleading and confusing. SPECIFICALLY DEFINED TERMS Nutritive Sweetener.  A sweetener that contains calories,such as table sugar or honey.  Nonnutritive Sweetener.  A sweetener with few or no calories,such as saccharin, aspartame, sucralose, and cyclamate.  LABELING TERMS REGULATED BY THE FDA Free.  The product contains only a tiny or small amount of fat, cholesterol, sodium, sugar, or calories. For example, a "fat-free" product will contain less than 0.5 g of fat per serving.  Low.  A food described as "low" in fat, saturated fat, cholesterol, sodium, or calories could be eaten fairly often without exceeding dietary guidelines. For example, "low in fat" means no more than 3 g of fat per serving.  Lean.  "Lean" and "extra lean" are U.S. Department of Agriculture Architect) terms for use on meat and poultry products. "Lean" means the product contains less than 10 g of fat, 4 g of saturated fat, and 95 mg of cholesterol per serving. "Lean" is not as low in fat as a product labeled "low."  Extra Lean.  "Extra lean" means the product contains less than 5 g of fat, 2 g of saturated fat, and 95 mg of cholesterol per serving. While "extra lean" has less fat than "lean," it is still higher in fat than a product labeled "low."  Reduced, Less, Fewer.  A diet product that contains 25% less of a nutrient or calories than the regular version. For example, hot dogs might be labeled "25% less fat than our regular hot dogs."  Light/Lite.  A diet product that contains  fewer calories or  the fat of the original. For example, "light in sodium" means a product with  the usual sodium.  More.  One serving contains at least 10% more of the daily value of a vitamin, mineral, or fiber than usual.  Good Source Of.  One serving contains 10% to 19% of the daily value for a particular vitamin, mineral, or fiber.  Excellent Source  Of.  One serving contains 20% or more of the daily value for a particular nutrient. Other terms used might be "high in" or "rich in."  Enriched or Fortified.  The product contains added vitamins, minerals, or protein. Nutrition labeling must be used on enriched or fortified foods.  Imitation.  The product has been altered so that it is lower in protein, vitamins, or minerals than the usual food,such as imitation peanut butter.  Total Fat.  The number listed is the total of all fat found in a serving of the product. Under total fat, food labels must list saturated fat and trans fat, which are associated with raising bad cholesterol and an increased risk of heart blood vessel disease.  Saturated Fat.  Mainly fats from animal-based sources. Some examples are red meat, cheese, cream, whole milk, and coconut oil.  Trans Fat.  Found in some fried snack foods, packaged foods, and fried restaurant foods. It is recommended you eat as close to 0  g of trans fat as possible, since it raises bad cholesterol and lowers good cholesterol.  Polyunsaturated and Monounsaturated Fats.  More healthful fats. These fats are from plant sources.  Total Carbohydrate.  The number of carbohydrate grams in a serving of the product. Under total carbohydrate are listed the other carbohydrate sources, such as dietary fiber and sugars.  Dietary Fiber.  A carbohydrate from plant sources.  Sugars.  Sugars listed on the label contain all naturally occurring sugars as well as added sugars.  LABELING TERMS NOT REGULATED BY THE FDA Sugarless.  Table sugar (sucrose) has not been added. However, the manufacturer may use another form of sugar in place of sucrose to sweeten the product. For example, sugar alcohols are used to sweeten foods. Sugar alcohols are a form of sugar but are not table sugar. If a product contains sugar alcohols in place of sucrose, it can still be labeled "sugarless."  Low Salt, Salt-Free, Unsalted,  No Salt, No Salt Added, Without Added Salt.  Food that is usually processed with salt has been made without salt. However, the food may contain sodium-containing additives, such as preservatives, leavening agents, or flavorings.  Natural.  This term has no legal meaning.  Organic.  Foods that are certified as organic have been inspected and approved by the USDA to ensure they are produced without pesticides, fertilizers containing synthetic ingredients, bioengineering, or ionizing radiation.  Document Released: 05/11/2003 Document Revised: 07/31/2011 Document Reviewed: 11/26/2008 [facility] Patient Information 2013 [place], [place].

## 2012-04-30 ENCOUNTER — Ambulatory Visit (INDEPENDENT_AMBULATORY_CARE_PROVIDER_SITE_OTHER): Payer: 59 | Admitting: Internal Medicine

## 2012-04-30 ENCOUNTER — Other Ambulatory Visit: Payer: Self-pay | Admitting: Internal Medicine

## 2012-04-30 ENCOUNTER — Encounter: Payer: Self-pay | Admitting: Internal Medicine

## 2012-04-30 VITALS — BP 134/92 | Temp 102.1°F | Wt 236.0 lb

## 2012-04-30 DIAGNOSIS — R1013 Epigastric pain: Secondary | ICD-10-CM

## 2012-04-30 DIAGNOSIS — R509 Fever, unspecified: Secondary | ICD-10-CM

## 2012-04-30 LAB — CBC WITH DIFFERENTIAL/PLATELET
Basophils Absolute: 0 10*3/uL (ref 0.0–0.1)
Basophils Relative: 0 % (ref 0–1)
Eosinophils Relative: 0 % (ref 0–5)
HCT: 47.2 % (ref 39.0–52.0)
Lymphocytes Relative: 4 % — ABNORMAL LOW (ref 12–46)
MCHC: 35 g/dL (ref 30.0–36.0)
MCV: 89.1 fL (ref 78.0–100.0)
Monocytes Absolute: 0.5 10*3/uL (ref 0.1–1.0)
Monocytes Relative: 5 % (ref 3–12)
RDW: 14 % (ref 11.5–15.5)

## 2012-04-30 LAB — BASIC METABOLIC PANEL
CO2: 27 mEq/L (ref 19–32)
Glucose, Bld: 120 mg/dL — ABNORMAL HIGH (ref 70–99)
Potassium: 4.3 mEq/L (ref 3.5–5.3)
Sodium: 137 mEq/L (ref 135–145)

## 2012-04-30 LAB — HEPATIC FUNCTION PANEL
ALT: 38 U/L (ref 0–53)
AST: 28 U/L (ref 0–37)
Indirect Bilirubin: 1 mg/dL — ABNORMAL HIGH (ref 0.0–0.9)
Total Protein: 7.4 g/dL (ref 6.0–8.3)

## 2012-04-30 MED ORDER — OMEPRAZOLE 40 MG PO CPDR: 40.0000 mg | DELAYED_RELEASE_CAPSULE | Freq: Every day | ORAL | Status: DC

## 2012-04-30 NOTE — Patient Instructions (Addendum)
Follow up with Dr. Amador Cunas within 1 week Please call our office if your symptoms do not improve or gets worse.

## 2012-04-30 NOTE — Assessment & Plan Note (Addendum)
46 year old [place] American male with acute febrile illness. I suspect viral etiology. However patient has epigastric discomfort of unclear etiology. Obtain stat CBCD, BMET, LFTs and lipase. Patient urged to push fluids and use Tylenol 650 mg every 8 hours as needed for fever. Patient chronically takes diclofenac. His abdominal pain may be related to NSAID gastritis. Patient given prescription for omeprazole 40 mg once daily. Patient understands to contact our office if the symptoms worsen. Follow up with his PCP within one week.  Rapid influenza test was negative  Addendum 05/01/12 - CBCD shows elevated neutrophils.  Electrolytes and kidney function normal.  Lipase normal. Patient still filling poorly.  I suggest empiric treatment with azithromycin for 7 days.  Follow up with PCP within 1 week.

## 2012-04-30 NOTE — Progress Notes (Signed)
Subjective:    Patient ID: Walter Jordan., male    DOB: 11-Oct-1965, 46 y.o.   MRN: [id number]  HPI  46 year old African American male complains of fever, diffuse achiness and stomach pain that started this morning. He reports traveling to [place] last week. He mentions that he has obstructive sleep apnea and he left some water in his CPAP machine. His machine was not cleaned out before reusing when he got home.  Patient reports waking up this morning and he felt weak and experienced epigastric pain with nausea. Nausea seemed to improve but he has persistent epigastric discomfort. He tried to go to work but came home early and took nap when he got home. Wife urged patient to seek medical attention.   Review of Systems Negative for vomiting.  Negative for shortness of breath.  Minimal cough  Past Medical History  Diagnosis Date  . ED (erectile dysfunction)   . Allergic rhinitis   . GERD (gastroesophageal reflux disease)   . Hypothyroidism   . Low back pain   . OSA (obstructive sleep apnea)     History   Social History  . Marital Status: Married    Spouse Name: N/A    Number of Children: N/A  . Years of Education: N/A   Occupational History  . Not on file.   Social History Main Topics  . Smoking status: Never Smoker   . Smokeless tobacco: Never Used  . Alcohol Use: No  . Drug Use: No  . Sexually Active: Not on file   Other Topics Concern  . Not on file   Social History Narrative  . No narrative on file    Past Surgical History  Procedure Date  . Hernia repair     x2 1968, abd 1973    Family History  Problem Relation Age of Onset  . Cancer Father     stomach   . Diabetes Father     s/p renal transplant  . Diabetes Brother   . Diabetes Maternal Grandmother   . Diabetes Paternal Grandmother   . Cancer Paternal Grandmother     colon  . Depression Other   . Diabetes Other   . Hypertension Other   . Kidney disease Other   . Arthritis Mother     No  Known Allergies  Current Outpatient Prescriptions on File Prior to Visit  Medication Sig Dispense Refill  . diclofenac (VOLTAREN) 75 MG EC tablet Take 1 tablet (75 mg total) by mouth 2 (two) times daily as needed.  60 tablet  2  . fexofenadine (ALLEGRA) 180 MG tablet Take 1 tablet (180 mg total) by mouth daily.  60 tablet  3  . fluticasone (FLONASE) 50 MCG/ACT nasal spray Place 2 sprays into the nose daily.  16 g  5  . HYDROcodone-acetaminophen (VICODIN) 5-500 MG per tablet Take 1-2 tablets by mouth every 6 (six) hours as needed.  50 tablet  0  . Multiple Vitamins-Minerals (MEGA MULTIVITAMIN FOR MEN PO) Take by mouth daily.        Marland Kitchen omeprazole (PRILOSEC) 40 MG capsule Take 1 capsule (40 mg total) by mouth daily before breakfast.  30 capsule  3    BP 134/92  Temp 102.1 F (38.9 C) (Oral)  Wt 236 lb (107.049 kg)  SpO2 96%       Objective:   Physical Exam  Constitutional: He is oriented to person, place, and time. He appears well-developed and well-nourished.  HENT:  Head: Normocephalic and atraumatic.  Right and left tympanic membranes are retracted and slightly erythematous Crowded oropharynx, unable to visualize back of throat.  Eyes: EOM are normal. Pupils are equal, round, and reactive to light.  Neck: Neck supple.       No neck tenderness  Cardiovascular: Normal rate.        tachycardic  Pulmonary/Chest: Effort normal and breath sounds normal. He has no wheezes.  Abdominal: Soft. He exhibits no mass. There is no rebound and no guarding.       Epigastric tenderness  Musculoskeletal: He exhibits no edema.  Lymphadenopathy:    He has no cervical adenopathy.  Neurological: He is alert and oriented to person, place, and time. No cranial nerve deficit.  Skin: Skin is warm. No rash noted.  Psychiatric: He has a normal mood and affect. His behavior is normal.          Assessment & Plan:

## 2012-05-01 MED ORDER — AZITHROMYCIN 250 MG PO TABS: ORAL_TABLET | ORAL | Status: DC

## 2012-05-01 NOTE — Addendum Note (Signed)
Addended by: Meda Coffee on: 05/01/2012 12:50 PM   Modules accepted: Orders

## 2013-03-19 ENCOUNTER — Other Ambulatory Visit (INDEPENDENT_AMBULATORY_CARE_PROVIDER_SITE_OTHER): Payer: 59

## 2013-03-19 DIAGNOSIS — Z0000 Encounter for general adult medical examination without abnormal findings: Secondary | ICD-10-CM

## 2013-03-19 LAB — LIPID PANEL
Cholesterol: 191 mg/dL (ref 0–200)
Total CHOL/HDL Ratio: 4
Triglycerides: 68 mg/dL (ref 0.0–149.0)

## 2013-03-19 LAB — CBC WITH DIFFERENTIAL/PLATELET
Basophils Absolute: 0 10*3/uL (ref 0.0–0.1)
HCT: 45.7 % (ref 39.0–52.0)
Lymphs Abs: 2 10*3/uL (ref 0.7–4.0)
MCV: 91.7 fl (ref 78.0–100.0)
Monocytes Absolute: 0.7 10*3/uL (ref 0.1–1.0)
Platelets: 245 10*3/uL (ref 150.0–400.0)
RDW: 13.8 % (ref 11.5–14.6)

## 2013-03-19 LAB — POCT URINALYSIS DIPSTICK
Glucose, UA: NEGATIVE
Nitrite, UA: NEGATIVE
Urobilinogen, UA: 0.2

## 2013-03-19 LAB — PSA: PSA: 0.53 ng/mL (ref 0.10–4.00)

## 2013-03-19 LAB — BASIC METABOLIC PANEL
BUN: 13 mg/dL (ref 6–23)
Chloride: 101 mEq/L (ref 96–112)
Creatinine, Ser: 1.1 mg/dL (ref 0.4–1.5)
GFR: 88.3 mL/min (ref >60.00)
Glucose, Bld: 101 mg/dL — ABNORMAL HIGH (ref 70–99)
Potassium: 4.4 mEq/L (ref 3.5–5.1)

## 2013-03-19 LAB — HEPATIC FUNCTION PANEL: Total Bilirubin: 1.5 mg/dL — ABNORMAL HIGH (ref 0.3–1.2)

## 2013-03-19 LAB — TSH: TSH: 5.22 u[IU]/mL (ref 0.35–5.50)

## 2013-03-26 ENCOUNTER — Ambulatory Visit (INDEPENDENT_AMBULATORY_CARE_PROVIDER_SITE_OTHER): Payer: 59 | Admitting: Internal Medicine

## 2013-03-26 ENCOUNTER — Encounter: Payer: Self-pay | Admitting: Internal Medicine

## 2013-03-26 VITALS — BP 110/70 | HR 69 | Temp 98.4°F | Resp 20 | Ht 70.5 in | Wt 239.0 lb

## 2013-03-26 DIAGNOSIS — E039 Hypothyroidism, unspecified: Secondary | ICD-10-CM

## 2013-03-26 DIAGNOSIS — G4733 Obstructive sleep apnea (adult) (pediatric): Secondary | ICD-10-CM

## 2013-03-26 NOTE — Progress Notes (Signed)
Subjective:    Patient ID: Walter Pretty., male    DOB: 09-25-1965, 47 y.o.   MRN: [id number]  HPI Pre-visit discussion using our clinic review tool. No additional management support is needed unless otherwise documented below in the visit note.  47 year old patient who is seen today for a preventive health exam  Allergies:  1) ! Pcn   Past History:  Past Medical History:   Kidney Stones  GERD  Hypertension  Allergic rhinitis  Family history of father age 44 history of diabetes hypertension and apparently bilateral renal cancer. He is status post renal transplantation Mother history of hypertension status post total hip replacement surgery and history of generalized osteoarthritis One brother with diabetes and hypertension one sister with exogenous obesity  Social history married very active at his local gym nonsmoker  Past Medical History  Diagnosis Date  . ED (erectile dysfunction)   . Allergic rhinitis   . GERD (gastroesophageal reflux disease)   . Hypothyroidism   . Low back pain   . OSA (obstructive sleep apnea)     History   Social History  . Marital Status: Married    Spouse Name: N/A    Number of Children: N/A  . Years of Education: N/A   Occupational History  . Not on file.   Social History Main Topics  . Smoking status: Never Smoker   . Smokeless tobacco: Never Used  . Alcohol Use: No  . Drug Use: No  . Sexual Activity: Not on file   Other Topics Concern  . Not on file   Social History Narrative  . No narrative on file    Past Surgical History  Procedure Laterality Date  . Hernia repair      x2 1968, abd 1973    Family History  Problem Relation Age of Onset  . Cancer Father     stomach   . Diabetes Father     s/p renal transplant  . Diabetes Brother   . Diabetes Maternal Grandmother   . Diabetes Paternal Grandmother   . Cancer Paternal Grandmother     colon  . Depression Other   . Diabetes Other   . Hypertension Other   .  Kidney disease Other   . Arthritis Mother     No Known Allergies  Current Outpatient Prescriptions on File Prior to Visit  Medication Sig Dispense Refill  . Multiple Vitamins-Minerals (MEGA MULTIVITAMIN FOR MEN PO) Take by mouth daily.         No current facility-administered medications on file prior to visit.    BP 110/70  Pulse 69  Temp(Src) 98.4 F (36.9 C) (Oral)  Resp 20  Ht 5' 10.5" (1.791 m)  Wt 239 lb (108.41 kg)  BMI 33.80 kg/m2  SpO2 98%     Review of Systems  Constitutional: Negative for fever, chills, activity change, appetite change and fatigue.  HENT: Negative for congestion, dental problem, ear pain, hearing loss, mouth sores, rhinorrhea, sinus pressure, sneezing, tinnitus, trouble swallowing and voice change.   Eyes: Negative for photophobia, pain, redness and visual disturbance.  Respiratory: Negative for apnea, cough, choking, chest tightness, shortness of breath and wheezing.   Cardiovascular: Negative for chest pain, palpitations and leg swelling.  Gastrointestinal: Negative for nausea, vomiting, abdominal pain, diarrhea, constipation, blood in stool, abdominal distention, anal bleeding and rectal pain.  Genitourinary: Negative for dysuria, urgency, frequency, hematuria, flank pain, decreased urine volume, discharge, penile swelling, scrotal swelling, difficulty urinating, genital sores and testicular pain.  Musculoskeletal: Negative for arthralgias, back pain, gait problem, joint swelling, myalgias, neck pain and neck stiffness.  Skin: Negative for color change, rash and wound.  Neurological: Negative for dizziness, tremors, seizures, syncope, facial asymmetry, speech difficulty, weakness, light-headedness, numbness and headaches.  Hematological: Negative for adenopathy. Does not bruise/bleed easily.  Psychiatric/Behavioral: Negative for suicidal ideas, hallucinations, behavioral problems, confusion, sleep disturbance, self-injury, dysphoric mood, decreased  concentration and agitation. The patient is not nervous/anxious.        Objective:   Physical Exam  Constitutional: He appears well-developed and well-nourished.  HENT:  Head: Normocephalic and atraumatic.  Right Ear: External ear normal.  Left Ear: External ear normal.  Nose: Nose normal.  Mouth/Throat: Oropharynx is clear and moist.  Eyes: Conjunctivae and EOM are normal. Pupils are equal, round, and reactive to light. No scleral icterus.  Neck: Normal range of motion. Neck supple. No JVD present. No thyromegaly present.  Cardiovascular: Regular rhythm, normal heart sounds and intact distal pulses.  Exam reveals no gallop and no friction rub.   No murmur heard. Pulmonary/Chest: Effort normal and breath sounds normal. He exhibits no tenderness.  Abdominal: Soft. Bowel sounds are normal. He exhibits no distension and no mass. There is no tenderness.  Genitourinary: Prostate normal and penis normal. Guaiac negative stool.  Musculoskeletal: Normal range of motion. He exhibits no edema and no tenderness.  Lymphadenopathy:    He has no cervical adenopathy.  Neurological: He is alert. He has normal reflexes. No cranial nerve deficit. Coordination normal.  Skin: Skin is warm and dry. No rash noted.  Psychiatric: He has a normal mood and affect. His behavior is normal.          Assessment & Plan:  Preventive health exam PERRLA continue active lifestyle with regular exercise regimen. Weight loss encouraged.

## 2013-03-26 NOTE — Patient Instructions (Signed)
Limit your sodium (Salt) intake    It is important that you exercise regularly, at least 20 minutes 3 to 4 times per week.  If you develop chest pain or shortness of breath seek  medical attention.  Return in one year for follow-up   

## 2013-09-29 ENCOUNTER — Encounter: Payer: Self-pay | Admitting: Internal Medicine

## 2013-09-29 ENCOUNTER — Ambulatory Visit (INDEPENDENT_AMBULATORY_CARE_PROVIDER_SITE_OTHER): Payer: 59 | Admitting: Internal Medicine

## 2013-09-29 VITALS — BP 120/90 | HR 83 | Temp 100.3°F | Resp 20 | Ht 70.5 in | Wt 237.0 lb

## 2013-09-29 DIAGNOSIS — R509 Fever, unspecified: Secondary | ICD-10-CM

## 2013-09-29 MED ORDER — DICLOFENAC SODIUM 75 MG PO TBEC: 75.0000 mg | DELAYED_RELEASE_TABLET | Freq: Two times a day (BID) | ORAL | Status: DC

## 2013-09-29 MED ORDER — HYDROCODONE-ACETAMINOPHEN 10-325 MG PO TABS: 1.0000 | ORAL_TABLET | Freq: Three times a day (TID) | ORAL | Status: DC | PRN

## 2013-09-29 NOTE — Progress Notes (Signed)
Pre-visit discussion using our clinic review tool. No additional management support is needed unless otherwise documented below in the visit note.  

## 2013-09-29 NOTE — Patient Instructions (Signed)
Acute bronchitis symptoms for less than 10 days are generally not helped by antibiotics.  Take over-the-counter expectorants and cough medications such as  Mucinex DM.  Call if there is no improvement in 5 to 7 days or if  you develop worsening cough, fever, or new symptoms, such as shortness of breath or chest pain.   

## 2013-09-29 NOTE — Progress Notes (Signed)
Subjective:    Patient ID: Walter Jordan., male    DOB: Jan 04, 1966, 48 y.o.   MRN: [id number]  HPI  48 year old patient, who presents with a three-day history of fever, cough, headache, chest congestion, mild sore throat, and a general sense of a wellness.  Cough is alert, and nonproductive. Nonsmoker.  Denies any chills  Past Medical History  Diagnosis Date  . ED (erectile dysfunction)   . Allergic rhinitis   . GERD (gastroesophageal reflux disease)   . Hypothyroidism   . Low back pain   . OSA (obstructive sleep apnea)     History   Social History  . Marital Status: Married    Spouse Name: N/A    Number of Children: N/A  . Years of Education: N/A   Occupational History  . Not on file.   Social History Main Topics  . Smoking status: Never Smoker   . Smokeless tobacco: Never Used  . Alcohol Use: No  . Drug Use: No  . Sexual Activity: Not on file   Other Topics Concern  . Not on file   Social History Narrative  . No narrative on file    Past Surgical History  Procedure Laterality Date  . Hernia repair      x2 1968, abd 1973    Family History  Problem Relation Age of Onset  . Cancer Father     stomach   . Diabetes Father     s/p renal transplant  . Diabetes Brother   . Diabetes Maternal Grandmother   . Diabetes Paternal Grandmother   . Cancer Paternal Grandmother     colon  . Depression Other   . Diabetes Other   . Hypertension Other   . Kidney disease Other   . Arthritis Mother     No Known Allergies  Current Outpatient Prescriptions on File Prior to Visit  Medication Sig Dispense Refill  . Multiple Vitamins-Minerals (MEGA MULTIVITAMIN FOR MEN PO) Take by mouth daily.        . cyclobenzaprine (FLEXERIL) 5 MG tablet Take 5 mg by mouth as needed for muscle spasms.      . diclofenac (VOLTAREN) 75 MG EC tablet Take 75 mg by mouth 2 (two) times daily as needed.       No current facility-administered medications on file prior to visit.    BP  120/90  Pulse 83  Temp(Src) 100.3 F (37.9 C) (Oral)  Resp 20  Ht 5' 10.5" (1.791 m)  Wt 237 lb (107.502 kg)  BMI 33.51 kg/m2  SpO2 98%       Review of Systems  Constitutional: Positive for fever, activity change and appetite change. Negative for chills and fatigue.  HENT: Positive for congestion and sore throat. Negative for dental problem, ear pain, hearing loss, tinnitus, trouble swallowing and voice change.   Eyes: Negative for pain, discharge and visual disturbance.  Respiratory: Positive for cough. Negative for chest tightness, wheezing and stridor.   Cardiovascular: Negative for chest pain, palpitations and leg swelling.  Gastrointestinal: Negative for nausea, vomiting, abdominal pain, diarrhea, constipation, blood in stool and abdominal distention.  Genitourinary: Negative for urgency, hematuria, flank pain, discharge, difficulty urinating and genital sores.  Musculoskeletal: Negative for arthralgias, back pain, gait problem, joint swelling, myalgias and neck stiffness.  Skin: Negative for rash.  Neurological: Positive for weakness. Negative for dizziness, syncope, speech difficulty, numbness and headaches.  Hematological: Negative for adenopathy. Does not bruise/bleed easily.  Psychiatric/Behavioral: Negative for behavioral problems and  dysphoric mood. The patient is not nervous/anxious.        Objective:   Physical Exam  Constitutional: He is oriented to person, place, and time. He appears well-developed and well-nourished.  Appears unwell, but in no acute distress Temperature 100.3  HENT:  Head: Normocephalic.  Right Ear: External ear normal.  Left Ear: External ear normal.  Eyes: Conjunctivae and EOM are normal.  Neck: Normal range of motion.  Cardiovascular: Normal rate, regular rhythm and normal heart sounds.   Pulmonary/Chest: Effort normal and breath sounds normal. No respiratory distress. He has no wheezes. He has no rales. He exhibits no tenderness.    Abdominal: Bowel sounds are normal. He exhibits no distension. There is no tenderness. There is no rebound.  Musculoskeletal: Normal range of motion. He exhibits no edema and no tenderness.  Neurological: He is alert and oriented to person, place, and time.  Psychiatric: He has a normal mood and affect. His behavior is normal.          Assessment & Plan:   Viral URI with cough.  Will treat symptomatically Schedule CPX 6 months

## 2014-03-05 ENCOUNTER — Telehealth: Payer: Self-pay | Admitting: Internal Medicine

## 2014-03-05 NOTE — Telephone Encounter (Signed)
Pt needs cpx before end of yr. Pt last cpx 03-2013. Can I create 30 min slot?

## 2014-03-05 NOTE — Telephone Encounter (Signed)
lmom pt to cb

## 2014-03-05 NOTE — Telephone Encounter (Signed)
Yes

## 2014-03-10 NOTE — Telephone Encounter (Signed)
Pt has been sch

## 2014-03-10 NOTE — Telephone Encounter (Signed)
lmom pt to cb

## 2014-04-07 ENCOUNTER — Other Ambulatory Visit (INDEPENDENT_AMBULATORY_CARE_PROVIDER_SITE_OTHER): Payer: 59

## 2014-04-07 DIAGNOSIS — Z0000 Encounter for general adult medical examination without abnormal findings: Secondary | ICD-10-CM

## 2014-04-07 LAB — BASIC METABOLIC PANEL
BUN: 13 mg/dL (ref 6–23)
CO2: 24 mEq/L (ref 19–32)
Calcium: 9.5 mg/dL (ref 8.4–10.5)
Chloride: 106 mEq/L (ref 96–112)
Creatinine, Ser: 1.3 mg/dL (ref 0.4–1.5)
GFR: 77.61 mL/min (ref >60.00)
Glucose, Bld: 104 mg/dL — ABNORMAL HIGH (ref 70–99)
POTASSIUM: 4.8 meq/L (ref 3.5–5.1)
Sodium: 141 mEq/L (ref 135–145)

## 2014-04-07 LAB — TSH: TSH: 4.56 u[IU]/mL — ABNORMAL HIGH (ref 0.35–4.50)

## 2014-04-07 LAB — CBC WITH DIFFERENTIAL/PLATELET
Basophils Absolute: 0 10*3/uL (ref 0.0–0.1)
Basophils Relative: 0.7 % (ref 0.0–3.0)
EOS PCT: 3.4 % (ref 0.0–5.0)
Eosinophils Absolute: 0.1 10*3/uL (ref 0.0–0.7)
HEMATOCRIT: 46.9 % (ref 39.0–52.0)
Hemoglobin: 15.8 g/dL (ref 13.0–17.0)
Lymphocytes Relative: 47.9 % — ABNORMAL HIGH (ref 12.0–46.0)
Lymphs Abs: 1.9 10*3/uL (ref 0.7–4.0)
MCHC: 33.6 g/dL (ref 30.0–36.0)
MCV: 92.3 fl (ref 78.0–100.0)
MONOS PCT: 12.7 % — AB (ref 3.0–12.0)
Monocytes Absolute: 0.5 10*3/uL (ref 0.1–1.0)
NEUTROS PCT: 35.3 % — AB (ref 43.0–77.0)
Neutro Abs: 1.4 10*3/uL (ref 1.4–7.7)
Platelets: 241 10*3/uL (ref 150.0–400.0)
RBC: 5.08 Mil/uL (ref 4.22–5.81)
RDW: 13.7 % (ref 11.5–15.5)
WBC: 4 10*3/uL (ref 4.0–10.5)

## 2014-04-07 LAB — LIPID PANEL
CHOL/HDL RATIO: 4
Cholesterol: 180 mg/dL (ref 0–200)
HDL: 50.6 mg/dL (ref >39.00)
LDL CALC: 114 mg/dL — AB (ref 0–99)
NonHDL: 129.4
TRIGLYCERIDES: 77 mg/dL (ref 0.0–149.0)
VLDL: 15.4 mg/dL (ref 0.0–40.0)

## 2014-04-07 LAB — PSA: PSA: 0.88 ng/mL (ref 0.10–4.00)

## 2014-04-07 LAB — HEPATIC FUNCTION PANEL
ALT: 31 U/L (ref 0–53)
AST: 29 U/L (ref 0–37)
Albumin: 4.6 g/dL (ref 3.5–5.2)
Alkaline Phosphatase: 61 U/L (ref 39–117)
BILIRUBIN DIRECT: 0.2 mg/dL (ref 0.0–0.3)
BILIRUBIN TOTAL: 0.9 mg/dL (ref 0.2–1.2)
Total Protein: 7.5 g/dL (ref 6.0–8.3)

## 2014-04-07 LAB — POCT URINALYSIS DIPSTICK
Bilirubin, UA: NEGATIVE
Blood, UA: NEGATIVE
GLUCOSE UA: NEGATIVE
KETONES UA: NEGATIVE
Leukocytes, UA: NEGATIVE
Nitrite, UA: NEGATIVE
Protein, UA: NEGATIVE
SPEC GRAV UA: 1.02
Urobilinogen, UA: 0.2
pH, UA: 7

## 2014-04-14 ENCOUNTER — Ambulatory Visit (INDEPENDENT_AMBULATORY_CARE_PROVIDER_SITE_OTHER): Payer: 59 | Admitting: Internal Medicine

## 2014-04-14 ENCOUNTER — Encounter: Payer: Self-pay | Admitting: Internal Medicine

## 2014-04-14 VITALS — BP 100/72 | Temp 98.6°F | Ht 71.0 in | Wt 236.0 lb

## 2014-04-14 DIAGNOSIS — G4733 Obstructive sleep apnea (adult) (pediatric): Secondary | ICD-10-CM

## 2014-04-14 DIAGNOSIS — J3089 Other allergic rhinitis: Secondary | ICD-10-CM

## 2014-04-14 DIAGNOSIS — M545 Low back pain: Secondary | ICD-10-CM

## 2014-04-14 DIAGNOSIS — E039 Hypothyroidism, unspecified: Secondary | ICD-10-CM

## 2014-04-14 DIAGNOSIS — Z0000 Encounter for general adult medical examination without abnormal findings: Secondary | ICD-10-CM

## 2014-04-14 DIAGNOSIS — R7302 Impaired glucose tolerance (oral): Secondary | ICD-10-CM

## 2014-04-14 NOTE — Patient Instructions (Addendum)
It is important that you exercise regularly, at least 20 minutes 3 to 4 times per week.  If you develop chest pain or shortness of breath seek  medical attention.  You need to lose weight.  Consider a lower calorie diet and regular exercise.  Return in one year for follow-up Health Maintenance A healthy lifestyle and preventative care can promote health and wellness.  Maintain regular health, dental, and eye exams.  Eat a healthy diet. Foods like vegetables, fruits, whole grains, low-fat dairy products, and lean protein foods contain the nutrients you need and are low in calories. Decrease your intake of foods high in solid fats, added sugars, and salt. Get information about a proper diet from your health care provider, if necessary.  Regular physical exercise is one of the most important things you can do for your health. Most adults should get at least 150 minutes of moderate-intensity exercise (any activity that increases your heart rate and causes you to sweat) each week. In addition, most adults need muscle-strengthening exercises on 2 or more days a week.   Maintain a healthy weight. The body mass index (BMI) is a screening tool to identify possible weight problems. It provides an estimate of body fat based on height and weight. Your health care provider can find your BMI and can help you achieve or maintain a healthy weight. For males 20 years and older:  A BMI below 18.5 is considered underweight.  A BMI of 18.5 to 24.9 is normal.  A BMI of 25 to 29.9 is considered overweight.  A BMI of 30 and above is considered obese.  Maintain normal blood lipids and cholesterol by exercising and minimizing your intake of saturated fat. Eat a balanced diet with plenty of fruits and vegetables. Blood tests for lipids and cholesterol should begin at age 20 and be repeated every 5 years. If your lipid or cholesterol levels are high, you are over age 50, or you are at high risk for heart disease,  you may need your cholesterol levels checked more frequently.Ongoing high lipid and cholesterol levels should be treated with medicines if diet and exercise are not working.  If you smoke, find out from your health care provider how to quit. If you do not use tobacco, do not start.  Lung cancer screening is recommended for adults aged 55-80 years who are at high risk for developing lung cancer because of a history of smoking. A yearly low-dose CT scan of the lungs is recommended for people who have at least a 30-pack-year history of smoking and are current smokers or have quit within the past 15 years. A pack year of smoking is smoking an average of 1 pack of cigarettes a day for 1 year (for example, a 30-pack-year history of smoking could mean smoking 1 pack a day for 30 years or 2 packs a day for 15 years). Yearly screening should continue until the smoker has stopped smoking for at least 15 years. Yearly screening should be stopped for people who develop a health problem that would prevent them from having lung cancer treatment.  If you choose to drink alcohol, do not have more than 2 drinks per day. One drink is considered to be 12 oz (360 mL) of beer, 5 oz (150 mL) of wine, or 1.5 oz (45 mL) of liquor.  Avoid the use of street drugs. Do not share needles with anyone. Ask for help if you need support or instructions about stopping the use of drugs.    High blood pressure causes heart disease and increases the risk of stroke. Blood pressure should be checked at least every 1-2 years. Ongoing high blood pressure should be treated with medicines if weight loss and exercise are not effective.  If you are 33-2 years old, ask your health care provider if you should take aspirin to prevent heart disease.  Diabetes screening involves taking a blood sample to check your fasting blood sugar level. This should be done once every 3 years after age 43 if you are at a normal weight and without risk factors for  diabetes. Testing should be considered at a younger age or be carried out more frequently if you are overweight and have at least 1 risk factor for diabetes.  Colorectal cancer can be detected and often prevented. Most routine colorectal cancer screening begins at the age of 79 and continues through age 29. However, your health care provider may recommend screening at an earlier age if you have risk factors for colon cancer. On a yearly basis, your health care provider may provide home test kits to check for hidden blood in the stool. A small camera at the end of a tube may be used to directly examine the colon (sigmoidoscopy or colonoscopy) to detect the earliest forms of colorectal cancer. Talk to your health care provider about this at age 58 when routine screening begins. A direct exam of the colon should be repeated every 5-10 years through age 50, unless early forms of precancerous polyps or small growths are found.  People who are at an increased risk for hepatitis B should be screened for this virus. You are considered at high risk for hepatitis B if:  You were born in a country where hepatitis B occurs often. Talk with your health care provider about which countries are considered high risk.  Your parents were born in a high-risk country and you have not received a shot to protect against hepatitis B (hepatitis B vaccine).  You have HIV or AIDS.  You use needles to inject street drugs.  You live with, or have sex with, someone who has hepatitis B.  You are a man who has sex with other men (MSM).  You get hemodialysis treatment.  You take certain medicines for conditions like cancer, organ transplantation, and autoimmune conditions.  Hepatitis C blood testing is recommended for all people born from [birth year] through 1965 and any individual with known risk factors for hepatitis C.  Healthy men should no longer receive prostate-specific antigen (PSA) blood tests as part of routine cancer  screening. Talk to your health care provider about prostate cancer screening.  Testicular cancer screening is not recommended for adolescents or adult males who have no symptoms. Screening includes self-exam, a health care provider exam, and other screening tests. Consult with your health care provider about any symptoms you have or any concerns you have about testicular cancer.  Practice safe sex. Use condoms and avoid high-risk sexual practices to reduce the spread of sexually transmitted infections (STIs).  You should be screened for STIs, including gonorrhea and chlamydia if:  You are sexually active and are younger than 24 years.  You are older than 24 years, and your health care provider tells you that you are at risk for this type of infection.  Your sexual activity has changed since you were last screened, and you are at an increased risk for chlamydia or gonorrhea. Ask your health care provider if you are at risk.  If you are  If you are at risk of being infected with HIV, it is recommended that you take a prescription medicine daily to prevent HIV infection. This is called pre-exposure prophylaxis (PrEP). You are considered at risk if:  You are a man who has sex with other men (MSM).  You are a heterosexual man who is sexually active with multiple partners.  You take drugs by injection.  You are sexually active with a partner who has HIV.  Talk with your health care provider about whether you are at high risk of being infected with HIV. If you choose to begin PrEP, you should first be tested for HIV. You should then be tested every 3 months for as long as you are taking PrEP.  Use sunscreen. Apply sunscreen liberally and repeatedly throughout the day. You should seek shade when your shadow is shorter than you. Protect yourself by wearing long sleeves, pants, a wide-brimmed hat, and sunglasses year round whenever you are outdoors.  Tell your health care provider of new moles or changes in  moles, especially if there is a change in shape or color. Also, tell your health care provider if a mole is larger than the size of a pencil eraser.  A one-time screening for abdominal aortic aneurysm (AAA) and surgical repair of large AAAs by ultrasound is recommended for men aged 65-75 years who are current or former smokers.  Stay current with your vaccines (immunizations). Document Released: 11/04/2007 Document Revised: 05/13/2013 Document Reviewed: 10/03/2010 [employer] Patient Information 2015 [employer]. This information is not intended to replace advice given to you by your health care provider. Make sure you discuss any questions you have with your health care provider.  

## 2014-04-14 NOTE — Progress Notes (Signed)
Subjective:    Patient ID: Walter Jordan., male    DOB: [birth year], 48 y.o.   MRN: [id number]  HPI  Pre-visit discussion using our clinic review tool. No additional management support is needed unless otherwise documented below in the visit note.  41 -year-old patient who is seen today for a preventive health exam  Allergies:  1) ! Pcn   Past History:  Past Medical History:   Kidney Stones  GERD  Hypertension  Allergic rhinitis  Family history of father age 73 history of diabetes hypertension and apparently bilateral renal cancer. He is status post renal transplantation Mother history of hypertension status post total hip replacement surgery and history of generalized osteoarthritis One brother with diabetes and hypertension one sister with exogenous obesity and hypertension  Social history married very active at his local gym nonsmoker  Colonoscopy 2009 secondary to rectal bleeding  Past Medical History  Diagnosis Date  . ED (erectile dysfunction)   . Allergic rhinitis   . GERD (gastroesophageal reflux disease)   . Hypothyroidism   . Low back pain   . OSA (obstructive sleep apnea)     History   Social History  . Marital Status: Married    Spouse Name: N/A    Number of Children: N/A  . Years of Education: N/A   Occupational History  . Not on file.   Social History Main Topics  . Smoking status: Never Smoker   . Smokeless tobacco: Never Used  . Alcohol Use: No  . Drug Use: No  . Sexual Activity: Not on file   Other Topics Concern  . Not on file   Social History Narrative    Past Surgical History  Procedure Laterality Date  . Hernia repair      x2 1968, abd 1973    Family History  Problem Relation Age of Onset  . Cancer Father     stomach   . Diabetes Father     s/p renal transplant  . Diabetes Brother   . Diabetes Maternal Grandmother   . Diabetes Paternal Grandmother   . Cancer Paternal Grandmother     colon  . Depression Other   .  Diabetes Other   . Hypertension Other   . Kidney disease Other   . Arthritis Mother     No Known Allergies  Current Outpatient Prescriptions on File Prior to Visit  Medication Sig Dispense Refill  . Multiple Vitamins-Minerals (MEGA MULTIVITAMIN FOR MEN PO) Take by mouth daily.      . cyclobenzaprine (FLEXERIL) 5 MG tablet Take 5 mg by mouth as needed for muscle spasms.    Marland Kitchen HYDROcodone-acetaminophen (NORCO) 10-325 MG per tablet Take 1 tablet by mouth every 8 (eight) hours as needed. (Patient not taking: Reported on 04/14/2014) 30 tablet 0   No current facility-administered medications on file prior to visit.    BP 100/72 mmHg  Temp(Src) 98.6 F (37 C) (Oral)  Ht 5\' 11"  (1.803 m)  Wt 236 lb (107.049 kg)  BMI 32.93 kg/m2     Review of Systems  Constitutional: Negative for fever, chills, activity change, appetite change and fatigue.  HENT: Negative for congestion, dental problem, ear pain, hearing loss, mouth sores, rhinorrhea, sinus pressure, sneezing, tinnitus, trouble swallowing and voice change.   Eyes: Negative for photophobia, pain, redness and visual disturbance.  Respiratory: Negative for apnea, cough, choking, chest tightness, shortness of breath and wheezing.   Cardiovascular: Negative for chest pain, palpitations and leg swelling.  Gastrointestinal: Negative  for nausea, vomiting, abdominal pain, diarrhea, constipation, blood in stool, abdominal distention, anal bleeding and rectal pain.  Genitourinary: Negative for dysuria, urgency, frequency, hematuria, flank pain, decreased urine volume, discharge, penile swelling, scrotal swelling, difficulty urinating, genital sores and testicular pain.  Musculoskeletal: Positive for back pain. Negative for myalgias, joint swelling, arthralgias, gait problem, neck pain and neck stiffness.       Occasional foot pain  Skin: Negative for color change, rash and wound.  Neurological: Negative for dizziness, tremors, seizures, syncope,  facial asymmetry, speech difficulty, weakness, light-headedness, numbness and headaches.  Hematological: Negative for adenopathy. Does not bruise/bleed easily.  Psychiatric/Behavioral: Negative for suicidal ideas, hallucinations, behavioral problems, confusion, sleep disturbance, self-injury, dysphoric mood, decreased concentration and agitation. The patient is not nervous/anxious.        Objective:   Physical Exam  Constitutional: He appears well-developed and well-nourished.  HENT:  Head: Normocephalic and atraumatic.  Right Ear: External ear normal.  Left Ear: External ear normal.  Nose: Nose normal.  Mouth/Throat: Oropharynx is clear and moist.  Pharyngeal crowding  Eyes: Conjunctivae and EOM are normal. Pupils are equal, round, and reactive to light. No scleral icterus.  Neck: Normal range of motion. Neck supple. No JVD present. No thyromegaly present.  Cardiovascular: Regular rhythm, normal heart sounds and intact distal pulses.  Exam reveals no gallop and no friction rub.   No murmur heard. Pulmonary/Chest: Effort normal and breath sounds normal. He exhibits no tenderness.  Abdominal: Soft. Bowel sounds are normal. He exhibits no distension and no mass. There is no tenderness.  Genitourinary: Prostate normal and penis normal.  Musculoskeletal: Normal range of motion. He exhibits no edema or tenderness.  Lymphadenopathy:    He has no cervical adenopathy.  Neurological: He is alert. He has normal reflexes. No cranial nerve deficit. Coordination normal.  Skin: Skin is warm and dry. No rash noted.  Psychiatric: He has a normal mood and affect. His behavior is normal.           Assessment & Plan:  Preventive health exam. Continue active lifestyle with regular exercise regimen. Weight loss encouraged. Intermittent low back pain Exogenous obesity.  Recheck one year

## 2014-05-20 ENCOUNTER — Telehealth: Payer: Self-pay | Admitting: Internal Medicine

## 2014-05-20 NOTE — Telephone Encounter (Signed)
Pt states he was working on his car and hurt his back.  Pt would like refills of  HYDROcodone-acetaminophen  cyclobenzaprine (FLEXERIL) 5 MG tablet [facility]/ [address]

## 2014-05-20 NOTE — Telephone Encounter (Signed)
pls advise

## 2014-05-21 MED ORDER — HYDROCODONE-ACETAMINOPHEN 10-325 MG PO TABS: 1.0000 | ORAL_TABLET | Freq: Three times a day (TID) | ORAL | Status: DC | PRN

## 2014-05-21 MED ORDER — CYCLOBENZAPRINE HCL 5 MG PO TABS: 5.0000 mg | ORAL_TABLET | ORAL | Status: DC | PRN

## 2014-05-21 NOTE — Telephone Encounter (Signed)
Patient is aware Rx is ready for pick-up.

## 2014-05-21 NOTE — Telephone Encounter (Signed)
#  30 each

## 2014-05-21 NOTE — Telephone Encounter (Signed)
Both rx refilled. Attempted to leave a message but vm is full.

## 2014-06-15 ENCOUNTER — Other Ambulatory Visit: Payer: Self-pay | Admitting: Orthopaedic Surgery

## 2014-06-15 DIAGNOSIS — M545 Low back pain: Secondary | ICD-10-CM

## 2014-06-18 ENCOUNTER — Other Ambulatory Visit: Payer: Self-pay | Admitting: Internal Medicine

## 2014-06-23 ENCOUNTER — Other Ambulatory Visit: Payer: Self-pay

## 2014-07-28 ENCOUNTER — Ambulatory Visit
Admission: RE | Admit: 2014-07-28 | Discharge: 2014-07-28 | Disposition: A | Discharge status: Home / Self Care | Payer: 59 | Source: Ambulatory Visit | Attending: Orthopaedic Surgery | Admitting: Orthopaedic Surgery

## 2014-07-28 DIAGNOSIS — M545 Low back pain: Secondary | ICD-10-CM

## 2014-10-20 ENCOUNTER — Telehealth: Payer: Self-pay | Admitting: Internal Medicine

## 2014-10-20 NOTE — Telephone Encounter (Signed)
Okay to refill Hydrocodone.  

## 2014-10-20 NOTE — Telephone Encounter (Signed)
Pt needs new rx hydrocodone °

## 2014-10-20 NOTE — Telephone Encounter (Signed)
30

## 2014-10-21 MED ORDER — HYDROCODONE-ACETAMINOPHEN 10-325 MG PO TABS: 1.0000 | ORAL_TABLET | Freq: Three times a day (TID) | ORAL | Status: DC | PRN

## 2014-10-21 NOTE — Telephone Encounter (Signed)
Left detailed message Rx ready for pickup, will be at the front desk. Rx printed and signed. 

## 2014-11-10 ENCOUNTER — Telehealth: Payer: Self-pay | Admitting: Internal Medicine

## 2014-11-10 NOTE — Telephone Encounter (Signed)
Pt request refill of the following: HYDROcodone-acetaminophen (NORCO) 10-325 MG per tablet ° ° °Phamacy: ° °

## 2014-11-11 MED ORDER — HYDROCODONE-ACETAMINOPHEN 10-325 MG PO TABS: 1.0000 | ORAL_TABLET | Freq: Three times a day (TID) | ORAL | Status: DC | PRN

## 2014-11-11 NOTE — Telephone Encounter (Signed)
Pt requesting refill on Hydrocodone was last filled 6/1 # 30 tablets. Please advise if okay to refill?

## 2014-11-11 NOTE — Telephone Encounter (Signed)
Left detailed message Rx ready for pickup. Rx printed and signed. 

## 2014-11-11 NOTE — Telephone Encounter (Signed)
OK #30 

## 2014-12-07 ENCOUNTER — Other Ambulatory Visit: Payer: Self-pay | Admitting: Orthopaedic Surgery

## 2014-12-07 DIAGNOSIS — M25511 Pain in right shoulder: Secondary | ICD-10-CM

## 2015-01-04 ENCOUNTER — Telehealth: Payer: Self-pay | Admitting: Internal Medicine

## 2015-01-04 MED ORDER — HYDROCODONE-ACETAMINOPHEN 10-325 MG PO TABS: 1.0000 | ORAL_TABLET | Freq: Three times a day (TID) | ORAL | Status: DC | PRN

## 2015-01-04 NOTE — Telephone Encounter (Signed)
Pt notified Rx ready for pickup. Rx printed and signed.  

## 2015-01-04 NOTE — Telephone Encounter (Signed)
Pt is completely out and back is hurting

## 2015-01-04 NOTE — Telephone Encounter (Signed)
Pt request refill of the following: HYDROcodone-acetaminophen (NORCO) 10-325 MG per tablet ° ° °Phamacy: ° °

## 2015-01-11 ENCOUNTER — Ambulatory Visit
Admission: RE | Admit: 2015-01-11 | Discharge: 2015-01-11 | Disposition: A | Discharge status: Home / Self Care | Payer: 59 | Source: Ambulatory Visit | Attending: Orthopaedic Surgery | Admitting: Orthopaedic Surgery

## 2015-01-11 DIAGNOSIS — M25511 Pain in right shoulder: Secondary | ICD-10-CM

## 2015-02-16 ENCOUNTER — Telehealth: Payer: Self-pay | Admitting: Internal Medicine

## 2015-02-16 NOTE — Telephone Encounter (Signed)
Please see message and advise, pt last seen 03/2014 and last Rx was 8/15 # 30.

## 2015-02-16 NOTE — Telephone Encounter (Signed)
Pt would like new rx hydrocodone with out  acetaminophen

## 2015-02-16 NOTE — Telephone Encounter (Signed)
Generic Vicodin 5/3 2 5  mg  #40 one every 6 hours as needed CPX 2 months

## 2015-02-17 MED ORDER — HYDROCODONE-ACETAMINOPHEN 5-325 MG PO TABS: 1.0000 | ORAL_TABLET | Freq: Four times a day (QID) | ORAL | Status: DC | PRN

## 2015-02-17 NOTE — Telephone Encounter (Signed)
Left message on voicemail to call office.  

## 2015-02-17 NOTE — Telephone Encounter (Signed)
Left detailed message on personal voicemail Rx ready for pickup will be at the front desk. Also the Rx is for Hydrocodone with Acetaminophen they do not make it without and Dr.K said you need to schedule physical in 2 months. Please call office at [phone] to schedule.

## 2015-04-06 ENCOUNTER — Telehealth: Payer: Self-pay | Admitting: Internal Medicine

## 2015-04-06 NOTE — Telephone Encounter (Signed)
Pt is aware md out of office and donna will be back tomorrow. Pt is having back pain

## 2015-04-06 NOTE — Telephone Encounter (Signed)
Pt request refill HYDROcodone-acetaminophen (NORCO) 10-325 MG per tablet °

## 2015-04-07 MED ORDER — HYDROCODONE-ACETAMINOPHEN 5-325 MG PO TABS: 1.0000 | ORAL_TABLET | Freq: Four times a day (QID) | ORAL | Status: DC | PRN

## 2015-04-07 NOTE — Telephone Encounter (Signed)
Pt notified Rx ready for pickup. Rx printed and signed by Dr. Todd.  

## 2015-05-07 ENCOUNTER — Telehealth: Payer: Self-pay | Admitting: Internal Medicine

## 2015-05-07 NOTE — Telephone Encounter (Signed)
Pt has been sch for 05-10-15

## 2015-05-07 NOTE — Telephone Encounter (Signed)
Please advise 

## 2015-05-07 NOTE — Telephone Encounter (Signed)
Walter Jordan can you please make an appt for this pt for next week thanks

## 2015-05-07 NOTE — Telephone Encounter (Signed)
Pt called saying his Hydrocodone isn't working as well as it used to. He thinks he needs a stronger dosage due to being in constant back pain. I told him since he's not been here since 2015 he more than likely needs to come in. He'd like a phone call regarding this.  Pt's ph# [phone] Thank you.

## 2015-05-07 NOTE — Telephone Encounter (Signed)
Agree .  Please schedule return office visit for next week;

## 2015-05-10 ENCOUNTER — Encounter: Payer: Self-pay | Admitting: Internal Medicine

## 2015-05-10 ENCOUNTER — Ambulatory Visit (INDEPENDENT_AMBULATORY_CARE_PROVIDER_SITE_OTHER): Payer: 59 | Admitting: Internal Medicine

## 2015-05-10 VITALS — BP 118/80 | HR 80 | Temp 98.5°F | Resp 20 | Ht 71.0 in | Wt 219.0 lb

## 2015-05-10 DIAGNOSIS — M5440 Lumbago with sciatica, unspecified side: Secondary | ICD-10-CM

## 2015-05-10 DIAGNOSIS — G8929 Other chronic pain: Secondary | ICD-10-CM | POA: Diagnosis not present

## 2015-05-10 MED ORDER — HYDROCODONE-ACETAMINOPHEN 10-325 MG PO TABS: 1.0000 | ORAL_TABLET | Freq: Four times a day (QID) | ORAL | Status: DC | PRN

## 2015-05-10 NOTE — Progress Notes (Signed)
Pre visit review using our clinic review tool, if applicable. No additional management support is needed unless otherwise documented below in the visit note. 

## 2015-05-10 NOTE — Progress Notes (Signed)
Subjective:    Patient ID: Walter Jordan., male    DOB: 07/26/1965, 49 y.o.   MRN: [id number]  HPI  Wt Readings from Last 3 Encounters:  05/10/15 219 lb (99.338 kg)  04/14/14 236 lb (107.049 kg)  09/29/13 237 lb (107.76 kg)    49 year old patient who is seen today in follow-up.  He has a history of chronic low back pain.  He has been evaluated by [facility] and did have a lumbar MRI earlier in the year.  He also had epidurals which were not very helpful.  Since his last visit here, he has been successful with some modest weight loss.  No new concerns or complaints  Past Medical History  Diagnosis Date  . ED (erectile dysfunction)   . Allergic rhinitis   . GERD (gastroesophageal reflux disease)   . Hypothyroidism   . Low back pain   . OSA (obstructive sleep apnea)     Social History   Social History  . Marital Status: Married    Spouse Name: N/A  . Number of Children: N/A  . Years of Education: N/A   Occupational History  . Not on file.   Social History Main Topics  . Smoking status: Never Smoker   . Smokeless tobacco: Never Used  . Alcohol Use: No  . Drug Use: No  . Sexual Activity: Not on file   Other Topics Concern  . Not on file   Social History Narrative    Past Surgical History  Procedure Laterality Date  . Hernia repair      x2 1968, abd 1973    Family History  Problem Relation Age of Onset  . Cancer Father     stomach   . Diabetes Father     s/p renal transplant  . Diabetes Brother   . Diabetes Maternal Grandmother   . Diabetes Paternal Grandmother   . Cancer Paternal Grandmother     colon  . Depression Other   . Diabetes Other   . Hypertension Other   . Kidney disease Other   . Arthritis Mother     No Known Allergies  Current Outpatient Prescriptions on File Prior to Visit  Medication Sig Dispense Refill  . HYDROcodone-acetaminophen (NORCO/VICODIN) 5-325 MG tablet Take 1 tablet by mouth every 6 (six) hours as needed for  moderate pain. 40 tablet 0  . meloxicam (MOBIC) 15 MG tablet   1  . Multiple Vitamins-Minerals (MEGA MULTIVITAMIN FOR MEN PO) Take by mouth daily.       No current facility-administered medications on file prior to visit.    BP 118/80 mmHg  Pulse 80  Temp(Src) 98.5 F (36.9 C) (Oral)  Resp 20  Ht 5\' 11"  (1.803 m)  Wt 219 lb (99.338 kg)  BMI 30.56 kg/m2  SpO2 98%     Review of Systems  Constitutional: Negative for fever, chills, appetite change and fatigue.  HENT: Negative for congestion, dental problem, ear pain, hearing loss, sore throat, tinnitus, trouble swallowing and voice change.   Eyes: Negative for pain, discharge and visual disturbance.  Respiratory: Negative for cough, chest tightness, wheezing and stridor.   Cardiovascular: Negative for chest pain, palpitations and leg swelling.  Gastrointestinal: Negative for nausea, vomiting, abdominal pain, diarrhea, constipation, blood in stool and abdominal distention.  Genitourinary: Negative for urgency, hematuria, flank pain, discharge, difficulty urinating and genital sores.  Musculoskeletal: Positive for back pain. Negative for myalgias, joint swelling, arthralgias, gait problem and neck stiffness.  Skin: Negative  for rash.  Neurological: Negative for dizziness, syncope, speech difficulty, weakness, numbness and headaches.  Hematological: Negative for adenopathy. Does not bruise/bleed easily.  Psychiatric/Behavioral: Negative for behavioral problems and dysphoric mood. The patient is not nervous/anxious.        Objective:   Physical Exam  Constitutional: He is oriented to person, place, and time. He appears well-developed.  HENT:  Head: Normocephalic.  Right Ear: External ear normal.  Left Ear: External ear normal.  Eyes: Conjunctivae and EOM are normal.  Neck: Normal range of motion.  Cardiovascular: Normal rate and normal heart sounds.   Pulmonary/Chest: Breath sounds normal.  Abdominal: Bowel sounds are normal.    Musculoskeletal: Normal range of motion. He exhibits no edema or tenderness.  Neurological: He is alert and oriented to person, place, and time.  Psychiatric: He has a normal mood and affect. His behavior is normal.          Assessment & Plan:   Chronic low back pain.  Will continue hydrocodone which he takes intermittently.  He has not responded well to tramadol.  He does take anti-inflammatory medications also as needed.  Schedule annual CPX

## 2015-05-25 ENCOUNTER — Telehealth: Payer: Self-pay | Admitting: Internal Medicine

## 2015-05-25 MED ORDER — HYDROCODONE-ACETAMINOPHEN 10-325 MG PO TABS: 1.0000 | ORAL_TABLET | Freq: Four times a day (QID) | ORAL | Status: DC | PRN

## 2015-05-25 NOTE — Telephone Encounter (Signed)
Spoke to pt, told him Rx was just filled on 12/19 for 90 tablets, are you out of medication? Pt said no but I only have 10 tablets left. Told pt that the Rx is suppose to last you for the month. Pt said he has to take more due to back pain and that is what he told Dr. Raliegh Ip. Asked pt how often are you taking it? Pt said every 6 hours and sometimes I take 2. Told pt should not be taking 2 at a time. I will send message to Dr. Raliegh Ip and see what he says. Pt verbalized understanding.

## 2015-05-25 NOTE — Telephone Encounter (Signed)
Pt called and asked to get a refill on his Hydrocodone/Acetaminophen. Please advise.

## 2015-05-25 NOTE — Telephone Encounter (Signed)
Okay to refill early, but made patient aware that 90 pills must last 1 full month.  In the future.  If this is not adequate we'll need to refer to pain management.  Suggest patient may want to follow up with his orthopedic physician

## 2015-05-25 NOTE — Telephone Encounter (Signed)
Spoke to pt, told him per Dr. Raliegh Ip: Faythe Ghee to refill early, but make you aware that 90 pills must last 1 full month. In the future. If this is not adequate we'll need to refer to pain management. Also told pt Dr. Raliegh Ip suggest you may want to follow up with your orthopedic physician. Pt verbalized understanding. Told pt Rx ready for pickup, will be at the front desk. Rx printed and signed.

## 2015-05-25 NOTE — Telephone Encounter (Signed)
Dr. Raliegh Ip, pt requesting refill. Please see message and advise.

## 2015-06-25 ENCOUNTER — Telehealth: Payer: Self-pay | Admitting: Internal Medicine

## 2015-06-25 MED ORDER — HYDROCODONE-ACETAMINOPHEN 10-325 MG PO TABS: 1.0000 | ORAL_TABLET | Freq: Four times a day (QID) | ORAL | Status: DC | PRN

## 2015-06-25 NOTE — Telephone Encounter (Signed)
Pt request refill  °HYDROcodone-acetaminophen (NORCO) 10-325 MG tablet °

## 2015-06-25 NOTE — Telephone Encounter (Signed)
Left message on voicemail Rx ready for pickup will be at the front desk. Rx printed and signed.  

## 2015-08-02 ENCOUNTER — Telehealth: Payer: Self-pay | Admitting: Internal Medicine

## 2015-08-02 MED ORDER — HYDROCODONE-ACETAMINOPHEN 10-325 MG PO TABS: 1.0000 | ORAL_TABLET | Freq: Four times a day (QID) | ORAL | Status: DC | PRN

## 2015-08-02 NOTE — Telephone Encounter (Signed)
Pt needs new rx hydrocodone °

## 2015-08-02 NOTE — Telephone Encounter (Signed)
Left detailed message Rx ready for pickup, will be at the front desk. Rx printed and signed by Dr. Sherren Mocha.

## 2015-09-15 ENCOUNTER — Telehealth: Payer: Self-pay | Admitting: Internal Medicine

## 2015-09-15 NOTE — Telephone Encounter (Signed)
Pt request refill  °HYDROcodone-acetaminophen (NORCO) 10-325 MG tablet °

## 2015-09-16 MED ORDER — HYDROCODONE-ACETAMINOPHEN 10-325 MG PO TABS: 1.0000 | ORAL_TABLET | Freq: Four times a day (QID) | ORAL | Status: DC | PRN

## 2015-09-16 NOTE — Telephone Encounter (Signed)
Left message on voicemail Rx ready for pickup, will be at the front desk. Rx printed and signed. 

## 2015-11-19 ENCOUNTER — Ambulatory Visit (INDEPENDENT_AMBULATORY_CARE_PROVIDER_SITE_OTHER): Payer: 59 | Admitting: Internal Medicine

## 2015-11-19 ENCOUNTER — Encounter: Payer: Self-pay | Admitting: Internal Medicine

## 2015-11-19 VITALS — BP 100/78 | HR 88 | Temp 99.7°F | Ht 71.0 in | Wt 217.0 lb

## 2015-11-19 DIAGNOSIS — J3089 Other allergic rhinitis: Secondary | ICD-10-CM

## 2015-11-19 DIAGNOSIS — R42 Dizziness and giddiness: Secondary | ICD-10-CM

## 2015-11-19 MED ORDER — PREDNISONE 20 MG PO TABS: 20.0000 mg | ORAL_TABLET | Freq: Two times a day (BID) | ORAL | Status: DC

## 2015-11-19 NOTE — Patient Instructions (Signed)
Acute sinusitis symptoms for less than 10 days are generally not helped by antibiotic therapy.  Use saline irrigation, warm  moist compresses and over-the-counter decongestants only as directed.  Call if there is no improvement in 5 to 7 days, or sooner if you develop increasing pain, fever, or any new symptoms.  HOME CARE INSTRUCTIONS  Drink plenty of water. Water helps thin the mucus so your sinuses can drain more easily.  Use a humidifier.  Inhale steam 3-4 times a day (for example, sit in the bathroom with the shower running).  Apply a warm, moist washcloth to your face 3-4 times a day, or as directed by your health care provider.  Use saline nasal sprays to help moisten and clean your sinuses.  Take medicines only as directed by your health care provider.  

## 2015-11-19 NOTE — Progress Notes (Signed)
Pre visit review using our clinic review tool, if applicable. No additional management support is needed unless otherwise documented below in the visit note. 

## 2015-11-19 NOTE — Progress Notes (Signed)
Subjective:    Patient ID: Walter Jordan., male    DOB: August 06, 1965, 50 y.o.   MRN: [id number]  HPI  50 year old patient who has a history of allergic rhinitis.  He presents with a three-day history of sore throat and a chief complaint of facial pressure, congestion and dizziness.  Denies any fever or chills, although office, temperature 99.7.  He has been using Mucinex  Past Medical History  Diagnosis Date  . ED (erectile dysfunction)   . Allergic rhinitis   . GERD (gastroesophageal reflux disease)   . Hypothyroidism   . Low back pain   . OSA (obstructive sleep apnea)      Social History   Social History  . Marital Status: Married    Spouse Name: N/A  . Number of Children: N/A  . Years of Education: N/A   Occupational History  . Not on file.   Social History Main Topics  . Smoking status: Never Smoker   . Smokeless tobacco: Never Used  . Alcohol Use: No  . Drug Use: No  . Sexual Activity: Not on file   Other Topics Concern  . Not on file   Social History Narrative    Past Surgical History  Procedure Laterality Date  . Hernia repair      x2 1968, abd 1973    Family History  Problem Relation Age of Onset  . Cancer Father     stomach   . Diabetes Father     s/p renal transplant  . Diabetes Brother   . Diabetes Maternal Grandmother   . Diabetes Paternal Grandmother   . Cancer Paternal Grandmother     colon  . Depression Other   . Diabetes Other   . Hypertension Other   . Kidney disease Other   . Arthritis Mother     No Known Allergies  Current Outpatient Prescriptions on File Prior to Visit  Medication Sig Dispense Refill  . HYDROcodone-acetaminophen (NORCO) 10-325 MG tablet Take 1 tablet by mouth every 6 (six) hours as needed. 90 tablet 0  . meloxicam (MOBIC) 15 MG tablet   1  . methocarbamol (ROBAXIN) 500 MG tablet 1 TABLET EVERY 6-8HRS AS NEEDED FOR SPASMS/PAIN  1  . Multiple Vitamins-Minerals (MEGA MULTIVITAMIN FOR MEN PO) Take by mouth  daily.       No current facility-administered medications on file prior to visit.    BP 100/78 mmHg  Pulse 88  Temp(Src) 99.7 F (37.6 C) (Oral)  Ht 5\' 11"  (1.803 m)  Wt 217 lb (98.431 kg)  BMI 30.28 kg/m2  SpO2 98%     Review of Systems  Constitutional: Positive for activity change, appetite change and fatigue. Negative for fever and chills.  HENT: Positive for congestion and sinus pressure. Negative for dental problem, ear pain, hearing loss, sore throat, tinnitus, trouble swallowing and voice change.   Eyes: Negative for pain, discharge and visual disturbance.  Respiratory: Negative for cough, chest tightness, wheezing and stridor.   Cardiovascular: Negative for chest pain, palpitations and leg swelling.  Gastrointestinal: Negative for nausea, vomiting, abdominal pain, diarrhea, constipation, blood in stool and abdominal distention.  Genitourinary: Negative for urgency, hematuria, flank pain, discharge, difficulty urinating and genital sores.  Musculoskeletal: Negative for myalgias, back pain, joint swelling, arthralgias, gait problem and neck stiffness.  Skin: Negative for rash.  Neurological: Positive for dizziness, weakness, light-headedness and headaches. Negative for syncope, speech difficulty and numbness.  Hematological: Negative for adenopathy. Does not bruise/bleed easily.  Psychiatric/Behavioral:  Negative for behavioral problems and dysphoric mood. The patient is not nervous/anxious.        Objective:   Physical Exam  Constitutional: He is oriented to person, place, and time. He appears well-developed and well-nourished. No distress.   Appears unwell, but in no acute distress.  Blood pressure low normal Temperature 99.7 Pulse 80  HENT:  Head: Normocephalic.  Right Ear: External ear normal.  Left Ear: External ear normal.  Mouth/Throat: Oropharynx is clear and moist.  No focal sinus tenderness  Eyes: Conjunctivae and EOM are normal.  Neck: Normal range of  motion.  Cardiovascular: Normal rate, regular rhythm and normal heart sounds.   Pulmonary/Chest: Breath sounds normal. No respiratory distress. He has no wheezes. He has no rales.  Abdominal: Bowel sounds are normal.  Musculoskeletal: Normal range of motion. He exhibits no edema or tenderness.  Neurological: He is alert and oriented to person, place, and time.  Psychiatric: He has a normal mood and affect. His behavior is normal.          Assessment & Plan:   URI/allergic rhinitis.  Will treat with expectorants.  Hydration, decongestants, and a short burst of prednisone  Nyoka Cowden, MD

## 2015-12-08 ENCOUNTER — Telehealth: Payer: Self-pay | Admitting: Internal Medicine

## 2015-12-08 NOTE — Telephone Encounter (Signed)
Pt need new Rx Hydrocodone. °

## 2015-12-13 ENCOUNTER — Ambulatory Visit (INDEPENDENT_AMBULATORY_CARE_PROVIDER_SITE_OTHER): Payer: 59 | Admitting: Internal Medicine

## 2015-12-13 ENCOUNTER — Encounter: Payer: Self-pay | Admitting: Internal Medicine

## 2015-12-13 VITALS — BP 110/76 | HR 85 | Temp 98.5°F | Ht 71.0 in | Wt 218.0 lb

## 2015-12-13 DIAGNOSIS — L723 Sebaceous cyst: Secondary | ICD-10-CM

## 2015-12-13 DIAGNOSIS — L089 Local infection of the skin and subcutaneous tissue, unspecified: Secondary | ICD-10-CM

## 2015-12-13 MED ORDER — AMOXICILLIN-POT CLAVULANATE 875-125 MG PO TABS: 1.0000 | ORAL_TABLET | Freq: Two times a day (BID) | ORAL | 0 refills | Status: DC

## 2015-12-13 MED ORDER — HYDROCODONE-ACETAMINOPHEN 10-325 MG PO TABS: 1.0000 | ORAL_TABLET | Freq: Four times a day (QID) | ORAL | 0 refills | Status: DC | PRN

## 2015-12-13 NOTE — Patient Instructions (Addendum)
Excision of Skin Lesions, Care After °Refer to this sheet in the next few weeks. These instructions provide you with information about caring for yourself after your procedure. Your health care provider may also give you more specific instructions. Your treatment has been planned according to current medical practices, but problems sometimes occur. Call your health care provider if you have any problems or questions after your procedure. °WHAT TO EXPECT AFTER THE PROCEDURE °After your procedure, it is common to have pain or discomfort at the excision site. °HOME CARE INSTRUCTIONS °· Take over-the-counter and prescription medicines only as told by your health care provider. °· Follow instructions from your health care provider about: °¨ How to take care of your excision site. You should keep the site clean, dry, and protected for at least 48 hours. °¨ When and how you should change your bandage (dressing). °¨ When you should remove your dressing. °¨ Removing whatever was used to close your excision site. °· Check the excision area every day for signs of infection. Watch for: °¨ Redness, swelling, or pain. °¨ Fluid, blood, or pus. °· For bleeding, apply gentle but firm pressure to the area using a folded towel for 20 minutes. °· Avoid high-impact exercise and activities until the stitches (sutures) are removed or the area heals. °· Follow instructions from your health care provider about how to minimize scarring. Avoid sun exposure until the area has healed. Scarring should lessen over time. °· Keep all follow-up visits as told by your health care provider. This is important. °SEEK MEDICAL CARE IF: °· You have a fever. °· You have redness, swelling, or pain at the excision site. °· You have fluid, blood, or pus coming from the excision site. °· You have ongoing bleeding at the excision site. °· You have pain that does not improve in 2-3 days after your procedure. °· You notice skin irregularities or changes in  sensation. °  °This information is not intended to replace advice given to you by your health care provider. Make sure you discuss any questions you have with your health care provider. °  °Document Released: 09/22/2014 Document Reviewed: 09/22/2014 °Elsevier Interactive Patient Education ©2016 [employer] ° °

## 2015-12-13 NOTE — Telephone Encounter (Signed)
Okay to refill? 

## 2015-12-13 NOTE — Progress Notes (Signed)
Pre visit review using our clinic review tool, if applicable. No additional management support is needed unless otherwise documented below in the visit note. 

## 2015-12-13 NOTE — Progress Notes (Signed)
Subjective:    Patient ID: Walter Jordan., male    DOB: 08-Jun-1965, 50 y.o.   MRN: [id number]  HPI  50 year old patient who presents with a enlarging painful soft tissue mass in the right groin area. He had a childhood hernia repair in the area of pain and swelling is just medial to the incision. No history of local trauma No constitutional complaints such as fever No prior history of infected sebaceous cysts  Past Medical History:  Diagnosis Date  . Allergic rhinitis   . ED (erectile dysfunction)   . GERD (gastroesophageal reflux disease)   . Hypothyroidism   . Low back pain   . OSA (obstructive sleep apnea)      Social History   Social History  . Marital status: Married    Spouse name: N/A  . Number of children: N/A  . Years of education: N/A   Occupational History  . Not on file.   Social History Main Topics  . Smoking status: Never Smoker  . Smokeless tobacco: Never Used  . Alcohol use No  . Drug use: No  . Sexual activity: Not on file   Other Topics Concern  . Not on file   Social History Narrative  . No narrative on file    Past Surgical History:  Procedure Laterality Date  . HERNIA REPAIR     x2 1968, abd 1973    Family History  Problem Relation Age of Onset  . Cancer Father     stomach   . Diabetes Father     s/p renal transplant  . Diabetes Brother   . Diabetes Maternal Grandmother   . Diabetes Paternal Grandmother   . Cancer Paternal Grandmother     colon  . Depression Other   . Diabetes Other   . Hypertension Other   . Kidney disease Other   . Arthritis Mother     No Known Allergies  Current Outpatient Prescriptions on File Prior to Visit  Medication Sig Dispense Refill  . meloxicam (MOBIC) 15 MG tablet   1  . methocarbamol (ROBAXIN) 500 MG tablet 1 TABLET EVERY 6-8HRS AS NEEDED FOR SPASMS/PAIN  1  . Multiple Vitamins-Minerals (MEGA MULTIVITAMIN FOR MEN PO) Take by mouth daily.       No current facility-administered  medications on file prior to visit.     BP 110/76   Pulse 85   Temp 98.5 F (36.9 C) (Oral)   Ht 5\' 11"  (1.803 m)   Wt 218 lb (98.9 kg)   SpO2 98%   BMI 30.40 kg/m     Review of Systems  Skin: Positive for wound.       Objective:   Physical Exam  Constitutional: He appears well-developed and well-nourished. No distress.  Afebrile  Skin:  Approximate 3 x 4 cm, tender, warm cystic mass noted just medial to an incision in the right groin area, very little fluctuance.          Assessment & Plan:   Infected sebaceous cyst, right groin.  Options discussed.  It was elected to attempt I&D.  After informed consent, the area cleansed with Betadine and alcohol and anesthetized with topical anesthetic as well as 2% lidocaine.  A small incision approximately 1.5 centimeters made in the midline.  Very small amount of pus expressed.  Blunt dissection yielded no additional purulent drainage.  Antibiotic ointment applied and the wound dressed.  Patient was given self-care instructions for wound management.  He was placed  on antibiotic therapy.  He is aware of the possible need of I&D in the future  Nyoka Cowden, MD

## 2016-03-29 ENCOUNTER — Telehealth: Payer: Self-pay | Admitting: Internal Medicine

## 2016-03-29 MED ORDER — METHOCARBAMOL 500 MG PO TABS: ORAL_TABLET | ORAL | 1 refills | Status: DC

## 2016-03-29 MED ORDER — HYDROCODONE-ACETAMINOPHEN 10-325 MG PO TABS: 1.0000 | ORAL_TABLET | Freq: Four times a day (QID) | ORAL | 0 refills | Status: DC | PRN

## 2016-03-29 NOTE — Telephone Encounter (Signed)
Pt request refill  HYDROcodone-acetaminophen (NORCO) 10-325 MG tablet 90     ALSO  Request refill  methocarbamol (ROBAXIN) 500 MG tablet  [facility] [address]

## 2016-03-29 NOTE — Telephone Encounter (Signed)
Left detailed message on personal voicemail Rx ready for pickup, will be at the front desk. Other Rx was sent to pharmacy. Also overdue for physical please call office and schedule for Jan or Feb. Rx printed and signed.

## 2016-04-12 ENCOUNTER — Other Ambulatory Visit (INDEPENDENT_AMBULATORY_CARE_PROVIDER_SITE_OTHER): Payer: 59

## 2016-04-12 DIAGNOSIS — Z0000 Encounter for general adult medical examination without abnormal findings: Secondary | ICD-10-CM | POA: Diagnosis not present

## 2016-04-12 LAB — POC URINALSYSI DIPSTICK (AUTOMATED)
BILIRUBIN UA: NEGATIVE
Glucose, UA: NEGATIVE
Ketones, UA: NEGATIVE
Leukocytes, UA: NEGATIVE
NITRITE UA: NEGATIVE
PH UA: 6.5
SPEC GRAV UA: 1.02
UROBILINOGEN UA: 0.2

## 2016-04-12 LAB — HEPATIC FUNCTION PANEL
ALBUMIN: 4.4 g/dL (ref 3.5–5.2)
ALT: 28 U/L (ref 0–53)
AST: 22 U/L (ref 0–37)
Alkaline Phosphatase: 58 U/L (ref 39–117)
Bilirubin, Direct: 0.2 mg/dL (ref 0.0–0.3)
TOTAL PROTEIN: 6.9 g/dL (ref 6.0–8.3)
Total Bilirubin: 1.1 mg/dL (ref 0.2–1.2)

## 2016-04-12 LAB — CBC WITH DIFFERENTIAL/PLATELET
Basophils Absolute: 0 10*3/uL (ref 0.0–0.1)
Basophils Relative: 1.1 % (ref 0.0–3.0)
EOS PCT: 3.3 % (ref 0.0–5.0)
Eosinophils Absolute: 0.1 10*3/uL (ref 0.0–0.7)
HEMATOCRIT: 44.9 % (ref 39.0–52.0)
HEMOGLOBIN: 15.2 g/dL (ref 13.0–17.0)
LYMPHS ABS: 1.5 10*3/uL (ref 0.7–4.0)
LYMPHS PCT: 37.1 % (ref 12.0–46.0)
MCHC: 33.9 g/dL (ref 30.0–36.0)
MCV: 93.4 fl (ref 78.0–100.0)
MONOS PCT: 11.9 % (ref 3.0–12.0)
Monocytes Absolute: 0.5 10*3/uL (ref 0.1–1.0)
Neutro Abs: 1.9 10*3/uL (ref 1.4–7.7)
Neutrophils Relative %: 46.6 % (ref 43.0–77.0)
Platelets: 234 10*3/uL (ref 150.0–400.0)
RBC: 4.8 Mil/uL (ref 4.22–5.81)
RDW: 13.3 % (ref 11.5–15.5)
WBC: 4.1 10*3/uL (ref 4.0–10.5)

## 2016-04-12 LAB — LIPID PANEL
CHOLESTEROL: 153 mg/dL (ref 0–200)
HDL: 46 mg/dL (ref >39.00)
LDL Cholesterol: 96 mg/dL (ref 0–99)
NonHDL: 107.45
Total CHOL/HDL Ratio: 3
Triglycerides: 59 mg/dL (ref 0.0–149.0)
VLDL: 11.8 mg/dL (ref 0.0–40.0)

## 2016-04-12 LAB — BASIC METABOLIC PANEL
BUN: 15 mg/dL (ref 6–23)
CALCIUM: 9.3 mg/dL (ref 8.4–10.5)
CHLORIDE: 104 meq/L (ref 96–112)
CO2: 30 mEq/L (ref 19–32)
CREATININE: 1.31 mg/dL (ref 0.40–1.50)
GFR: 74.27 mL/min (ref >60.00)
Glucose, Bld: 93 mg/dL (ref 70–99)
Potassium: 4.5 mEq/L (ref 3.5–5.1)
Sodium: 141 mEq/L (ref 135–145)

## 2016-04-12 LAB — TSH: TSH: 3.35 u[IU]/mL (ref 0.35–4.50)

## 2016-04-12 LAB — PSA: PSA: 1.09 ng/mL (ref 0.10–4.00)

## 2016-04-19 ENCOUNTER — Ambulatory Visit (INDEPENDENT_AMBULATORY_CARE_PROVIDER_SITE_OTHER): Payer: 59 | Admitting: Internal Medicine

## 2016-04-19 ENCOUNTER — Encounter: Payer: Self-pay | Admitting: Internal Medicine

## 2016-04-19 VITALS — BP 110/78 | HR 78 | Temp 98.0°F | Resp 20 | Ht 70.25 in | Wt 221.2 lb

## 2016-04-19 DIAGNOSIS — Z0000 Encounter for general adult medical examination without abnormal findings: Secondary | ICD-10-CM

## 2016-04-19 DIAGNOSIS — G4733 Obstructive sleep apnea (adult) (pediatric): Secondary | ICD-10-CM

## 2016-04-19 DIAGNOSIS — Z23 Encounter for immunization: Secondary | ICD-10-CM

## 2016-04-19 DIAGNOSIS — Z9989 Dependence on other enabling machines and devices: Secondary | ICD-10-CM

## 2016-04-19 NOTE — Progress Notes (Signed)
Subjective:    Patient ID: Walter Jordan., male    DOB: 1966-05-06, 50 y.o.   MRN: [id number]  HPI  50 year old patient who is seen today for a preventive health examination. He has a history of multilevel degenerative lumbar spine disease.  He continues to have low back pain and does take analgesics and muscle relaxants sparingly.  He has pain down the right leg, which interferes with sleep.  He has a difficult time lying supine and on his right side due to worsening right leg discomfort.  He complains also of frequent urination and also diminishing penile sensation during intercourse.  He is concerned that his back problem is the source of these 2 complaints He has been followed by orthopedics He does have a history of OSA and has seen pulmonary medicine in the past.  After approximately 25 pounds of weight loss.  He self discontinued CPAP.  He states that he has a outdated machine and wishes to resume CPAP.  He is unclear of his prior supplier of equipment  Laboratory studies reviewed  Past Medical History:  Diagnosis Date  . Allergic rhinitis   . ED (erectile dysfunction)   . GERD (gastroesophageal reflux disease)   . Hypothyroidism   . Low back pain   . OSA (obstructive sleep apnea)      Social History   Social History  . Marital status: Married    Spouse name: N/A  . Number of children: N/A  . Years of education: N/A   Occupational History  . Not on file.   Social History Main Topics  . Smoking status: Never Smoker  . Smokeless tobacco: Never Used  . Alcohol use No  . Drug use: No  . Sexual activity: Not on file   Other Topics Concern  . Not on file   Social History Narrative  . No narrative on file    Past Surgical History:  Procedure Laterality Date  . HERNIA REPAIR     x2 1968, abd 1973    Family History  Problem Relation Age of Onset  . Cancer Father     stomach   . Diabetes Father     s/p renal transplant  . Diabetes Brother   . Diabetes  Maternal Grandmother   . Diabetes Paternal Grandmother   . Cancer Paternal Grandmother     colon  . Depression Other   . Diabetes Other   . Hypertension Other   . Kidney disease Other   . Arthritis Mother     No Known Allergies  Current Outpatient Prescriptions on File Prior to Visit  Medication Sig Dispense Refill  . HYDROcodone-acetaminophen (NORCO) 10-325 MG tablet Take 1 tablet by mouth every 6 (six) hours as needed. 90 tablet 0  . meloxicam (MOBIC) 15 MG tablet   1  . methocarbamol (ROBAXIN) 500 MG tablet 1 TABLET EVERY 6-8HRS AS NEEDED FOR SPASMS/PAIN 60 tablet 1  . Multiple Vitamins-Minerals (MEGA MULTIVITAMIN FOR MEN PO) Take by mouth daily.       No current facility-administered medications on file prior to visit.     BP 110/78 (BP Location: Left Arm, Patient Position: Sitting, Cuff Size: Large)   Pulse 78   Temp 98 F (36.7 C) (Oral)   Resp 20   Ht 5' 10.25" (1.784 m)   Wt 221 lb 4 oz (100.4 kg)   SpO2 98%   BMI 31.52 kg/m     Review of Systems  Constitutional: Negative for activity change,  appetite change, chills, fatigue and fever.  HENT: Negative for congestion, dental problem, ear pain, hearing loss, mouth sores, rhinorrhea, sinus pressure, sneezing, tinnitus, trouble swallowing and voice change.   Eyes: Negative for photophobia, pain, redness and visual disturbance.  Respiratory: Negative for apnea, cough, choking, chest tightness, shortness of breath and wheezing.   Cardiovascular: Negative for chest pain, palpitations and leg swelling.  Gastrointestinal: Negative for abdominal distention, abdominal pain, anal bleeding, blood in stool, constipation, diarrhea, nausea, rectal pain and vomiting.  Genitourinary: Negative for decreased urine volume, difficulty urinating, discharge, dysuria, flank pain, frequency, genital sores, hematuria, penile swelling, scrotal swelling, testicular pain and urgency.  Musculoskeletal: Negative for arthralgias, back pain, gait  problem, joint swelling, myalgias, neck pain and neck stiffness.  Skin: Negative for color change, rash and wound.  Neurological: Negative for dizziness, tremors, seizures, syncope, facial asymmetry, speech difficulty, weakness, light-headedness, numbness and headaches.  Hematological: Negative for adenopathy. Does not bruise/bleed easily.  Psychiatric/Behavioral: Negative for agitation, behavioral problems, confusion, decreased concentration, dysphoric mood, hallucinations, self-injury, sleep disturbance and suicidal ideas. The patient is not nervous/anxious.        Objective:   Physical Exam  Constitutional: He appears well-developed and well-nourished.  HENT:  Head: Normocephalic and atraumatic.  Right Ear: External ear normal.  Left Ear: External ear normal.  Nose: Nose normal.  Mouth/Throat: Oropharynx is clear and moist.  Pharyngeal crowding  Eyes: Conjunctivae and EOM are normal. Pupils are equal, round, and reactive to light. No scleral icterus.  Neck: Normal range of motion. Neck supple. No JVD present. No thyromegaly present.  Cardiovascular: Regular rhythm, normal heart sounds and intact distal pulses.  Exam reveals no gallop and no friction rub.   No murmur heard. Pulmonary/Chest: Effort normal and breath sounds normal. He exhibits no tenderness.  Abdominal: Soft. Bowel sounds are normal. He exhibits no distension and no mass. There is no tenderness.  Genitourinary: Prostate normal and penis normal. Rectal exam shows guaiac negative stool.  Musculoskeletal: Normal range of motion. He exhibits no edema or tenderness.  Lymphadenopathy:    He has no cervical adenopathy.  Neurological: He is alert. He has normal reflexes. No cranial nerve deficit. Coordination normal.  No motor weakness Patellar and Achilles reflexes intact Sensory exam normal  Skin: Skin is warm and dry. No rash noted.  Psychiatric: He has a normal mood and affect. His behavior is normal.            Assessment & Plan:   Preventive health examination OSA.  Will set up for home consultation to resume CPAP.  Consider repeat home sleep study to assess ongoing need for CPAP Obesity additional weight loss encouraged Chronic low back pain secondary to multilevel degenerative changes.  Follow-up orthopedics  Screening colonoscopy Laboratory studies reviewed  Nyoka Cowden

## 2016-04-19 NOTE — Patient Instructions (Addendum)
  Orthopedic and pulmonary follow-up  Limit your sodium (Salt) intake    It is important that you exercise regularly, at least 20 minutes 3 to 4 times per week.  If you develop chest pain or shortness of breath seek  medical attention.  You need to lose weight.  Consider a lower calorie diet and regular exercise.  Schedule your colonoscopy to help detect colon cancer.

## 2016-04-19 NOTE — Progress Notes (Signed)
Pre visit review using our clinic review tool, if applicable. No additional management support is needed unless otherwise documented below in the visit note. 

## 2016-05-11 ENCOUNTER — Ambulatory Visit (INDEPENDENT_AMBULATORY_CARE_PROVIDER_SITE_OTHER): Payer: 59 | Admitting: Orthopaedic Surgery

## 2016-05-11 ENCOUNTER — Encounter (INDEPENDENT_AMBULATORY_CARE_PROVIDER_SITE_OTHER): Payer: Self-pay | Admitting: Orthopaedic Surgery

## 2016-05-11 DIAGNOSIS — M5416 Radiculopathy, lumbar region: Secondary | ICD-10-CM

## 2016-05-11 MED ORDER — PREDNISONE 10 MG (21) PO TBPK
ORAL_TABLET | ORAL | 0 refills | Status: DC
Start: 2016-05-11 — End: 2016-06-21

## 2016-05-11 NOTE — Progress Notes (Signed)
Office Visit Note   Patient: Walter Jordan.           Date of Birth: 03-Oct-1965           MRN: [id number] Visit Date: 05/11/2016              Requested by: Marletta Lor, MD [place], [place] [id number] PCP: Nyoka Cowden, MD   Assessment & Plan: Visit Diagnoses:  1. Lumbar radiculopathy     Plan: Patient has re-aggravation of his cervical and lumbar radiculopathy. We'll try oral steroid taper for now if not better he can give [place] a call back to either me or Dr. Ernestina Patches for epidural steroid injection in his neck and back.  Follow-Up Instructions: Return if symptoms worsen or fail to improve.   Orders:  No orders of the defined types were placed in this encounter.  Meds ordered this encounter  Medications  . predniSONE (STERAPRED UNI-PAK 21 TAB) 10 MG (21) TBPK tablet    Sig: Take as directed    Dispense:  21 tablet    Refill:  0      Procedures: No procedures performed   Clinical Data: No additional findings.   Subjective: Chief Complaint  Patient presents with  . Left Shoulder - Pain  . Lower Back - Pain    Patient is a 50 year old who is well-known to me for cervical and lumbar radiculopathy who comes in today with worsening symptoms in his left upper extremity and right leg is reminiscent to before. His left arm feels like there is a weakness sensation in the triceps area and numbing sensation in the thumb and index finger. He denies any focal motor deficits. Denies any shoulder pain. He denies any constitutional symptoms. He does feel a pulling sensation into his right leg that does radiate down into the right foot.    Review of Systems Negative except for history of present illness  Objective: Vital Signs: There were no vitals taken for this visit.  Physical Exam Well-developed well-nourished no acute distress alert and oriented 3 Ortho Exam Exam of the left hand shows negative carpal tunnel compressive signs. He has  subjective paresthesias in the C6 and C7 distribution. His shoulder exam is normal. Exam of the right lower extremity shows negative straight leg raise test. No focal motor sensory deficits. Normal reflexes. Specialty Comments:  No specialty comments available.  Imaging: No results found.   PMFS History: Patient Active Problem List   Diagnosis Date Noted  . Lumbar radiculopathy 05/11/2016  . Febrile illness, acute 04/30/2012  . Impaired glucose tolerance 03/25/2012  . VERTIGO 02/22/2010  . HEADACHE 02/22/2010  . Obstructive sleep apnea 09/27/2009  . ERECTILE DYSFUNCTION, ORGANIC 09/07/2009  . SLEEP APNEA 09/07/2009  . CONSTIPATION 03/13/2008  . RECTAL BLEEDING 03/13/2008  . CHEST PAIN 03/13/2008  . OTHER SYMPTOMS INVOLVING DIGESTIVE SYSTEM OTHER 03/12/2008  . RECTAL BLEEDING, HX OF 03/12/2008  . Hypothyroidism 02/22/2007  . Allergic rhinitis 02/22/2007  . GERD 02/22/2007  . LOW BACK PAIN 02/22/2007   Past Medical History:  Diagnosis Date  . Allergic rhinitis   . ED (erectile dysfunction)   . GERD (gastroesophageal reflux disease)   . Hypothyroidism   . Low back pain   . OSA (obstructive sleep apnea)     Family History  Problem Relation Age of Onset  . Cancer Father     stomach   . Diabetes Father     s/p renal transplant  . Diabetes  Brother   . Diabetes Maternal Grandmother   . Diabetes Paternal Grandmother   . Cancer Paternal Grandmother     colon  . Depression Other   . Diabetes Other   . Hypertension Other   . Kidney disease Other   . Arthritis Mother     Past Surgical History:  Procedure Laterality Date  . HERNIA REPAIR     x2 1968, abd 1973   Social History   Occupational History  . Not on file.   Social History Main Topics  . Smoking status: Never Smoker  . Smokeless tobacco: Never Used  . Alcohol use No  . Drug use: No  . Sexual activity: Not on file

## 2016-06-05 ENCOUNTER — Encounter: Payer: Self-pay | Admitting: Internal Medicine

## 2016-06-21 ENCOUNTER — Ambulatory Visit (INDEPENDENT_AMBULATORY_CARE_PROVIDER_SITE_OTHER): Payer: 59 | Admitting: Pulmonary Disease

## 2016-06-21 ENCOUNTER — Encounter: Payer: Self-pay | Admitting: Pulmonary Disease

## 2016-06-21 VITALS — BP 116/70 | HR 65 | Ht 70.25 in | Wt 223.3 lb

## 2016-06-21 DIAGNOSIS — G4733 Obstructive sleep apnea (adult) (pediatric): Secondary | ICD-10-CM

## 2016-06-21 NOTE — Patient Instructions (Signed)
Home sleep study Based on this, we will try and get you a new CPAP machine

## 2016-06-21 NOTE — Progress Notes (Signed)
Subjective:    Patient ID: Walter Jordan., male    DOB: 12/30/1965, 51 y.o.   MRN: [id number]  HPI  Chief Complaint  Patient presents with  . Sleep Consult    Pt. has sleep apnea. Was tested years ago. Never used a CPAP machine but feels he needs one now. Not resting well at night.    51 year old man, works as Economist, presents for evaluation of obstructive sleep apnea. He underwent sleep study in 2009 which showed very mild OSA with predominant respiratory effort related arousals suggestive of upper airway resistance syndrome, he weighed around 227 pounds back then. He remained symptomatic and was very not very successful with weight loss, hence in 2011 he was started on CPAP with auto settings with full face mask. He had significant improvement in his energy levels and decrease in his fatigue towards evening after starting on the machine. He uses for many years until 2 years ago when he felt that he was getting an infection from water lying around in the humidifier, and he stopped using the machine. He states that the machine is not working now and would like a replacement. Epworth sleepiness score is 8 and a report sleepiness on sitting and reading, watching TV or laying down to rest in the afternoon. He reports repeated nocturnal awakenings and falling asleep when sitting and conversing with his wife in the evenings. Loud snoring has been noted by family members. Bedtime is between 11 PM and midnight, sleep latency is about 30 minutes, he sleeps on his left side with one pillow, reports 3-4 nocturnal awakenings and is out of bed at 5:45 AM on weekdays with feeling tired but denies dryness of mouth or headaches. On weekends he'll laying in bed up to 8:30 AM but still does not feel refreshed.   His weight is mostly unchanged, he may last about 5 pounds to 223 pounds currently. There is no history suggestive of cataplexy, sleep paralysis or parasomnias    PSG 04/2008  >>total sleep time 297 minutes, AHI 4/hour , 35 RERAs with RDI 11/hour     Past Medical History:  Diagnosis Date  . Allergic rhinitis   . ED (erectile dysfunction)   . GERD (gastroesophageal reflux disease)   . Hypothyroidism   . Low back pain   . OSA (obstructive sleep apnea)     Past Surgical History:  Procedure Laterality Date  . HERNIA REPAIR     x2 1968, abd 1973   No Known Allergies  Social History   Social History  . Marital status: Married    Spouse name: N/A  . Number of children: N/A  . Years of education: N/A   Occupational History  . Not on file.   Social History Main Topics  . Smoking status: Never Smoker  . Smokeless tobacco: Never Used  . Alcohol use No  . Drug use: No  . Sexual activity: Not on file   Other Topics Concern  . Not on file   Social History Narrative  . No narrative on file     Family History  Problem Relation Age of Onset  . Cancer Father     stomach   . Diabetes Father     s/p renal transplant  . Diabetes Brother   . Diabetes Maternal Grandmother   . Diabetes Paternal Grandmother   . Cancer Paternal Grandmother     colon  . Depression Other   . Diabetes Other   .  Hypertension Other   . Kidney disease Other   . Arthritis Mother      Review of Systems   Positive for headaches and back pain radiating to his right leg-he has been diagnosed with sciatica  Constitutional: negative for anorexia, fevers and sweats  Eyes: negative for irritation, redness and visual disturbance  Ears, nose, mouth, throat, and face: negative for earaches, epistaxis, nasal congestion and sore throat  Respiratory: negative for cough, dyspnea on exertion, sputum and wheezing  Cardiovascular: negative for chest pain, dyspnea, lower extremity edema, orthopnea, palpitations and syncope  Gastrointestinal: negative for abdominal pain, constipation, diarrhea, melena, nausea and vomiting  Genitourinary:negative for dysuria, frequency and hematuria    Hematologic/lymphatic: negative for bleeding, easy bruising and lymphadenopathy  Musculoskeletal:negative for arthralgias, muscle weakness and stiff joints  Neurological: negative for coordination problems, gait problems, headaches and weakness  Endocrine: negative for diabetic symptoms including polydipsia, polyuria and weight loss     Objective:   Physical Exam  Gen. Pleasant, obese, in no distress, normal affect ENT - no lesions, no post nasal drip, class 2 airway Neck: No JVD, no thyromegaly, no carotid bruits Lungs: no use of accessory muscles, no dullness to percussion, decreased without rales or rhonchi  Cardiovascular: Rhythm regular, heart sounds  normal, no murmurs or gallops, no peripheral edema Abdomen: soft and non-tender, no hepatosplenomegaly, BS normal. Musculoskeletal: No deformities, no cyanosis or clubbing Neuro:  alert, non focal, no tremors       Assessment & Plan:

## 2016-06-21 NOTE — Assessment & Plan Note (Signed)
Home sleep study Based on this, we will try and get you a new CPAP machine with auto settings    The pathophysiology of obstructive sleep apnea , it's cardiovascular consequences & modes of treatment including CPAP were discused with the patient in detail & they evidenced understanding.

## 2016-07-07 ENCOUNTER — Telehealth: Payer: Self-pay | Admitting: Internal Medicine

## 2016-07-07 MED ORDER — HYDROCODONE-ACETAMINOPHEN 10-325 MG PO TABS: 1.0000 | ORAL_TABLET | Freq: Four times a day (QID) | ORAL | 0 refills | Status: DC | PRN

## 2016-07-07 NOTE — Telephone Encounter (Signed)
Rx ready for pickup. Rx printed and signed. A detailed message was left on pt's voicemail.

## 2016-07-07 NOTE — Telephone Encounter (Signed)
Pt need new Rx for Hydrocodone   Pt is aware of 3 business days for refills. °

## 2016-07-21 ENCOUNTER — Encounter: Payer: Self-pay | Admitting: Gastroenterology

## 2016-07-21 DIAGNOSIS — G4733 Obstructive sleep apnea (adult) (pediatric): Secondary | ICD-10-CM | POA: Diagnosis not present

## 2016-07-24 ENCOUNTER — Telehealth: Payer: Self-pay | Admitting: Pulmonary Disease

## 2016-07-24 DIAGNOSIS — G4733 Obstructive sleep apnea (adult) (pediatric): Secondary | ICD-10-CM

## 2016-07-24 NOTE — Telephone Encounter (Signed)
Per Dr. Elsworth Soho, HST did not show OSA. Suggestions include repeat as an in lab sleep study or discontinue CPAP.

## 2016-07-24 NOTE — Telephone Encounter (Signed)
Left message for patient to call back  

## 2016-07-26 ENCOUNTER — Other Ambulatory Visit: Payer: Self-pay | Admitting: *Deleted

## 2016-07-26 DIAGNOSIS — G4733 Obstructive sleep apnea (adult) (pediatric): Secondary | ICD-10-CM

## 2016-07-26 NOTE — Telephone Encounter (Signed)
lmomtcb x 2  

## 2016-07-27 ENCOUNTER — Telehealth (INDEPENDENT_AMBULATORY_CARE_PROVIDER_SITE_OTHER): Payer: Self-pay | Admitting: Physical Medicine and Rehabilitation

## 2016-07-27 NOTE — Telephone Encounter (Signed)
ok 

## 2016-07-27 NOTE — Telephone Encounter (Signed)
Left message for patient to call back to schedule.  °

## 2016-07-28 NOTE — Telephone Encounter (Signed)
Order has been placed.

## 2016-07-28 NOTE — Telephone Encounter (Signed)
Did you contact the pt?

## 2016-07-28 NOTE — Telephone Encounter (Signed)
Left message for pt to return call for scheduling.

## 2016-07-28 NOTE — Telephone Encounter (Signed)
Walter Jordan called me back and ask if he could do the in-lab study

## 2016-07-28 NOTE — Telephone Encounter (Signed)
Ok to order split study

## 2016-08-02 NOTE — Telephone Encounter (Signed)
Patient scheduled for 08/15/16 at 1330.

## 2016-08-15 ENCOUNTER — Ambulatory Visit (INDEPENDENT_AMBULATORY_CARE_PROVIDER_SITE_OTHER): Payer: 59 | Admitting: Physical Medicine and Rehabilitation

## 2016-08-15 ENCOUNTER — Encounter (INDEPENDENT_AMBULATORY_CARE_PROVIDER_SITE_OTHER): Payer: Self-pay | Admitting: Physical Medicine and Rehabilitation

## 2016-08-15 ENCOUNTER — Ambulatory Visit (INDEPENDENT_AMBULATORY_CARE_PROVIDER_SITE_OTHER): Payer: 59

## 2016-08-15 VITALS — BP 143/85 | HR 72

## 2016-08-15 DIAGNOSIS — M5116 Intervertebral disc disorders with radiculopathy, lumbar region: Secondary | ICD-10-CM

## 2016-08-15 DIAGNOSIS — M5416 Radiculopathy, lumbar region: Secondary | ICD-10-CM | POA: Diagnosis not present

## 2016-08-15 DIAGNOSIS — M546 Pain in thoracic spine: Secondary | ICD-10-CM

## 2016-08-15 MED ORDER — LIDOCAINE HCL (PF) 1 % IJ SOLN
0.3300 mL | Freq: Once | INTRAMUSCULAR | Status: AC
  Administered 2016-08-15: 0.3 mL

## 2016-08-15 MED ORDER — CYCLOBENZAPRINE HCL 10 MG PO TABS: 10.0000 mg | ORAL_TABLET | Freq: Three times a day (TID) | ORAL | 1 refills | Status: DC | PRN

## 2016-08-15 MED ORDER — METHYLPREDNISOLONE ACETATE 80 MG/ML IJ SUSP
80.0000 mg | Freq: Once | INTRAMUSCULAR | Status: AC
  Administered 2016-08-15: 80 mg

## 2016-08-15 NOTE — Progress Notes (Signed)
Walter Jordan. - 51 y.o. male MRN [id number]  Date of birth: 01/02/1966  Office Visit Note: Visit Date: 08/15/2016 PCP: Nyoka Cowden, MD Referred by: Marletta Lor, MD  Subjective: Chief Complaint  Patient presents with  . Lower Back - Pain   HPI:  Walter Jordan is a 51 year old gentleman still active and working but with significant degenerative spine for his age. He comes in today with chronic worsening severe right side low back pain. He reports burning pain into  right calf with laying down. Cant sleep on right side. Radiates down right leg. Foot feels numb at times.  He also gets some pain referral into the left hip but not down the leg. These symptoms are not new and these are chronic symptoms he has the seemingly flareup from time to time. These have been relieved to a degree with epidural injection and medication management and activity modification. He has seen spine surgeon in the past for evaluation he continues to have this chronic pain. We have seen him off and on for epidural injection at L4 and his MRI is reviewed below which shows more problems at the L4 issue. Unfortunately has some extra foraminal disc protrusion particularly on the right. Again depending on relief with epidural injection medication I would have him seen by a spine surgeon and he may be a good candidate for spinal cord stimulator trial.   He reports somewhat of a new problem with tightness in the center of low back radiating straight up the center of back mid way for around 1 week.  Doesn't recall any injury or incident that caused this. The mid back pain across the shoulder blade region is constant. He says he feels afraid to take a deep breath for fear of it hurting more. Yesterday he lifted something over his head and had tremendous pain. He reports that the methocarbamol muscle relaxer that we have given him really doesn't help him very much. He states in the past that Flexeril is helpful most.  He denies any weakness in the hands. He does report anxiety about the pain. He denies any specific chest pain or shortness of breath. He has not any changes in bowel or bladder function. He's had no unintended weight loss.    Review of Systems  Constitutional: Negative for chills, fever, malaise/fatigue and weight loss.  HENT: Negative for hearing loss and sinus pain.   Eyes: Negative for blurred vision, double vision and photophobia.  Respiratory: Negative for cough and shortness of breath.   Cardiovascular: Negative for chest pain, palpitations and leg swelling.  Gastrointestinal: Negative for abdominal pain, nausea and vomiting.  Genitourinary: Negative for flank pain.  Musculoskeletal: Positive for back pain. Negative for myalgias.  Skin: Negative for itching and rash.  Neurological: Positive for tingling. Negative for tremors, focal weakness and weakness.  Endo/Heme/Allergies: Negative.   Psychiatric/Behavioral: Negative for depression.  All other systems reviewed and are negative.  Otherwise per HPI.  Assessment & Plan: Visit Diagnoses:  1. Lumbar radiculopathy   2. Acute midline thoracic back pain   3. Radiculopathy due to lumbar intervertebral disc disorder     Plan: Findings:  Chronic worsening axial low back pain worse to the right with some radicular-type pattern on the right. In the past we have completed L4 transforaminal injections with pretty good relief over several months. I spoke with him about this today and he does want to repeat that. I did not discuss with him today spinal cord stimulator  trial of think that is something he might be a good candidate for depending on if there are any surgical resolution to this. Again a lot of his symptoms seem to be discogenic in the more lateral which can be more of a difficult surgery. He also is having thoracic pain over the last week which is fairly acute. No specific injury noted. He has very tight musculature throughout the  lumbar spine and just in general he is very tense muscular person. I think this is more muscular in the thoracic spine. I did write a prescription for Flexeril. We talked about stretching and movement. We'll see how this does over the next few weeks obviously if it worsens we could look at further imaging or possibly regrouping with a physical therapist. I spent more than 25 minutes speaking face-to-face with the patient with 50% of the time in counseling.    Meds & Orders:  Meds ordered this encounter  Medications  . lidocaine (PF) (XYLOCAINE) 1 % injection 0.3 mL  . methylPREDNISolone acetate (DEPO-MEDROL) injection 80 mg  . cyclobenzaprine (FLEXERIL) 10 MG tablet    Sig: Take 1 tablet (10 mg total) by mouth 3 (three) times daily as needed for muscle spasms.    Dispense:  60 tablet    Refill:  1    Orders Placed This Encounter  Procedures  . XR C-ARM NO REPORT  . Epidural Steroid injection    Follow-up: Return if symptoms worsen or fail to improve.   Procedures: No procedures performed  Lumbosacral Transforaminal Epidural Steroid Injection - Infraneural Approach with Fluoroscopic Guidance  Patient: Walter Jordan.      Date of Birth: 1966-02-07 MRN: [id number] PCP: Nyoka Cowden, MD      Visit Date: 08/15/2016   Universal Protocol:    Date/Time: 03/29/185:50 AM  Consent Given By: the patient  Position: PRONE   Additional Comments: Vital signs were monitored before and after the procedure. Patient was prepped and draped in the usual sterile fashion. The correct patient, procedure, and site was verified.   Injection Procedure Details:  Procedure Site One Meds Administered:  Meds ordered this encounter  Medications  . lidocaine (PF) (XYLOCAINE) 1 % injection 0.3 mL  . methylPREDNISolone acetate (DEPO-MEDROL) injection 80 mg  . cyclobenzaprine (FLEXERIL) 10 MG tablet    Sig: Take 1 tablet (10 mg total) by mouth 3 (three) times daily as needed for muscle  spasms.    Dispense:  60 tablet    Refill:  1      Laterality: Right  Location/Site:  L4-L5  Needle size: 22 G  Needle type: Spinal  Needle Placement: Transforaminal  Findings:  -Contrast Used: 1 mL iohexol 180 mg iodine/mL   -Comments: Excellent flow of contrast along the nerve and into the epidural space.  Procedure Details: After squaring off the end-plates of the desired vertebral level to get a true AP view, the C-arm was obliqued to the painful side so that the superior articulating process is positioned about 1/3 the length of the inferior endplate.  The needle was aimed toward the junction of the superior articular process and the transverse process of the inferior vertebrae. The needle's initial entry is in the lower third of the foramen through Kambin's triangle. The soft tissues overlying this target were infiltrated with 2-3 ml. of 1% Lidocaine without Epinephrine.  The spinal needle was then inserted and advanced toward the target using a "trajectory" view along the fluoroscope beam.  Under AP and lateral  visualization, the needle was advanced so it did not puncture dura and did not traverse medially beyond the 6 o'clock position of the pedicle. Bi-planar projections were used to confirm position. Aspiration was confirmed to be negative for CSF and/or blood. A 1-2 ml. volume of Isovue-250 was injected and flow of contrast was noted at each level. Radiographs were obtained for documentation purposes.   After attaining the desired flow of contrast documented above, a 0.5 to 1.0 ml test dose of 0.25% Marcaine was injected into each respective transforaminal space.  The patient was observed for 90 seconds post injection.  After no sensory deficits were reported, and normal lower extremity motor function was noted,   the above injectate was administered so that equal amounts of the injectate were placed at each foramen (level) into the transforaminal epidural space.   Additional  Comments:  The patient tolerated the procedure well Dressing: Band-Aid    Post-procedure details: Patient was observed during the procedure. Post-procedure instructions were reviewed.  Patient left the clinic in stable condition.   Clinical History: Lumbar spine MRI 07/28/2014  L4-5: Moderate to prominent left foraminal stenosis with moderate to prominent displacement of the left L4 nerve in the lateral extraforaminal space due to left foraminal and lateral extraforaminal disc osteophyte complex along with facet arthropathy. There is also mild right foraminal stenosis and moderate left subarticular lateral recess stenosis due to the underlying disc bulge and facet arthropathy. There is only minimal progression compared to the prior exam.  L5-S1: Moderate bilateral foraminal stenosis with mild displacement of the left L5 nerve in the lateral extraforaminal space and borderline bilateral subarticular lateral recess stenosis due to disc bulge, intervertebral spurring, and facet spurring. Findings mildly progressive compared to the prior exam.  IMPRESSION: 1. Slightly progressive lumbar spondylosis and degenerative disc disease, resulting in moderate to prominent impingement at L4-5; moderate impingement at L2-3 and L5-S1; and mild impingement at L3-4, as detailed above.  He reports that he has never smoked. He has never used smokeless tobacco. No results for input(s): HGBA1C, LABURIC in the last 8760 hours.  Objective:  VS:  HT:    WT:   BMI:     BP:(!) 143/85  HR:72bpm  TEMP: ( )  RESP:98 % Physical Exam  Constitutional: He is oriented to person, place, and time. He appears well-developed and well-nourished. No distress.  HENT:  Head: Normocephalic and atraumatic.  Nose: Nose normal.  Mouth/Throat: Oropharynx is clear and moist.  Eyes: Conjunctivae are normal. Pupils are equal, round, and reactive to light.  Neck: Normal range of motion. Neck supple. No tracheal  deviation present.  Cardiovascular: Regular rhythm and intact distal pulses.   Pulmonary/Chest: Effort normal. No respiratory distress. He has no wheezes. He exhibits no tenderness.  Abdominal: Soft. He exhibits no distension.  Musculoskeletal: He exhibits no deformity.  Examination of the thoracic spine shows no scoliosis or kyphosis. He is somewhat tender paraspinally in the longissimus muscles with somewhat of active trigger points and also in the rhomboids. He is very tight in the muscles in general. He has good strength in the upper extremities bilaterally without any deficits. There is a negative Hoffmann's test bilaterally. Examination of the lower extremities shows good distal strength without clonus. He has no pain with hip rotation is concordant pain with extension of the lumbar spine.  Lymphadenopathy:    He has no cervical adenopathy.  Neurological: He is alert and oriented to person, place, and time. He exhibits normal muscle tone. Coordination  normal.  Skin: Skin is warm. No rash noted.  Psychiatric: He has a normal mood and affect. His behavior is normal.  Nursing note and vitals reviewed.   Ortho Exam Imaging: No results found.  Past Medical/Family/Surgical/Social History: Medications & Allergies reviewed per EMR Patient Active Problem List   Diagnosis Date Noted  . Lumbar radiculopathy 05/11/2016  . Febrile illness, acute 04/30/2012  . Impaired glucose tolerance 03/25/2012  . VERTIGO 02/22/2010  . HEADACHE 02/22/2010  . Obstructive sleep apnea 09/27/2009  . ERECTILE DYSFUNCTION, ORGANIC 09/07/2009  . SLEEP APNEA 09/07/2009  . CONSTIPATION 03/13/2008  . RECTAL BLEEDING 03/13/2008  . CHEST PAIN 03/13/2008  . OTHER SYMPTOMS INVOLVING DIGESTIVE SYSTEM OTHER 03/12/2008  . RECTAL BLEEDING, HX OF 03/12/2008  . Hypothyroidism 02/22/2007  . Allergic rhinitis 02/22/2007  . GERD 02/22/2007  . LOW BACK PAIN 02/22/2007   Past Medical History:  Diagnosis Date  . Allergic  rhinitis   . ED (erectile dysfunction)   . GERD (gastroesophageal reflux disease)   . Hypothyroidism   . Low back pain   . OSA (obstructive sleep apnea)    Family History  Problem Relation Age of Onset  . Cancer Father     stomach   . Diabetes Father     s/p renal transplant  . Diabetes Brother   . Diabetes Maternal Grandmother   . Diabetes Paternal Grandmother   . Cancer Paternal Grandmother     colon  . Depression Other   . Diabetes Other   . Hypertension Other   . Kidney disease Other   . Arthritis Mother    Past Surgical History:  Procedure Laterality Date  . HERNIA REPAIR     x2 1968, abd 1973   Social History   Occupational History  . Not on file.   Social History Main Topics  . Smoking status: Never Smoker  . Smokeless tobacco: Never Used  . Alcohol use No  . Drug use: No  . Sexual activity: Not on file

## 2016-08-15 NOTE — Patient Instructions (Signed)

## 2016-08-17 NOTE — Procedures (Signed)
Lumbosacral Transforaminal Epidural Steroid Injection - Infraneural Approach with Fluoroscopic Guidance  Patient: Walter Jordan.      Date of Birth: 25-Sep-1965 MRN: [id number] PCP: Nyoka Cowden, MD      Visit Date: 08/15/2016   Universal Protocol:    Date/Time: 03/29/185:50 AM  Consent Given By: the patient  Position: PRONE   Additional Comments: Vital signs were monitored before and after the procedure. Patient was prepped and draped in the usual sterile fashion. The correct patient, procedure, and site was verified.   Injection Procedure Details:  Procedure Site One Meds Administered:  Meds ordered this encounter  Medications  . lidocaine (PF) (XYLOCAINE) 1 % injection 0.3 mL  . methylPREDNISolone acetate (DEPO-MEDROL) injection 80 mg  . cyclobenzaprine (FLEXERIL) 10 MG tablet    Sig: Take 1 tablet (10 mg total) by mouth 3 (three) times daily as needed for muscle spasms.    Dispense:  60 tablet    Refill:  1      Laterality: Right  Location/Site:  L4-L5  Needle size: 22 G  Needle type: Spinal  Needle Placement: Transforaminal  Findings:  -Contrast Used: 1 mL iohexol 180 mg iodine/mL   -Comments: Excellent flow of contrast along the nerve and into the epidural space.  Procedure Details: After squaring off the end-plates of the desired vertebral level to get a true AP view, the C-arm was obliqued to the painful side so that the superior articulating process is positioned about 1/3 the length of the inferior endplate.  The needle was aimed toward the junction of the superior articular process and the transverse process of the inferior vertebrae. The needle's initial entry is in the lower third of the foramen through Kambin's triangle. The soft tissues overlying this target were infiltrated with 2-3 ml. of 1% Lidocaine without Epinephrine.  The spinal needle was then inserted and advanced toward the target using a "trajectory" view along the fluoroscope  beam.  Under AP and lateral visualization, the needle was advanced so it did not puncture dura and did not traverse medially beyond the 6 o'clock position of the pedicle. Bi-planar projections were used to confirm position. Aspiration was confirmed to be negative for CSF and/or blood. A 1-2 ml. volume of Isovue-250 was injected and flow of contrast was noted at each level. Radiographs were obtained for documentation purposes.   After attaining the desired flow of contrast documented above, a 0.5 to 1.0 ml test dose of 0.25% Marcaine was injected into each respective transforaminal space.  The patient was observed for 90 seconds post injection.  After no sensory deficits were reported, and normal lower extremity motor function was noted,   the above injectate was administered so that equal amounts of the injectate were placed at each foramen (level) into the transforaminal epidural space.   Additional Comments:  The patient tolerated the procedure well Dressing: Band-Aid    Post-procedure details: Patient was observed during the procedure. Post-procedure instructions were reviewed.  Patient left the clinic in stable condition.

## 2016-09-05 ENCOUNTER — Telehealth: Payer: Self-pay | Admitting: Internal Medicine

## 2016-09-05 NOTE — Telephone Encounter (Signed)
Okay 30 day supply 

## 2016-09-05 NOTE — Telephone Encounter (Signed)
° ° °  Pt request refill of the following: ° ° °HYDROcodone-acetaminophen (NORCO) 10-325 MG tablet ° ° °Phamacy: °

## 2016-09-06 MED ORDER — HYDROCODONE-ACETAMINOPHEN 10-325 MG PO TABS: 1.0000 | ORAL_TABLET | Freq: Four times a day (QID) | ORAL | 0 refills | Status: DC | PRN

## 2016-09-06 NOTE — Telephone Encounter (Signed)
See MD note.

## 2016-09-06 NOTE — Telephone Encounter (Signed)
Rx printed awaiting to be signed.  

## 2016-09-06 NOTE — Telephone Encounter (Signed)
Unable to leave a message to inform pt that Rx is ready for pick.

## 2016-09-08 DIAGNOSIS — R3911 Hesitancy of micturition: Secondary | ICD-10-CM | POA: Diagnosis not present

## 2016-09-08 DIAGNOSIS — R3912 Poor urinary stream: Secondary | ICD-10-CM | POA: Diagnosis not present

## 2016-09-08 DIAGNOSIS — N401 Enlarged prostate with lower urinary tract symptoms: Secondary | ICD-10-CM | POA: Diagnosis not present

## 2016-09-10 ENCOUNTER — Ambulatory Visit (HOSPITAL_BASED_OUTPATIENT_CLINIC_OR_DEPARTMENT_OTHER): Payer: 59 | Attending: Pulmonary Disease | Admitting: Pulmonary Disease

## 2016-09-10 VITALS — Ht 71.0 in | Wt 223.0 lb

## 2016-09-10 DIAGNOSIS — R5383 Other fatigue: Secondary | ICD-10-CM | POA: Diagnosis not present

## 2016-09-10 DIAGNOSIS — G4733 Obstructive sleep apnea (adult) (pediatric): Secondary | ICD-10-CM | POA: Diagnosis present

## 2016-09-11 ENCOUNTER — Ambulatory Visit (AMBULATORY_SURGERY_CENTER): Payer: Self-pay | Admitting: *Deleted

## 2016-09-11 VITALS — Ht 71.0 in | Wt 224.0 lb

## 2016-09-11 DIAGNOSIS — Z1211 Encounter for screening for malignant neoplasm of colon: Secondary | ICD-10-CM

## 2016-09-11 MED ORDER — NA SULFATE-K SULFATE-MG SULF 17.5-3.13-1.6 GM/177ML PO SOLN: 1.0000 | Freq: Once | ORAL | 0 refills | Status: AC

## 2016-09-11 NOTE — Progress Notes (Signed)
No egg or soy allergy known to patient  No issues with past sedation with any surgeries  or procedures, no intubation problems  No diet pills per patient No home 02 use per patient  No blood thinners per patient  Pt denies issues with constipation  No A fib or A flutter  EMMI video sent to pt's e mail  $15 coupon for suprep to pt,.

## 2016-09-12 ENCOUNTER — Encounter: Payer: Self-pay | Admitting: Gastroenterology

## 2016-09-14 ENCOUNTER — Telehealth: Payer: Self-pay | Admitting: Pulmonary Disease

## 2016-09-14 DIAGNOSIS — G4733 Obstructive sleep apnea (adult) (pediatric): Secondary | ICD-10-CM | POA: Diagnosis not present

## 2016-09-14 NOTE — Procedures (Signed)
Patient Name: Jacquel, Mccamish Date: 09/10/2016 Gender: Male D.O.B: 10-18-1965 Age (years): 9 Referring Provider: Kara Mead MD, ABSM Height (inches): 71 Interpreting Physician: Kara Mead MD, ABSM Weight (lbs): 223 RPSGT: Baxter Flattery BMI: 31 MRN: [id number] Neck Size: 18.50   CLINICAL INFORMATION Sleep Study Type: NPSG  Indication for sleep study: Excessive Daytime Sleepiness, Fatigue, OSA, Snoring, Witnessed Apneas  Epworth Sleepiness Score: 9     SLEEP STUDY TECHNIQUE As per the AASM Manual for the Scoring of Sleep and Associated Events v2.3 (April 2016) with a hypopnea requiring 4% desaturations.  The channels recorded and monitored were frontal, central and occipital EEG, electrooculogram (EOG), submentalis EMG (chin), nasal and oral airflow, thoracic and abdominal wall motion, anterior tibialis EMG, snore microphone, electrocardiogram, and pulse oximetry.  MEDICATIONS Medications self-administered by patient taken the night of the study : CYCLOBENZAPRINE HCL  SLEEP ARCHITECTURE The study was initiated at 10:15:47 PM and ended at 4:55:51 AM.  Sleep onset time was 8.6 minutes and the sleep efficiency was 91.4%. The total sleep time was 365.5 minutes.  Stage REM latency was 166.0 minutes.  The patient spent 8.35% of the night in stage N1 sleep, 79.75% in stage N2 sleep, 0.00% in stage N3 and 11.90% in REM.  Alpha intrusion was absent.  Supine sleep was 39.80%.  RESPIRATORY PARAMETERS The overall apnea/hypopnea index (AHI) was 4.4 per hour with RDI 19.5/h. There were 4 total apneas, including 4 obstructive, 0 central and 0 mixed apneas. There were 23 hypopneas and 92 RERAs.  The AHI during Stage REM sleep was 2.8 per hour.  AHI while supine was 10.7 per hour.  The mean oxygen saturation was 96.72%. The minimum SpO2 during sleep was 92.00%.  Loud snoring was noted during this study.  CARDIAC DATA The 2 lead EKG demonstrated sinus rhythm. The mean heart  rate was 69.09 beats per minute. Other EKG findings include: None.   LEG MOVEMENT DATA The total PLMS were 0 with a resulting PLMS index of 0.00. Associated arousal with leg movement index was 0.0 .  IMPRESSIONS - Mild obstructive sleep apnea occurred during this study with predominant RERAs (RDI = 19.5/h). - No significant central sleep apnea occurred during this study (CAI = 0.0/h). - The patient had minimal or no oxygen desaturation during the study (Min O2 = 92.00%) - The patient snored with Loud snoring volume. - No cardiac abnormalities were noted during this study. - Clinically significant periodic limb movements did not occur during sleep. No significant associated arousals.   DIAGNOSIS - Mild obstructive sleep apnea   RECOMMENDATIONS - Consider CPAP therapy, auto settings can be used - Avoid alcohol, sedatives and other CNS depressants that may worsen sleep apnea and disrupt normal sleep architecture. - Sleep hygiene should be reviewed to assess factors that may improve sleep quality. - Weight management and regular exercise should be initiated or continued if appropriate.   Kara Mead MD Board Certified in [place]

## 2016-09-14 NOTE — Telephone Encounter (Signed)
Mild oSA with predominnat RERAs (RDI 19/h) We will sendRx for autoCPAP 5-15 cm, mask of choice, DL in 4 wks OV in 6 wks

## 2016-09-20 ENCOUNTER — Telehealth: Payer: Self-pay | Admitting: Gastroenterology

## 2016-09-20 NOTE — Telephone Encounter (Signed)
Suprep was going to be $140. Suprep sample kit x 1 left at front desk on 4 th floor. Lot #[id number] exp. 01/20. Pt aware.

## 2016-09-25 ENCOUNTER — Ambulatory Visit (AMBULATORY_SURGERY_CENTER): Payer: 59 | Admitting: Gastroenterology

## 2016-09-25 ENCOUNTER — Encounter: Payer: Self-pay | Admitting: Gastroenterology

## 2016-09-25 VITALS — BP 111/64 | HR 75 | Temp 98.0°F | Resp 12 | Ht 71.0 in | Wt 224.0 lb

## 2016-09-25 DIAGNOSIS — D124 Benign neoplasm of descending colon: Secondary | ICD-10-CM | POA: Diagnosis not present

## 2016-09-25 DIAGNOSIS — Z1212 Encounter for screening for malignant neoplasm of rectum: Secondary | ICD-10-CM

## 2016-09-25 DIAGNOSIS — Z1211 Encounter for screening for malignant neoplasm of colon: Secondary | ICD-10-CM

## 2016-09-25 MED ORDER — SODIUM CHLORIDE 0.9 % IV SOLN: 500.0000 mL | INTRAVENOUS | Status: DC

## 2016-09-25 NOTE — Progress Notes (Signed)
Pt's states no medical or surgical changes since previsit or office visit. maw 

## 2016-09-25 NOTE — Patient Instructions (Signed)
Handout given on polyps  YOU HAD AN ENDOSCOPIC PROCEDURE TODAY: Refer to the procedure report and other information in the discharge instructions given to you for any specific questions about what was found during the examination. If this information does not answer your questions, please call [place] office at [phone] to clarify.   YOU SHOULD EXPECT: Some feelings of bloating in the abdomen. Passage of more gas than usual. Walking can help get rid of the air that was put into your GI tract during the procedure and reduce the bloating. If you had a lower endoscopy (such as a colonoscopy or flexible sigmoidoscopy) you may notice spotting of blood in your stool or on the toilet paper. Some abdominal soreness may be present for a day or two, also.  DIET: Your first meal following the procedure should be a light meal and then it is ok to progress to your normal diet. A half-sandwich or bowl of soup is an example of a good first meal. Heavy or fried foods are harder to digest and may make you feel nauseous or bloated. Drink plenty of fluids but you should avoid alcoholic beverages for 24 hours. If you had a esophageal dilation, please see attached instructions for diet.    ACTIVITY: Your care partner should take you home directly after the procedure. You should plan to take it easy, moving slowly for the rest of the day. You can resume normal activity the day after the procedure however YOU SHOULD NOT DRIVE, use power tools, machinery or perform tasks that involve climbing or major physical exertion for 24 hours (because of the sedation medicines used during the test).   SYMPTOMS TO REPORT IMMEDIATELY: A gastroenterologist can be reached at any hour. Please call [phone]  for any of the following symptoms:  Following lower endoscopy (colonoscopy, flexible sigmoidoscopy) Excessive amounts of blood in the stool  Significant tenderness, worsening of abdominal pains  Swelling of the abdomen that is  new, acute  Fever of 100 or higher    FOLLOW UP:  If any biopsies were taken you will be contacted by phone or by letter within the next 1-3 weeks. Call [phone]  if you have not heard about the biopsies in 3 weeks.  Please also call with any specific questions about appointments or follow up tests.  

## 2016-09-25 NOTE — Progress Notes (Signed)
Called to room to assist during endoscopic procedure.  Patient ID and intended procedure confirmed with present staff. Received instructions for my participation in the procedure from the performing physician.  

## 2016-09-25 NOTE — Op Note (Signed)
[facility] Patient Name: Walter Jordan Procedure Date: 09/25/2016 10:29 AM MRN: [id number] Endoscopist: Mallie Mussel L. Loletha Carrow , MD Age: 51 Referring MD:  Date of Birth: [birth year] Gender: Male Account #: [id number] Procedure:                Colonoscopy Indications:              Screening for colorectal malignant neoplasm, This                            is the patient's first screening colonoscopy Medicines:                Monitored Anesthesia Care Procedure:                Pre-Anesthesia Assessment:                           - Prior to the procedure, a History and Physical                            was performed, and patient medications and                            allergies were reviewed. The patient's tolerance of                            previous anesthesia was also reviewed. The risks                            and benefits of the procedure and the sedation                            options and risks were discussed with the patient.                            All questions were answered, and informed consent                            was obtained. Prior Anticoagulants: The patient has                            taken no previous anticoagulant or antiplatelet                            agents. ASA Grade Assessment: II - A patient with                            mild systemic disease. After reviewing the risks                            and benefits, the patient was deemed in                            satisfactory condition to undergo the procedure.  After obtaining informed consent, the colonoscope                            was passed under direct vision. Throughout the                            procedure, the patient's blood pressure, pulse, and                            oxygen saturations were monitored continuously. The                            Colonoscope was introduced through the anus and                            advanced to the the  cecum, identified by                            appendiceal orifice and ileocecal valve. The                            colonoscopy was performed without difficulty. The                            patient tolerated the procedure well. The quality                            of the bowel preparation was good. The ileocecal                            valve, appendiceal orifice, and rectum were                            photographed. The quality of the bowel preparation                            was evaluated using the BBPS [facility] Bowel                            Preparation Scale) with scores of: Right Colon = 2,                            Transverse Colon = 3 and Left Colon = 2. The total                            BBPS score equals 7. The bowel preparation used was                            SUPREP. Scope In: 10:43:19 AM Scope Out: 06:26:94 AM Scope Withdrawal Time: 0 hours 6 minutes 59 seconds  Total Procedure Duration: 0 hours 13 minutes 39 seconds  Findings:                 The perianal and digital rectal  examinations were                            normal.                           A 4 mm polyp was found in the distal descending                            colon. The polyp was sessile. The polyp was removed                            with a cold snare. Resection and retrieval were                            complete.                           Internal hemorrhoids were found during                            retroflexion. The hemorrhoids were small and Grade                            I (internal hemorrhoids that do not prolapse).                           The exam was otherwise without abnormality on                            direct and retroflexion views. Complications:            No immediate complications. Estimated Blood Loss:     Estimated blood loss: none. Impression:               - One 4 mm polyp in the distal descending colon,                            removed with a  cold snare. Resected and retrieved.                           - Internal hemorrhoids.                           - The examination was otherwise normal on direct                            and retroflexion views. Recommendation:           - Patient has a contact number available for                            emergencies. The signs and symptoms of potential                            delayed complications were discussed with the  patient. Return to normal activities tomorrow.                            Written discharge instructions were provided to the                            patient.                           - Resume previous diet.                           - Continue present medications.                           - Await pathology results.                           - Repeat colonoscopy is recommended for                            surveillance. The colonoscopy date will be                            determined after pathology results from today's                            exam become available for review. Sonika Levins L. Loletha Carrow, MD 09/25/2016 11:01:08 AM This report has been signed electronically.

## 2016-09-25 NOTE — Progress Notes (Signed)
A/ox3, pleased with MAC, report to RN 

## 2016-09-26 ENCOUNTER — Telehealth: Payer: Self-pay | Admitting: *Deleted

## 2016-09-26 ENCOUNTER — Encounter: Payer: Self-pay | Admitting: Family Medicine

## 2016-09-26 ENCOUNTER — Ambulatory Visit (INDEPENDENT_AMBULATORY_CARE_PROVIDER_SITE_OTHER): Payer: 59 | Admitting: Family Medicine

## 2016-09-26 VITALS — BP 120/78 | HR 84 | Temp 98.9°F | Wt 224.4 lb

## 2016-09-26 DIAGNOSIS — M545 Low back pain, unspecified: Secondary | ICD-10-CM

## 2016-09-26 MED ORDER — PREDNISONE 10 MG PO TABS: ORAL_TABLET | ORAL | 0 refills | Status: DC

## 2016-09-26 NOTE — Telephone Encounter (Signed)
  Follow up Call-  Call back number 09/25/2016  Post procedure Call Back phone  # [phone] cell  Permission to leave phone message Yes  Some recent data might be hidden     Patient questions:  Do you have a fever, pain , or abdominal swelling? No. Pain Score  0 *  Have you tolerated food without any problems? Yes.    Have you been able to return to your normal activities? Yes.    Do you have any questions about your discharge instructions: Diet   No. Medications  No. Follow up visit  No.  Do you have questions or concerns about your Care? No.  Actions: * If pain score is 4 or above: No action needed, pain <4.

## 2016-09-26 NOTE — Patient Instructions (Signed)
Lumbosacral Strain Lumbosacral strain is an injury that causes pain in the lower back (lumbosacral spine). This injury usually occurs from overstretching the muscles or ligaments along your spine. A strain can affect one or more muscles or cord-like tissues that connect bones to other bones (ligaments). What are the causes? This condition may be caused by:  A hard, direct hit (blow) to the back.  Excessive stretching of the lower back muscles. This may result from: ? A fall. ? Lifting something heavy. ? Repetitive movements such as bending or crouching.  What increases the risk? The following factors may increase your risk of getting this condition:  Participating in sports or activities that involve: ? A sudden twist of the back. ? Pushing or pulling motions.  Being overweight or obese.  Having poor strength and flexibility, especially tight hamstrings or weak muscles in the back or abdomen.  Having too much of a curve in the lower back.  Having a pelvis that is tilted forward.  What are the signs or symptoms? The main symptom of this condition is pain in the lower back, at the site of the strain. Pain may extend (radiate) down one or both legs. How is this diagnosed? This condition is diagnosed based on:  Your symptoms.  Your medical history.  A physical exam. ? Your health care provider may push on certain areas of your back to determine the source of your pain. ? You may be asked to bend forward, backward, and side to side to assess the severity of your pain and your range of motion.  Imaging tests, such as: ? X-rays. ? MRI.  How is this treated? Treatment for this condition may include:  Putting heat and cold on the affected area.  Medicines to help relieve pain and relax your muscles (muscle relaxants).  NSAIDs to help reduce swelling and discomfort.  When your symptoms improve, it is important to gradually return to your normal routine as soon as possible  to reduce pain, avoid stiffness, and avoid loss of muscle strength. Generally, symptoms should improve within 6 weeks of treatment. However, recovery time varies. Follow these instructions at home: Managing pain, stiffness, and swelling   If directed, put ice on the injured area during the first 24 hours after your strain. ? Put ice in a plastic bag. ? Place a towel between your skin and the bag. ? Leave the ice on for 20 minutes, 2-3 times a day.  If directed, put heat on the affected area as often as told by your health care provider. Use the heat source that your health care provider recommends, such as a moist heat pack or a heating pad. ? Place a towel between your skin and the heat source. ? Leave the heat on for 20-30 minutes. ? Remove the heat if your skin turns bright red. This is especially important if you are unable to feel pain, heat, or cold. You may have a greater risk of getting burned. Activity  Rest and return to your normal activities as told by your health care provider. Ask your health care provider what activities are safe for you.  Avoid activities that take a lot of energy for as long as told by your health care provider. General instructions  Take over-the-counter and prescription medicines only as told by your health care provider.  Donot drive or use heavy machinery while taking prescription pain medicine.  Do not use any products that contain nicotine or tobacco, such as cigarettes and e-cigarettes.   If you need help quitting, ask your health care provider.  Keep all follow-up visits as told by your health care provider. This is important. How is this prevented?  Use correct form when playing sports and lifting heavy objects.  Use good posture when sitting and standing.  Maintain a healthy weight.  Sleep on a mattress with medium firmness to support your back.  Be safe and responsible while being active to avoid falls.  Do at least 150 minutes of  moderate-intensity exercise each week, such as brisk walking or water aerobics. Try a form of exercise that takes stress off your back, such as swimming or stationary cycling.  Maintain physical fitness, including: ? Strength. ? Flexibility. ? Cardiovascular fitness. ? Endurance. Contact a health care provider if:  Your back pain does not improve after 6 weeks of treatment.  Your symptoms get worse. Get help right away if:  Your back pain is severe.  You cannot stand or walk.  You have difficulty controlling when you urinate or when you have a bowel movement.  You feel nauseous or you vomit.  Your feet get very cold.  You have numbness, tingling, weakness, or problems using your arms or legs.  You develop any of the following: ? Shortness of breath. ? Dizziness. ? Pain in your legs. ? Weakness in your buttocks or legs. ? Discoloration of the skin on your toes or legs. This information is not intended to replace advice given to you by your health care provider. Make sure you discuss any questions you have with your health care provider. Document Released: 02/15/2005 Document Revised: 11/26/2015 Document Reviewed: 10/10/2015 [employer] Interactive Patient Education  2017 [employer]  

## 2016-09-26 NOTE — Progress Notes (Signed)
Subjective:     Patient ID: Walter Jordan., male   DOB: [birth year], 51 y.o.   MRN: [id number]  HPI Patient seen for left lumbar back pain. 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 His pain had gotten much worse by Saturday. He said pain left lower lumbar region without radiculitis symptoms. Much worse with changing positions. Denies any lower extremity numbness or weakness. No loss of urine or stool continence. He had leftover hydrocodone which he took last night along with Flexeril which helped somewhat.  Patient had MRI lumbosacral spine March 2016 which showed lumbar spondylosis and degenerative disc disease at multiple levels.  His pain is actually somewhat better with standing but is worse with position change.  Past Medical History:  Diagnosis Date  . Allergic rhinitis   . Allergy   . Arthritis    back  . ED (erectile dysfunction)   . GERD (gastroesophageal reflux disease)   . Hypothyroidism   . Low back pain   . OSA (obstructive sleep apnea)   . Sleep apnea    had sleep study 09-10-16- needs new cpap   Past Surgical History:  Procedure Laterality Date  . COLONOSCOPY  03/19/2008   ext. hems   . HERNIA REPAIR     x2 1968, abd 1973  . WISDOM TOOTH EXTRACTION    . WISDOM TOOTH EXTRACTION      reports that he has never smoked. He has never used smokeless tobacco. He reports that he drinks alcohol. He reports that he does not use drugs. family history includes Arthritis in his mother; Cancer in his father and paternal grandmother; Colon cancer in his paternal grandmother; Depression in his other; Diabetes in his brother, father, maternal grandmother, other, and paternal grandmother; Hypertension in his other; Kidney disease in his other. No Known Allergies     Review of Systems  Constitutional: Negative for activity change, appetite change and fever.  Respiratory: Negative for cough and shortness of breath.    Cardiovascular: Negative for chest pain and leg swelling.  Gastrointestinal: Negative for abdominal pain and vomiting.  Genitourinary: Negative for dysuria, flank pain and hematuria.  Musculoskeletal: Positive for back pain. Negative for joint swelling.  Neurological: Negative for weakness and numbness.       Objective:   Physical Exam  Constitutional: He is oriented to person, place, and time. He appears well-developed and well-nourished. No distress.  Neck: No thyromegaly present.  Cardiovascular: Normal rate, regular rhythm and normal heart sounds.   No murmur heard. Pulmonary/Chest: Effort normal and breath sounds normal. No respiratory distress. He has no wheezes. He has no rales.  Musculoskeletal: He exhibits no edema.  Straight leg raises are negative  Neurological: He is alert and oriented to person, place, and time. No cranial nerve deficit.  He has diminished patellar reflex on the right knee compare the left. Trace reflex ankle bilaterally. No strength deficits.  Skin: No rash noted.       Assessment:     Acute pain left lumbar region. History of known lumbar spondylosis    Plan:     -Prednisone taper starting at 40 mg daily and reviewed potential side effects -May continue with Flexeril as needed at night -He has some leftover hydrocodone to use as needed for severe pain -Continue with extension stretches which he has done in the past -Follow-up with primary if not improving by next week  Eulas Post MD [facility]

## 2016-09-29 ENCOUNTER — Encounter: Payer: Self-pay | Admitting: Gastroenterology

## 2016-09-29 ENCOUNTER — Telehealth: Payer: Self-pay | Admitting: Pulmonary Disease

## 2016-09-29 NOTE — Telephone Encounter (Signed)
See phone note from 09/14/16, confused because [facility] placed the order but nothing is documented that pt is aware that the order was placed  lmom tcb x1

## 2016-10-02 NOTE — Telephone Encounter (Signed)
lmtcb x2 for pt. 

## 2016-10-04 NOTE — Telephone Encounter (Signed)
Rigoberto Noel, MD      09/14/16 8:52 AM  Note    Mild oSA with predominnat RERAs (RDI 19/h) We will sendRx for autoCPAP 5-15 cm, mask of choice, DL in 4 wks OV in 6 wks       LMTCB

## 2016-10-05 NOTE — Telephone Encounter (Signed)
lmtcb for pt.  

## 2016-10-06 NOTE — Telephone Encounter (Signed)
lmomtcb x 1 for the pt 

## 2016-10-09 NOTE — Telephone Encounter (Signed)
Spoke with patient-he was never made aware of results of sleep study and therefore never contacted [facility] back to get set up with CPAP machine.    Pt is now aware of results and need for CPAP machine; will contact [facility] today for CPAP set up and will follow up with TP July  9,2018 at 9:00am as RA is booked.   Nothing more needed from our office at this time.

## 2016-10-09 NOTE — Telephone Encounter (Signed)
[phone] pt takes a break at 36

## 2016-10-09 NOTE — Telephone Encounter (Signed)
lmomtcb x 2 for the pt.  

## 2016-10-10 ENCOUNTER — Telehealth: Payer: Self-pay | Admitting: Pulmonary Disease

## 2016-10-10 NOTE — Telephone Encounter (Signed)
lmomtcb x1 

## 2016-10-11 NOTE — Telephone Encounter (Signed)
Will forward to RA to make aware CPAP not covered due to only 4 apneas

## 2016-10-11 NOTE — Telephone Encounter (Signed)
Melissa with [place] called confirming what the patient stated in this message.  The patient did not qualify for CPAP.  [employer] is now going by Medicare guidelines.  Melissa's CB is [phone].

## 2016-10-11 NOTE — Telephone Encounter (Signed)
Please let patient know that this is true In my opinion however, he does have  OSA although he did not meet criteria for insurance coverage Other alternatives include -Appeal to insurance , I can help him with this especially given that he had past mprovement with CPAP  -If that fails, he may have to buy his own machine

## 2016-10-12 NOTE — Telephone Encounter (Signed)
LM x 1 

## 2016-10-12 NOTE — Telephone Encounter (Signed)
lmtcb x2 for pt. 

## 2016-10-13 NOTE — Telephone Encounter (Signed)
Called and spoke with pt and he is aware of RA recs and would like to appeal to the insurance company for the cpap machine.  Will forward back to RA to make him aware.

## 2016-10-17 NOTE — Telephone Encounter (Signed)
Please send a letter to insurance   Walter Jordan is under care for sleep-disordered breathing. He has excessive daytime somnolence and loud snoring is been noted by family members. Overnight sleep study showed AHI of 4.4 with several respiratory effort-related arousals with RDI of 19.5/hour. In my opinion this does indicate that he has significant obstructive sleep-disordered breathing and would benefit from a CPAP machine. Would therefore request you to allow him to undertake a trial of auto CPAP machine. This will be followed by an office visit within 30-60 days to assess subjective improvement in symptoms and if so, then I would suggest to continue with this therapy until he is successful in losing some weight

## 2016-10-19 NOTE — Telephone Encounter (Signed)
Letter has been printed and signed by RA. Will call [employer] to get the correct fax number to appeals departments.

## 2016-10-19 NOTE — Telephone Encounter (Signed)
Letter has been faxed to [facility] Department. Fax [phone]. Will hold in my look-at folder until June 7th.

## 2016-10-24 ENCOUNTER — Telehealth: Payer: Self-pay | Admitting: Pulmonary Disease

## 2016-10-24 NOTE — Telephone Encounter (Signed)
Left message for Walter Jordan to call back.  

## 2016-10-25 NOTE — Telephone Encounter (Signed)
lmomtcb x 2 for Janett Billow

## 2016-10-26 NOTE — Telephone Encounter (Signed)
lmomtcb x 3 for [employer]

## 2016-10-26 NOTE — Telephone Encounter (Signed)
Will close this message until [place] calls back.

## 2016-11-27 ENCOUNTER — Ambulatory Visit: Payer: 59 | Admitting: Adult Health

## 2016-11-27 ENCOUNTER — Telehealth: Payer: Self-pay | Admitting: Pulmonary Disease

## 2016-11-27 NOTE — Telephone Encounter (Signed)
lmtcb x1 for pt. 

## 2016-11-29 NOTE — Telephone Encounter (Signed)
Patient returning call - he can be reached at [phone] -pr

## 2016-11-29 NOTE — Telephone Encounter (Signed)
LM x 2

## 2016-11-29 NOTE — Telephone Encounter (Signed)
Patient states that he is wanting to check the status of the Appeal for his CPAP A letter was faxed on 10/19/16 and it does not look like anything more has been done with this.   Valerie Salts, CMA  9:56 AM Note    Letter has been faxed to [facility]. Fax [phone]. Will hold in my look-at folder until June 7th.      Cherina do you have this information or a number to contact the Appeals Dept?

## 2016-11-30 ENCOUNTER — Telehealth: Payer: Self-pay | Admitting: Internal Medicine

## 2016-11-30 MED ORDER — HYDROCODONE-ACETAMINOPHEN 10-325 MG PO TABS: 1.0000 | ORAL_TABLET | Freq: Four times a day (QID) | ORAL | 0 refills | Status: DC | PRN

## 2016-11-30 NOTE — Telephone Encounter (Signed)
Pt need new Rx for Hydrocodone   Pt is aware of 3 business days for refills and someone will call when ready for pick up. °

## 2016-11-30 NOTE — Telephone Encounter (Signed)
Called patient's insurance and they stated appeal will need to be done over due to not providing CPT code.   Walter Jordan is there any way you can help provide a cpt code that we use as far as appeal insurance determination for coverage of a patient's cpap. If you need to refer to RA's letter please see phone message 10/10/16

## 2016-11-30 NOTE — Telephone Encounter (Signed)
Rx printed awaiting to be signed.  

## 2016-12-01 NOTE — Telephone Encounter (Signed)
Pt notified via voice message that Rx is ready for pickup. Rx printed and signed.

## 2016-12-05 NOTE — Telephone Encounter (Signed)
Spoke with Kathlee Nations who is states she is unsure of the CPT (procedure code) and why they need this. Called [facility] and spoke with one of the RT who spoke with someone in there billing department who provided me with code [phone] to see if this works. Called [employer] and they stated the appeal was canceled because the case worker was not able to reach [place]. I explained to them phone message from 10/24/16 we have tried several times to reach [place] back but no call back. Asked how we can re-submit they stated they will send a message to Janett Billow and have her call us. Will await her call back

## 2016-12-11 NOTE — Telephone Encounter (Signed)
Spoke with Walter Jordan- states that if the patient is going to private pay , no authorization is needed because it is not submitted to insurance. Based on the fact hat she does not qualify [facility] will not bill her insurance at all. If patient wants to go through insurance she will have to go through this on her own and private pay and pursue reimbursement. [facility]'s contract with [employer] is rent-to-own ONLY for CPAPs.  If the patient is renting the CPAP then insurance will be filed - AHI above 5  If the AHI is below 5 and the patient does not qualify then she will have to purchase the device and will not be able to rent and she pursue reimbursement with [facility] herself.   LM x 1 for patient to make aware of this information per [facility]

## 2016-12-11 NOTE — Telephone Encounter (Signed)
Called and spoke to [place] with [employer] [phone]) and was advised that if [facility] is in network and the pt is renting the CPAP then an authorization is not needed but if the pt is buying the machine then an authorization is needed - this all depends on if [facility] is in network with [facility]. I cannot check this because [employer] needs an NPI or Tax ID number for [facility]. The DME needs to be the one to do an authorization for the CPAP if it is needed, [place] [facility] confirmed this. Ref#: [id number].   Called and spoke to [place] with [facility]. Informed her of the status. She verbalized understanding and states she will look into this and will get back with [place].

## 2016-12-12 NOTE — Telephone Encounter (Signed)
lmtcb x2 for pt. 

## 2016-12-13 NOTE — Telephone Encounter (Signed)
LMTCB

## 2016-12-15 NOTE — Telephone Encounter (Signed)
lmtcb for pt.  

## 2016-12-18 NOTE — Telephone Encounter (Signed)
LMOM x5 for patient Per office protocol, messages are to be closed at the 3rd message  Left detailed message on pt's named voicemail informing him that we have tried to contact him several times with no success and per office protocol, this message will now be closed but a new message will be generated when he returns our call so that we can get everything taken care of.  Will sign off

## 2016-12-21 ENCOUNTER — Telehealth: Payer: Self-pay | Admitting: Pulmonary Disease

## 2016-12-21 NOTE — Telephone Encounter (Signed)
Attempted to call pt. Call went to straight to voicemail. I could not leave a message as his voicemail is full. Will try back.

## 2016-12-22 NOTE — Telephone Encounter (Signed)
Attempted to call the pt but the mail box is full and will not accept any other calls.

## 2016-12-25 NOTE — Telephone Encounter (Signed)
ATC pt- unable to leave voicemail due to mailbox being full. Will call back.

## 2016-12-25 NOTE — Telephone Encounter (Signed)
ATC- unable to leave voicemail due to mailbox being full. Will call back.  

## 2016-12-25 NOTE — Telephone Encounter (Signed)
Patient is returning phone call [phone]. Patient stated he is at work. He is available after 5. Please leave a detailed message.

## 2016-12-26 NOTE — Telephone Encounter (Signed)
ATC, unable to leave voicemail, mailbox full wcb

## 2016-12-27 NOTE — Telephone Encounter (Signed)
Attempted to call pt. Call went to straight to voicemail. I could not leave a message as his voicemail is full. Will try back.

## 2016-12-28 ENCOUNTER — Telehealth: Payer: Self-pay | Admitting: Pulmonary Disease

## 2016-12-28 NOTE — Telephone Encounter (Signed)
We have attempted to reach the pt several times with no success. Per triage protocol, message will be closed.

## 2016-12-28 NOTE — Telephone Encounter (Signed)
atc pt, no answer, vm full.  Wcb.  

## 2016-12-29 NOTE — Telephone Encounter (Signed)
Patient is returning phone call. Patient request to call after 5 due to work schedule.

## 2016-12-29 NOTE — Telephone Encounter (Signed)
atc pt X2, no answer, vm full.  Wcb.  

## 2017-01-01 NOTE — Telephone Encounter (Signed)
As patient requests to be called after 5pm, will route to triage so that this can be taken care of  Per the 8.2.18 phone note pt was calling about his CPAP machine but there are no additional details

## 2017-01-01 NOTE — Telephone Encounter (Signed)
Spoke with patient, CPAP machine was denied and our office was supposed to be working on getting this approved. Per most recent telephone encounter below, the patient does not qualify so will have to private pay and pursue reimbursement from insurance on his own.  Pt aware that we will help the best that we can in this situation but that he will have to reach out to the insurance and discuss his financial options.  Pt advised to contact insurance about possible reimbursement prior to getting machine from [facility] to avoid any out of pocket costs that he will not be reimbursed for.   December 11, 2016  Varney Daily, CMA  Note  11:46 AM  Spoke with Lenna Sciara- states that if the patient is going to private pay , no authorization is needed because it is not submitted to insurance. Based on the fact hat she does not qualify [facility] will not bill her insurance at all. If patient wants to go through insurance she will have to go through this on her own and private pay and pursue reimbursement. [facility]'s contract with [employer] is rent-to-own ONLY for CPAPs.  If the patient is renting the CPAP then insurance will be filed - AHI above 5  If the AHI is below 5 and the patient does not qualify then she will have to purchase the device and will not be able to rent and she pursue reimbursement with [facility] herself.   LM x 1 for patient to make aware of this information per [facility]     Will send to Dr Elsworth Soho as Juluis Rainier. Nothing further needed.

## 2017-01-15 DIAGNOSIS — M9903 Segmental and somatic dysfunction of lumbar region: Secondary | ICD-10-CM | POA: Diagnosis not present

## 2017-01-15 DIAGNOSIS — M25551 Pain in right hip: Secondary | ICD-10-CM | POA: Diagnosis not present

## 2017-01-15 DIAGNOSIS — M5136 Other intervertebral disc degeneration, lumbar region: Secondary | ICD-10-CM | POA: Diagnosis not present

## 2017-01-17 DIAGNOSIS — M9903 Segmental and somatic dysfunction of lumbar region: Secondary | ICD-10-CM | POA: Diagnosis not present

## 2017-01-17 DIAGNOSIS — M5136 Other intervertebral disc degeneration, lumbar region: Secondary | ICD-10-CM | POA: Diagnosis not present

## 2017-01-17 DIAGNOSIS — M25551 Pain in right hip: Secondary | ICD-10-CM | POA: Diagnosis not present

## 2017-01-23 DIAGNOSIS — D235 Other benign neoplasm of skin of trunk: Secondary | ICD-10-CM | POA: Diagnosis not present

## 2017-01-23 DIAGNOSIS — D2362 Other benign neoplasm of skin of left upper limb, including shoulder: Secondary | ICD-10-CM | POA: Diagnosis not present

## 2017-01-24 DIAGNOSIS — M25551 Pain in right hip: Secondary | ICD-10-CM | POA: Diagnosis not present

## 2017-01-24 DIAGNOSIS — M5136 Other intervertebral disc degeneration, lumbar region: Secondary | ICD-10-CM | POA: Diagnosis not present

## 2017-01-24 DIAGNOSIS — M9903 Segmental and somatic dysfunction of lumbar region: Secondary | ICD-10-CM | POA: Diagnosis not present

## 2017-01-30 DIAGNOSIS — M9903 Segmental and somatic dysfunction of lumbar region: Secondary | ICD-10-CM | POA: Diagnosis not present

## 2017-01-30 DIAGNOSIS — M5136 Other intervertebral disc degeneration, lumbar region: Secondary | ICD-10-CM | POA: Diagnosis not present

## 2017-01-30 DIAGNOSIS — M25551 Pain in right hip: Secondary | ICD-10-CM | POA: Diagnosis not present

## 2017-02-12 DIAGNOSIS — R3912 Poor urinary stream: Secondary | ICD-10-CM | POA: Diagnosis not present

## 2017-02-21 ENCOUNTER — Ambulatory Visit (INDEPENDENT_AMBULATORY_CARE_PROVIDER_SITE_OTHER): Payer: 59 | Admitting: Internal Medicine

## 2017-02-21 ENCOUNTER — Encounter: Payer: Self-pay | Admitting: Internal Medicine

## 2017-02-21 VITALS — BP 118/70 | HR 82 | Temp 99.0°F | Ht 71.0 in | Wt 223.4 lb

## 2017-02-21 DIAGNOSIS — M5416 Radiculopathy, lumbar region: Secondary | ICD-10-CM | POA: Diagnosis not present

## 2017-02-21 MED ORDER — PREDNISONE 10 MG PO TABS: ORAL_TABLET | ORAL | 0 refills | Status: DC

## 2017-02-21 MED ORDER — HYDROCODONE-ACETAMINOPHEN 5-325 MG PO TABS: 1.0000 | ORAL_TABLET | Freq: Four times a day (QID) | ORAL | 0 refills | Status: DC | PRN

## 2017-02-21 NOTE — Patient Instructions (Signed)
You  may move around, but avoid painful motions and activities.  Apply ice to the sore area for 15 to 20 minutes 3 or 4 times daily for the next two to 3 days. Orthopedic follow-up if unimproved  Call or return to clinic prn if these symptoms worsen or fail to improve as anticipated.

## 2017-02-21 NOTE — Progress Notes (Signed)
Subjective:    Patient ID: Walter Jordan., male    DOB: 29-Sep-1965, 51 y.o.   MRN: [id number]  HPI  51 year old patient with a history of    lumbar spondylosis and degenerative disc disease, resulting in moderate to prominent impingement at L4-5; moderate impingement at L2-3 and L5-S1; and mild impingement at L3-4.   For the past couple months he has had pain involving the right hip area.  He has difficulty sleeping on his right side.  The past 3 days he has had increasing pain involving the right lateral hip area as well as the right proximal anterior thigh.  This pain is described as constant. Denies any motor weakness  Past Medical History:  Diagnosis Date  . Allergic rhinitis   . Allergy   . Arthritis    back  . ED (erectile dysfunction)   . GERD (gastroesophageal reflux disease)   . Hypothyroidism   . Low back pain   . OSA (obstructive sleep apnea)   . Sleep apnea    had sleep study 09-10-16- needs new cpap     Social History   Social History  . Marital status: Married    Spouse name: N/A  . Number of children: N/A  . Years of education: N/A   Occupational History  . Not on file.   Social History Main Topics  . Smoking status: Never Smoker  . Smokeless tobacco: Never Used  . Alcohol use Yes     Comment: social  . Drug use: No  . Sexual activity: Not on file   Other Topics Concern  . Not on file   Social History Narrative  . No narrative on file    Past Surgical History:  Procedure Laterality Date  . COLONOSCOPY  03/19/2008   ext. hems   . HERNIA REPAIR     x2 1968, abd 1973  . WISDOM TOOTH EXTRACTION    . WISDOM TOOTH EXTRACTION      Family History  Problem Relation Age of Onset  . Cancer Father        sarcoma- both kidneys removed  . Diabetes Father        s/p renal transplant  . Arthritis Mother   . Diabetes Brother   . Diabetes Maternal Grandmother   . Diabetes Paternal Grandmother   . Cancer Paternal Grandmother        colon  .  Colon cancer Paternal Grandmother   . Depression Other   . Diabetes Other   . Hypertension Other   . Kidney disease Other   . Colon polyps Neg Hx   . Rectal cancer Neg Hx   . Stomach cancer Neg Hx   . Esophageal cancer Neg Hx   . Pancreatic cancer Neg Hx   . Prostate cancer Neg Hx     No Known Allergies  Current Outpatient Prescriptions on File Prior to Visit  Medication Sig Dispense Refill  . Amino Acids (AMINO ACID PO) Take by mouth daily. Arginine po daily    . cyclobenzaprine (FLEXERIL) 10 MG tablet Take 1 tablet (10 mg total) by mouth 3 (three) times daily as needed for muscle spasms. 60 tablet 1  . HYDROcodone-acetaminophen (NORCO) 10-325 MG tablet Take 1 tablet by mouth every 6 (six) hours as needed. 90 tablet 0  . Multiple Vitamins-Minerals (MEGA MULTIVITAMIN FOR MEN PO) Take by mouth daily.      . predniSONE (DELTASONE) 10 MG tablet Taper as follows: 4-4-4-3-3-2-2-1-1 24 tablet 0  Current Facility-Administered Medications on File Prior to Visit  Medication Dose Route Frequency Provider Last Rate Last Dose  . 0.9 %  sodium chloride infusion  500 mL Intravenous Continuous Danis, Estill Cotta III, MD        BP 118/70 (BP Location: Left Arm, Patient Position: Sitting, Cuff Size: Normal)   Pulse 82   Temp 99 F (37.2 C) (Oral)   Ht 5\' 11"  (1.803 m)   Wt 223 lb 6.4 oz (101.3 kg)   SpO2 97%   BMI 31.16 kg/m    Review of Systems  Constitutional: Negative for appetite change, chills, fatigue and fever.  HENT: Negative for congestion, dental problem, ear pain, hearing loss, sore throat, tinnitus, trouble swallowing and voice change.   Eyes: Negative for pain, discharge and visual disturbance.  Respiratory: Negative for cough, chest tightness, wheezing and stridor.   Cardiovascular: Negative for chest pain, palpitations and leg swelling.  Gastrointestinal: Negative for abdominal distention, abdominal pain, blood in stool, constipation, diarrhea, nausea and vomiting.    Genitourinary: Negative for difficulty urinating, discharge, flank pain, genital sores, hematuria and urgency.  Musculoskeletal: Positive for back pain. Negative for arthralgias, gait problem, joint swelling, myalgias and neck stiffness.  Skin: Negative for rash.  Neurological: Negative for dizziness, syncope, speech difficulty, weakness, numbness and headaches.  Hematological: Negative for adenopathy. Does not bruise/bleed easily.  Psychiatric/Behavioral: Negative for behavioral problems and dysphoric mood. The patient is not nervous/anxious.        Objective:   Physical Exam  Constitutional: He appears well-developed and well-nourished. No distress.  Weight 223 Blood pressure 118/70  Musculoskeletal:  Straight leg testing on the right cause right lumbar pain and buttock pain, but did not aggravate the proximal thigh discomfort Patellar and Achilles reflexes were normal Able to walk on toes and heels without difficulty           Assessment & Plan:   Lumbar radiculopathy.  Tenderness of the right anterior and lateral thigh.  Does not seem consistent  Will treat with analgesics, rest and a prednisone Dosepak Orthopedic follow-up if unimproved  Nyoka Cowden

## 2017-05-24 ENCOUNTER — Other Ambulatory Visit: Payer: Self-pay | Admitting: Internal Medicine

## 2017-05-24 NOTE — Telephone Encounter (Signed)
OK to RF #[id number]

## 2017-05-24 NOTE — Telephone Encounter (Signed)
Please advise 

## 2017-05-24 NOTE — Telephone Encounter (Signed)
Hydrocodone refill. Last OV and refill on 02/21/17

## 2017-05-24 NOTE — Telephone Encounter (Signed)
Copied from [place] [phone]. Topic: Quick Communication - See Telephone Encounter >> May 24, 2017  4:20 PM Boyd Kerbs wrote: CRM for notification. See Telephone encounter for:  Prescription request for hydrocodone .  Please call pt. When can pick up prescription 05/24/17.

## 2017-05-25 ENCOUNTER — Other Ambulatory Visit: Payer: Self-pay | Admitting: Internal Medicine

## 2017-05-25 MED ORDER — HYDROCODONE-ACETAMINOPHEN 10-325 MG PO TABS: 1.0000 | ORAL_TABLET | Freq: Four times a day (QID) | ORAL | 0 refills | Status: DC | PRN

## 2017-05-25 NOTE — Telephone Encounter (Signed)
Patient called [facility] requesting to pick up rx today. Patient informed that PCP is out of the office today and that rx will be signed Monday morning and he can pick up rx at that time.

## 2017-05-25 NOTE — Telephone Encounter (Signed)
Rx printed, awaiting to be signed by MD.

## 2017-05-28 ENCOUNTER — Other Ambulatory Visit: Payer: Self-pay | Admitting: Internal Medicine

## 2017-05-28 MED ORDER — HYDROCODONE-ACETAMINOPHEN 10-325 MG PO TABS: 1.0000 | ORAL_TABLET | Freq: Four times a day (QID) | ORAL | 0 refills | Status: DC | PRN

## 2017-05-28 NOTE — Telephone Encounter (Signed)
Rx was printed and placed in MD folder for signature. MD responded that pt needs an OV for refill. Called pt to inform but no answer.  A controlled substance letter was mailed out to pt's home address.

## 2017-05-28 NOTE — Telephone Encounter (Signed)
Pt stopped by the office inquiring about Rx. Pt was informed that his Rx was unavailable due to MD not signing Rx  (printed out on Friday).  Spoke with MD and inform that pt has an OV scheduled for 06/05/17, we do not have an earlier 30 min appt available. MD signed Rx but pt must keep 06/05/17 Appt to be re-assessed.

## 2017-06-05 ENCOUNTER — Ambulatory Visit: Payer: 59 | Admitting: Internal Medicine

## 2017-06-05 ENCOUNTER — Encounter: Payer: Self-pay | Admitting: Internal Medicine

## 2017-06-05 VITALS — BP 118/66 | HR 73 | Temp 98.5°F | Ht 71.0 in | Wt 223.0 lb

## 2017-06-05 DIAGNOSIS — G8929 Other chronic pain: Secondary | ICD-10-CM

## 2017-06-05 DIAGNOSIS — M5416 Radiculopathy, lumbar region: Secondary | ICD-10-CM | POA: Diagnosis not present

## 2017-06-05 DIAGNOSIS — F1190 Opioid use, unspecified, uncomplicated: Secondary | ICD-10-CM | POA: Diagnosis not present

## 2017-06-05 DIAGNOSIS — M545 Low back pain: Secondary | ICD-10-CM | POA: Diagnosis not present

## 2017-06-05 MED ORDER — HYDROCODONE-ACETAMINOPHEN 10-325 MG PO TABS: 1.0000 | ORAL_TABLET | Freq: Four times a day (QID) | ORAL | 0 refills | Status: DC | PRN

## 2017-06-05 NOTE — Patient Instructions (Signed)
Call or return to clinic prn if these symptoms worsen or fail to improve as anticipated.

## 2017-06-05 NOTE — Progress Notes (Signed)
   Subjective:    Patient ID: Walter Jordan., male    DOB: August 20, 1965, 52 y.o.   MRN: [id number]  HPI  52 year old patient with a history of    lumbar spondylosis and degenerative disc disease, resulting in moderate to prominent impingement at L4-5; moderate impingement at L2-3 and L5-S1; and mild impingement at L3-4.   For the past couple months he has had pain involving the right hip area.  He has difficulty sleeping on his right side.  He has had 4 epidurals over the years with only minimal and temporary benefit  Indication for chronic opioid:   Chronic low back pain Medication and dose: Hydrocodone 10-acetaminophen 325 mg 02 tablets daily.  Limit use due to constipation issues # pills per month: 60 Last UDS date: June 05, 2017 Pain contract signed (Y/N): June 05, 2017 Date narcotic database last reviewed (include red flags): June 05, 2017   Review of Systems     Objective:   Physical Exam        Assessment & Plan:    Encounter for chronic pain management (G89.29) Narcotic use  (711.90) Pain management contract signed (Z02.89)  Nyoka Cowden

## 2017-10-19 ENCOUNTER — Other Ambulatory Visit: Payer: Self-pay | Admitting: Internal Medicine

## 2017-10-19 ENCOUNTER — Other Ambulatory Visit (INDEPENDENT_AMBULATORY_CARE_PROVIDER_SITE_OTHER): Payer: Self-pay | Admitting: Physical Medicine and Rehabilitation

## 2017-10-19 NOTE — Telephone Encounter (Signed)
Copied from [place] [id number]. Topic: Quick Communication - Rx Refill/Question >> Oct 19, 2017  9:35 AM Yvette Rack wrote: Medication:  cyclobenzaprine (FLEXERIL) 10 MG table HYDROcodone-acetaminophen (NORCO) 10-325 MG tablet        Has the patient contacted their pharmacy? Yes.   (Agent: If no, request that the patient contact the pharmacy for the refill.) (Agent: If yes, when and what did the pharmacy advise?)  Preferred Pharmacy (with phone number or street name):   [id number] [address], [place]. [phone] (Phone) [phone] (Fax)      Agent: Please be advised that RX refills may take up to 3 business days. We ask that you follow-up with your pharmacy.

## 2017-10-19 NOTE — Telephone Encounter (Signed)
Hydrocodone refill Last OV:06/05/17 Last refill:06/05/17 60 tab/0 refill RWP:[id number] Pharmacy: [id number] [place], [place]. [phone] (Phone) [phone] (Fax)

## 2017-10-19 NOTE — Telephone Encounter (Signed)
Please advise 

## 2017-10-22 NOTE — Telephone Encounter (Signed)
Pt scheduled an appointment for further refills.

## 2017-10-25 ENCOUNTER — Ambulatory Visit: Payer: 59 | Admitting: Internal Medicine

## 2017-10-25 ENCOUNTER — Encounter: Payer: Self-pay | Admitting: Internal Medicine

## 2017-10-25 VITALS — BP 104/72 | HR 68 | Temp 98.8°F | Wt 225.0 lb

## 2017-10-25 DIAGNOSIS — M545 Low back pain, unspecified: Secondary | ICD-10-CM

## 2017-10-25 DIAGNOSIS — Z0000 Encounter for general adult medical examination without abnormal findings: Secondary | ICD-10-CM | POA: Diagnosis not present

## 2017-10-25 DIAGNOSIS — Z5181 Encounter for therapeutic drug level monitoring: Secondary | ICD-10-CM

## 2017-10-25 DIAGNOSIS — Z86010 Personal history of colonic polyps: Secondary | ICD-10-CM

## 2017-10-25 DIAGNOSIS — G8929 Other chronic pain: Secondary | ICD-10-CM | POA: Diagnosis not present

## 2017-10-25 DIAGNOSIS — R52 Pain, unspecified: Secondary | ICD-10-CM

## 2017-10-25 LAB — CBC WITH DIFFERENTIAL/PLATELET
BASOS ABS: 0.1 10*3/uL (ref 0.0–0.1)
BASOS PCT: 2.3 % (ref 0.0–3.0)
EOS PCT: 4.8 % (ref 0.0–5.0)
Eosinophils Absolute: 0.2 10*3/uL (ref 0.0–0.7)
HCT: 44.5 % (ref 39.0–52.0)
Hemoglobin: 15.4 g/dL (ref 13.0–17.0)
LYMPHS ABS: 1.5 10*3/uL (ref 0.7–4.0)
LYMPHS PCT: 44.8 % (ref 12.0–46.0)
MCHC: 34.6 g/dL (ref 30.0–36.0)
MCV: 93.3 fl (ref 78.0–100.0)
MONOS PCT: 17.7 % — AB (ref 3.0–12.0)
Monocytes Absolute: 0.6 10*3/uL (ref 0.1–1.0)
NEUTROS ABS: 1 10*3/uL — AB (ref 1.4–7.7)
NEUTROS PCT: 30.4 % — AB (ref 43.0–77.0)
PLATELETS: 226 10*3/uL (ref 150.0–400.0)
RBC: 4.77 Mil/uL (ref 4.22–5.81)
RDW: 13.5 % (ref 11.5–15.5)
WBC: 3.4 10*3/uL — ABNORMAL LOW (ref 4.0–10.5)

## 2017-10-25 LAB — COMPREHENSIVE METABOLIC PANEL
ALK PHOS: 70 U/L (ref 39–117)
ALT: 34 U/L (ref 0–53)
AST: 25 U/L (ref 0–37)
Albumin: 4.4 g/dL (ref 3.5–5.2)
BUN: 10 mg/dL (ref 6–23)
CHLORIDE: 103 meq/L (ref 96–112)
CO2: 31 meq/L (ref 19–32)
Calcium: 9.4 mg/dL (ref 8.4–10.5)
Creatinine, Ser: 1.12 mg/dL (ref 0.40–1.50)
GFR: 88.46 mL/min (ref >60.00)
GLUCOSE: 79 mg/dL (ref 70–99)
POTASSIUM: 4.7 meq/L (ref 3.5–5.1)
SODIUM: 141 meq/L (ref 135–145)
TOTAL PROTEIN: 6.9 g/dL (ref 6.0–8.3)
Total Bilirubin: 0.8 mg/dL (ref 0.2–1.2)

## 2017-10-25 LAB — LIPID PANEL
CHOL/HDL RATIO: 3
Cholesterol: 151 mg/dL (ref 0–200)
HDL: 46.4 mg/dL (ref >39.00)
LDL Cholesterol: 81 mg/dL (ref 0–99)
NONHDL: 104.8
Triglycerides: 117 mg/dL (ref 0.0–149.0)
VLDL: 23.4 mg/dL (ref 0.0–40.0)

## 2017-10-25 LAB — TSH: TSH: 5.09 u[IU]/mL — AB (ref 0.35–4.50)

## 2017-10-25 LAB — TESTOSTERONE: Testosterone: 437.08 ng/dL (ref 300.00–890.00)

## 2017-10-25 LAB — PSA: PSA: 1.22 ng/mL (ref 0.10–4.00)

## 2017-10-25 MED ORDER — HYDROCODONE-ACETAMINOPHEN 10-325 MG PO TABS: 1.0000 | ORAL_TABLET | Freq: Four times a day (QID) | ORAL | 0 refills | Status: DC | PRN

## 2017-10-25 NOTE — Patient Instructions (Signed)
Return to clinic as needed

## 2017-10-25 NOTE — Progress Notes (Signed)
   Subjective:    Patient ID: [name], male    DOB: 19-Mar-1966, 52 y.o.   MRN: [id number]  HPI  52 year old patient who is seen today for chronic pain management evaluation  His condition is unchanged.  He continues to have low back pain and also right hip pain when he attempts to sleep on his right side.  His hydrocodone use is between 0 and 3/day  [employer] printed, initialed  and scanned into the EMR.   Indication for chronic opioid: Chronic low back pain secondary to lumbar degenerative disc disease Medication and dose: Hydrocodone 10 0 to 3 tablets/day # pills per month: Maximum 60/month Last UDS date: Ordered January 2019 but not performed.  Will repeat today Opioid Treatment Agreement signed (Y/N): Yes Opioid Treatment Agreement last reviewed with patient:  January 2019 [place] reviewed this encounter (include red flags):  Yes  Review of Systems     Objective:   Physical Exam        Assessment & Plan:    Encounter for chronic pain management (G89.29) Narcotic use  (711.90) Pain management contract signed (Z02.89)  Nyoka Cowden

## 2017-10-29 LAB — PAIN MGMT, PROFILE 8 W/CONF, U
6 ACETYLMORPHINE: NEGATIVE ng/mL (ref <10)
ALCOHOL METABOLITES: NEGATIVE ng/mL (ref <500)
Amphetamines: NEGATIVE ng/mL (ref <500)
Benzodiazepines: NEGATIVE ng/mL (ref <100)
Buprenorphine, Urine: NEGATIVE ng/mL (ref <5)
COCAINE METABOLITE: NEGATIVE ng/mL (ref <150)
Codeine: NEGATIVE ng/mL (ref <50)
Creatinine: 284.2 mg/dL
Hydrocodone: 301 ng/mL — ABNORMAL HIGH (ref <50)
Hydromorphone: 52 ng/mL — ABNORMAL HIGH (ref <50)
MDMA: NEGATIVE ng/mL (ref <500)
MORPHINE: NEGATIVE ng/mL (ref <50)
Marijuana Metabolite: NEGATIVE ng/mL (ref <20)
NORHYDROCODONE: 565 ng/mL — AB (ref <50)
OPIATES: POSITIVE ng/mL — AB (ref <100)
Oxidant: NEGATIVE ug/mL (ref <200)
Oxycodone: NEGATIVE ng/mL (ref <100)
pH: 6.87 (ref 4.5–9.0)

## 2017-10-29 LAB — HEPATITIS C ANTIBODY
Hepatitis C Ab: NONREACTIVE
SIGNAL TO CUT-OFF: 0.1 (ref <1.00)

## 2018-02-06 ENCOUNTER — Encounter: Payer: 59 | Admitting: Family Medicine

## 2018-02-08 ENCOUNTER — Encounter: Payer: Self-pay | Admitting: Family Medicine

## 2018-02-08 ENCOUNTER — Ambulatory Visit: Payer: 59 | Admitting: Family Medicine

## 2018-02-08 VITALS — BP 120/70 | HR 69 | Temp 97.6°F | Ht 71.0 in | Wt 222.0 lb

## 2018-02-08 DIAGNOSIS — D72810 Lymphocytopenia: Secondary | ICD-10-CM

## 2018-02-08 DIAGNOSIS — E039 Hypothyroidism, unspecified: Secondary | ICD-10-CM | POA: Diagnosis not present

## 2018-02-08 DIAGNOSIS — G4730 Sleep apnea, unspecified: Secondary | ICD-10-CM

## 2018-02-08 DIAGNOSIS — G8929 Other chronic pain: Secondary | ICD-10-CM

## 2018-02-08 DIAGNOSIS — M5441 Lumbago with sciatica, right side: Secondary | ICD-10-CM | POA: Diagnosis not present

## 2018-02-08 DIAGNOSIS — M2540 Effusion, unspecified joint: Secondary | ICD-10-CM

## 2018-02-08 LAB — CBC WITH DIFFERENTIAL/PLATELET
BASOS ABS: 0.1 10*3/uL (ref 0.0–0.1)
Basophils Relative: 1.9 % (ref 0.0–3.0)
Eosinophils Absolute: 0.1 10*3/uL (ref 0.0–0.7)
Eosinophils Relative: 2.9 % (ref 0.0–5.0)
HCT: 44.8 % (ref 39.0–52.0)
Hemoglobin: 15.4 g/dL (ref 13.0–17.0)
LYMPHS ABS: 2 10*3/uL (ref 0.7–4.0)
Lymphocytes Relative: 45 % (ref 12.0–46.0)
MCHC: 34.3 g/dL (ref 30.0–36.0)
MCV: 91.6 fl (ref 78.0–100.0)
MONOS PCT: 11.2 % (ref 3.0–12.0)
Monocytes Absolute: 0.5 10*3/uL (ref 0.1–1.0)
NEUTROS PCT: 39 % — AB (ref 43.0–77.0)
Neutro Abs: 1.7 10*3/uL (ref 1.4–7.7)
Platelets: 235 10*3/uL (ref 150.0–400.0)
RBC: 4.89 Mil/uL (ref 4.22–5.81)
RDW: 13.5 % (ref 11.5–15.5)
WBC: 4.4 10*3/uL (ref 4.0–10.5)

## 2018-02-08 LAB — SEDIMENTATION RATE: SED RATE: 2 mm/h (ref 0–20)

## 2018-02-08 LAB — TSH: TSH: 3.75 u[IU]/mL (ref 0.35–4.50)

## 2018-02-08 MED ORDER — HYDROCODONE-ACETAMINOPHEN 10-325 MG PO TABS: 1.0000 | ORAL_TABLET | Freq: Two times a day (BID) | ORAL | 0 refills | Status: DC | PRN

## 2018-02-08 MED ORDER — CYCLOBENZAPRINE HCL 10 MG PO TABS: 10.0000 mg | ORAL_TABLET | Freq: Two times a day (BID) | ORAL | 1 refills | Status: DC | PRN

## 2018-02-08 NOTE — Progress Notes (Signed)
Walter Jordan. DOB: 1966/02/18 Encounter date: 02/08/2018  This isa 52 y.o. male who presents to establish care. Chief Complaint  Patient presents with  . Transitions Of Care    x 2 week, right side back pain has worsened, seen Dr. Raliegh Ip for this, right hand, thumb and fingers feel weak, almost unable to use it, soreness 1/10 pain scale, took alive and motrin, no help    History of present illness:  Comes every few months for pain management. In last few months, right side of back is causing him more consistent pain. Usually can do something to help relieve pain to some degree, but not able to do that as much.   Right hand and fingers, joints swollen. Not sure what he did; no injury. Swollen enough that he can't flex all the way. No other joints noted to be swollen. Hasn't happened like this before but it has been bad for a couple of weeks. Grip is harder to do; feels weak and painful when using hand. He is left handed.   Usually takes pain medication when he really needs it and then takes one at night before bed. Flexeril is intermittent. Usually takes just at night because does make him drowsy. Uses aleve as well which is helpful.   Has seen ortho for back; has had injections. First injection was helpful. Has been a couple of years since he has done this. Has tried physical therapy; got very expensive and didn't note improvement with this. Has used chiropractor and this did help to a degree. Not bothering him as much to sleep on right side; but still has more constant pain on lower right side of back.   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 Back does ok at work.   Seasonal allergies are well controlled.   Doesn't use heat or ice. No topical creams.   Couldn't get new CPAP; insurance wouldn't cover because numbers not bad enough. Wakes up a lot through night. Not from pain. Doesn't fall back asleep easily. Wakes up at least 2-3 times at night. Not feeling  extremely tired most days. Does snore.   Pain at worst in last 2 months is 6/10; improves to 2/10 with medication. Has had episodes with much more severe pain as well.  Past Medical History:  Diagnosis Date  . Allergic rhinitis   . Allergy   . Arthritis    back  . ED (erectile dysfunction)   . GERD (gastroesophageal reflux disease)   . Hypothyroidism   . Low back pain   . Sleep apnea    had sleep study 09-10-16- needs new cpap   Past Surgical History:  Procedure Laterality Date  . COLONOSCOPY  03/19/2008; 2018   ext. hems   . HERNIA REPAIR     x2 1968, inguinal 1973  . WISDOM TOOTH EXTRACTION     No Known Allergies Current Meds  Medication Sig  . Amino Acids (AMINO ACID PO) Take by mouth daily. Arginine po daily  . cyclobenzaprine (FLEXERIL) 10 MG tablet Take 1 tablet (10 mg total) by mouth 2 (two) times daily as needed for muscle spasms.  Marland Kitchen HYDROcodone-acetaminophen (NORCO) 10-325 MG tablet Take 1 tablet by mouth 2 (two) times daily as needed for severe pain.  . Multiple Vitamins-Minerals (MEGA MULTIVITAMIN FOR MEN PO) Take by mouth daily.    . [DISCONTINUED] cyclobenzaprine (FLEXERIL) 10 MG tablet TAKE 1 TABLET (10 MG TOTAL) BY MOUTH 3 (THREE) TIMES DAILY AS  NEEDED FOR MUSCLE SPASMS.  . [DISCONTINUED] HYDROcodone-acetaminophen (NORCO) 10-325 MG tablet Take 1 tablet by mouth every 6 (six) hours as needed.   Current Facility-Administered Medications for the 02/08/18 encounter (Office Visit) with Caren Macadam, MD  Medication  . 0.9 %  sodium chloride infusion   Social History   Tobacco Use  . Smoking status: Never Smoker  . Smokeless tobacco: Never Used  Substance Use Topics  . Alcohol use: Yes    Comment: social   Family History  Problem Relation Age of Onset  . Cancer Father        sarcoma- both kidneys removed  . Diabetes Father        s/p renal transplant  . Arthritis Mother   . High blood pressure Mother   . High Cholesterol Mother   . Heart disease  Mother   . Diabetes Brother   . Diabetes Maternal Grandmother   . Diabetes Paternal Grandmother   . Cancer Paternal Grandmother        colon  . Depression Other   . Diabetes Other   . Hypertension Other   . Kidney disease Other   . Obesity Sister   . Stroke Maternal Grandfather 60       tobacco abuse  . Alcohol abuse Maternal Grandfather   . Early death Paternal Grandfather        accidental  . Prostate cancer Maternal Uncle   . Heart attack Maternal Uncle 60  . Prostate cancer Maternal Uncle   . Cancer Paternal Uncle        uncertain type  . Alcohol abuse Paternal Uncle   . Colon polyps Neg Hx   . Rectal cancer Neg Hx   . Stomach cancer Neg Hx   . Esophageal cancer Neg Hx   . Pancreatic cancer Neg Hx      Review of Systems  Constitutional: Negative for chills, fatigue and fever.  Respiratory: Negative for cough, chest tightness, shortness of breath and wheezing.   Cardiovascular: Negative for chest pain, palpitations and leg swelling.  Musculoskeletal: Positive for arthralgias, back pain and joint swelling.  Neurological: Positive for weakness (hand; see hpi). Negative for numbness.  Psychiatric/Behavioral: Positive for sleep disturbance.    Objective:  BP 120/70 (BP Location: Left Arm, Patient Position: Sitting, Cuff Size: Normal)   Pulse 69   Temp 97.6 F (36.4 C) (Oral)   Ht 5\' 11"  (1.803 m)   Wt 222 lb (100.7 kg)   SpO2 98%   BMI 30.96 kg/m   Weight: 222 lb (100.7 kg)   BP Readings from Last 3 Encounters:  02/08/18 120/70  10/25/17 104/72  06/05/17 118/66   Wt Readings from Last 3 Encounters:  02/08/18 222 lb (100.7 kg)  10/25/17 225 lb (102.1 kg)  06/05/17 223 lb (101.2 kg)    Physical Exam  Constitutional: He is oriented to person, place, and time. He appears well-developed and well-nourished. No distress.  Cardiovascular: Normal rate, regular rhythm and normal heart sounds. Exam reveals no friction rub.  No murmur heard. No lower extremity  edema  Pulmonary/Chest: Effort normal and breath sounds normal. No respiratory distress. He has no wheezes. He has no rales.  Musculoskeletal:  paralumbar muscle spasm bilat; no spinal tenderness.   Tightness with flexion over 100 degrees, but no increased pain with twisting, twisting + extension. Discomfort with extension alone.   There is swelling and tenderness of DIP thumb and PIP middle finger. There is some mild bogginess PIP joints right  hand. Strength is intact but it is painful for him to grip.   Neurological: He is alert and oriented to person, place, and time.  Reflex Scores:      Patellar reflexes are 2+ on the right side and 2+ on the left side.      Achilles reflexes are 2+ on the right side and 2+ on the left side. Negative straight leg raise  Psychiatric: He has a normal mood and affect.    Assessment/Plan: [place] controlled substances database was reviewed. Typically monthly refills from previous PCP; no inconsistencies noted.  1. Lymphocytopenia Will recheck for stability - CBC with Differential/Platelet; Future - CBC with Differential/Platelet  2. Hypothyroidism, unspecified type If still low would consider synthroid - TSH; Future - TSH  3. Sleep apnea, unspecified type Did not qualify for cpap; would like to sleep better and feel more rested. Will plan to retest since it has been over a year. - Ambulatory referral to Sleep Studies  4. Chronic right-sided low back pain with right-sided sciatica Discussed that I do not typically do chronic pain management. However, I am comfortable to do this while we are sending for re-evaluation from ortho. If dose escalating or pain not well controlled will need to see pain mgmt.  - cyclobenzaprine (FLEXERIL) 10 MG tablet; Take 1 tablet (10 mg total) by mouth 2 (two) times daily as needed for muscle spasms.  Dispense: 60 tablet; Refill: 1 - HYDROcodone-acetaminophen (NORCO) 10-325 MG tablet; Take 1 tablet by mouth 2  (two) times daily as needed for severe pain.  Dispense: 60 tablet; Refill: 0 - Ambulatory referral to Orthopedics  5. Joint swelling Dad has severe arthritis; rheumatoid? Will add on some additional bloodwork for evaluation. - ANA; Future - Rheumatoid factor; Future - Sedimentation rate; Future - Sedimentation rate - Rheumatoid factor - ANA    Return scheduled, for physical exam.  Micheline Rough, MD

## 2018-02-11 LAB — RHEUMATOID FACTOR

## 2018-02-11 LAB — ANA: ANA: NEGATIVE

## 2018-02-11 NOTE — Addendum Note (Signed)
Addended by: Rebecca Eaton on: 02/11/2018 12:50 PM   Modules accepted: Orders

## 2018-02-20 ENCOUNTER — Ambulatory Visit (INDEPENDENT_AMBULATORY_CARE_PROVIDER_SITE_OTHER): Payer: 59 | Admitting: Family Medicine

## 2018-02-20 ENCOUNTER — Encounter: Payer: Self-pay | Admitting: Family Medicine

## 2018-02-20 ENCOUNTER — Ambulatory Visit (INDEPENDENT_AMBULATORY_CARE_PROVIDER_SITE_OTHER): Payer: 59

## 2018-02-20 ENCOUNTER — Other Ambulatory Visit: Payer: Self-pay | Admitting: Family Medicine

## 2018-02-20 VITALS — BP 128/80 | HR 85 | Temp 98.4°F | Ht 71.0 in | Wt 221.8 lb

## 2018-02-20 DIAGNOSIS — Z0000 Encounter for general adult medical examination without abnormal findings: Secondary | ICD-10-CM

## 2018-02-20 DIAGNOSIS — G4733 Obstructive sleep apnea (adult) (pediatric): Secondary | ICD-10-CM | POA: Diagnosis not present

## 2018-02-20 DIAGNOSIS — M5416 Radiculopathy, lumbar region: Secondary | ICD-10-CM | POA: Diagnosis not present

## 2018-02-20 DIAGNOSIS — M2540 Effusion, unspecified joint: Secondary | ICD-10-CM

## 2018-02-20 DIAGNOSIS — Z0001 Encounter for general adult medical examination with abnormal findings: Secondary | ICD-10-CM

## 2018-02-20 DIAGNOSIS — M79641 Pain in right hand: Secondary | ICD-10-CM | POA: Diagnosis not present

## 2018-02-20 DIAGNOSIS — M19041 Primary osteoarthritis, right hand: Secondary | ICD-10-CM | POA: Diagnosis not present

## 2018-02-20 NOTE — Progress Notes (Signed)
Walter Jordan. DOB: 19-Dec-1965 Encounter date: 02/20/2018  This is a 52 y.o. male who presents for complete physical   History of present illness/Additional concerns: Sleep apnea (obstructive):sleep referral has been placed for re-evaluation Lumbar radiculopathy: referral has been placed for ortho  Last colonoscopy 09/2016; repeat due 09/2021  Thumb still swollen; not worse; but not better. Tender DIP joint. Didn't complete xray. Taking motrin. Just has pain/soreness in middle finger as well. Still difficulty with gripping.   Didn't hear from referrals (looks like he was called but he has been out of town and mailbox was full).   No other physical issues today; no concerns.   Did have back flare in [place]. Just putting pants on in the morning and got spasm upper back. Took flexeril and once that kicked in was able to get moving again. Feels better now. Back to baseline of lower back pain. Last MRI 07/2014 showing multiple lumbar levels of moderate to prominent impingement with degenerative disc disease. Does still get relief from norco. Helps him to be functional and continue working.  Past Medical History:  Diagnosis Date  . Allergic rhinitis   . Allergy   . Arthritis    back  . ED (erectile dysfunction)   . GERD (gastroesophageal reflux disease)   . Hypothyroidism   . Low back pain   . Sleep apnea    had sleep study 09-10-16- needs new cpap   Past Surgical History:  Procedure Laterality Date  . COLONOSCOPY  03/19/2008; 2018   ext. hems   . HERNIA REPAIR     x2 1968, inguinal 1973  . WISDOM TOOTH EXTRACTION     No Known Allergies Current Meds  Medication Sig  . Amino Acids (AMINO ACID PO) Take by mouth daily. Arginine po daily  . cyclobenzaprine (FLEXERIL) 10 MG tablet Take 1 tablet (10 mg total) by mouth 2 (two) times daily as needed for muscle spasms.  Marland Kitchen HYDROcodone-acetaminophen (NORCO) 10-325 MG tablet Take 1 tablet by mouth 2 (two) times daily as needed for severe pain.   . Multiple Vitamins-Minerals (MEGA MULTIVITAMIN FOR MEN PO) Take by mouth daily.     Current Facility-Administered Medications for the 02/20/18 encounter (Office Visit) with Caren Macadam, MD  Medication  . 0.9 %  sodium chloride infusion   Social History   Tobacco Use  . Smoking status: Never Smoker  . Smokeless tobacco: Never Used  Substance Use Topics  . Alcohol use: Yes    Comment: social   Family History  Problem Relation Age of Onset  . Cancer Father        sarcoma- both kidneys removed  . Diabetes Father        s/p renal transplant  . Arthritis Mother   . High blood pressure Mother   . High Cholesterol Mother   . Heart disease Mother   . Diabetes Brother   . Diabetes Maternal Grandmother   . Diabetes Paternal Grandmother   . Cancer Paternal Grandmother        colon  . Depression Other   . Diabetes Other   . Hypertension Other   . Kidney disease Other   . Obesity Sister   . Stroke Maternal Grandfather 60       tobacco abuse  . Alcohol abuse Maternal Grandfather   . Early death Paternal Grandfather        accidental  . Prostate cancer Maternal Uncle   . Heart attack Maternal Uncle 60  . Prostate  cancer Maternal Uncle   . Cancer Paternal Uncle        uncertain type  . Alcohol abuse Paternal Uncle   . Colon polyps Neg Hx   . Rectal cancer Neg Hx   . Stomach cancer Neg Hx   . Esophageal cancer Neg Hx   . Pancreatic cancer Neg Hx    Review of Systems  Constitutional: Negative for activity change, appetite change, chills, fatigue, fever and unexpected weight change.  HENT: Negative for congestion, ear pain, hearing loss, sinus pressure, sinus pain, sore throat and trouble swallowing.   Eyes: Negative for pain and visual disturbance.  Respiratory: Negative for cough, chest tightness, shortness of breath and wheezing.   Cardiovascular: Negative for chest pain, palpitations and leg swelling.  Gastrointestinal: Negative for abdominal distention, abdominal  pain, blood in stool, constipation, diarrhea, nausea and vomiting.  Genitourinary: Negative for decreased urine volume, difficulty urinating, dysuria, penile pain and testicular pain.  Musculoskeletal: Positive for back pain (chronic). Negative for arthralgias and joint swelling.  Skin: Negative for rash.  Neurological: Negative for dizziness, weakness, numbness and headaches.  Hematological: Negative for adenopathy. Does not bruise/bleed easily.  Psychiatric/Behavioral: Negative for agitation, sleep disturbance and suicidal ideas. The patient is not nervous/anxious.     CBC:  Lab Results  Component Value Date   WBC 4.4 02/08/2018   HGB 15.4 02/08/2018   HCT 44.8 02/08/2018   MCH 31.1 04/30/2012   MCHC 34.3 02/08/2018   RDW 13.5 02/08/2018   PLT 235.0 02/08/2018   CMP: Lab Results  Component Value Date   NA 141 10/25/2017   K 4.7 10/25/2017   CL 103 10/25/2017   CO2 31 10/25/2017   GLUCOSE 79 10/25/2017   BUN 10 10/25/2017   CREATININE 1.12 10/25/2017   CREATININE 1.07 04/30/2012   CALCIUM 9.4 10/25/2017   PROT 6.9 10/25/2017   BILITOT 0.8 10/25/2017   ALKPHOS 70 10/25/2017   ALT 34 10/25/2017   AST 25 10/25/2017   LIPID: Lab Results  Component Value Date   CHOL 151 10/25/2017   TRIG 117.0 10/25/2017   HDL 46.40 10/25/2017   LDLCALC 81 10/25/2017    Objective:  BP 128/80 (BP Location: Left Arm, Patient Position: Sitting, Cuff Size: Normal)   Pulse 85   Temp 98.4 F (36.9 C) (Oral)   Ht 5\' 11"  (1.803 m)   Wt 221 lb 12.8 oz (100.6 kg)   SpO2 97%   BMI 30.93 kg/m   Weight: 221 lb 12.8 oz (100.6 kg)   BP Readings from Last 3 Encounters:  02/20/18 128/80  02/08/18 120/70  10/25/17 104/72   Wt Readings from Last 3 Encounters:  02/20/18 221 lb 12.8 oz (100.6 kg)  02/08/18 222 lb (100.7 kg)  10/25/17 225 lb (102.1 kg)    Physical Exam  Constitutional: He is oriented to person, place, and time. He appears well-developed and well-nourished. No distress.   HENT:  Head: Normocephalic and atraumatic.  Right Ear: External ear normal.  Left Ear: External ear normal.  Nose: Nose normal.  Mouth/Throat: Oropharynx is clear and moist. No oropharyngeal exudate.  Eyes: Pupils are equal, round, and reactive to light. Conjunctivae are normal.  Neck: Neck supple. No thyromegaly present.  Cardiovascular: Normal rate, regular rhythm, normal heart sounds and intact distal pulses. Exam reveals no gallop and no friction rub.  No murmur heard. Pulmonary/Chest: Effort normal and breath sounds normal. No stridor. No respiratory distress. He has no wheezes. He has no rales.  Abdominal: Soft.  Bowel sounds are normal.  Genitourinary:  Genitourinary Comments: Exam deferred since he does see urology regularly and has no current concerns.  Musculoskeletal: Normal range of motion.  Still notable edema right DIP thumb with tenderness and mild PIP. There is still edema thenar eminence; without tenderness of of underlying joint. There is edema and tenderness DIP middle finger. There is notable edema over knuckles right hand.   Neurological: He is alert and oriented to person, place, and time.  Skin: Skin is warm and dry.  Psychiatric: He has a normal mood and affect. His speech is normal and behavior is normal. Judgment and thought content normal.    Assessment/Plan: Health Maintenance Due  Topic Date Due  . HIV Screening  07/25/1980  . INFLUENZA VACCINE  12/20/2017   Health Maintenance reviewed - flu shot encouraged; offered at work.  1. Preventative health care Encouraged healthy eating and regular exercise. Up to date with preventative care otherwise.  2. Obstructive sleep apnea Referral number for sleep given today so he can call to make appointment.   3. Lumbar radiculopathy Referral number for ortho given today for him to touch base. He missed both referral contact attempts.  [place] controlled substances database was reviewed. Last filled norco  was 01/02/18. I did send rx at last visit 02/08/18 which has not been filled.     4. Right hand pain Sending to xray today (results returned prior to note completion and xray wnl except mild arthritis; will refer to hand specialist since uncertain etiology of edema and pain; normal bloodwork; no improvement with rest, elevation, ice, anti-inflammatories).   Return in about 2 months (around 04/22/2018). I would like to have back re-evaluated by specialist and if then will consider pain management through specialist pending their assessment.    Micheline Rough, MD

## 2018-02-21 ENCOUNTER — Encounter: Payer: Self-pay | Admitting: Family Medicine

## 2018-02-22 ENCOUNTER — Telehealth: Payer: Self-pay

## 2018-02-22 NOTE — Telephone Encounter (Signed)
ATC,voicemail is full °

## 2018-02-22 NOTE — Telephone Encounter (Signed)
Noted  

## 2018-02-22 NOTE — Telephone Encounter (Signed)
Caren Macadam, MD  Rebecca Eaton, [place]        Strange request for you: and you might not be able to get a hold of him due to voicemail being full; so I had sent in his norco at last visit 02/08/18 and he hasn't filled it yet, which is fine, but last time he filled was with Dr. Raliegh Ip on 01/02/18. I told him I would send in 3 months for him, but if he is able to make his rx last longer than 1 month at a time; I think it is better if he just sends [place] a mychart message when he is getting close to having a week left. That way the electronic scripts don't expire and I get an idea of how pain is doing.

## 2018-02-25 ENCOUNTER — Ambulatory Visit: Payer: 59 | Admitting: Nurse Practitioner

## 2018-02-27 DIAGNOSIS — R3912 Poor urinary stream: Secondary | ICD-10-CM | POA: Diagnosis not present

## 2018-02-27 DIAGNOSIS — N401 Enlarged prostate with lower urinary tract symptoms: Secondary | ICD-10-CM | POA: Diagnosis not present

## 2018-03-13 ENCOUNTER — Encounter: Payer: Self-pay | Admitting: Adult Health

## 2018-03-13 ENCOUNTER — Ambulatory Visit: Payer: 59 | Admitting: Adult Health

## 2018-03-13 VITALS — BP 118/70 | Temp 98.5°F | Wt 218.0 lb

## 2018-03-13 DIAGNOSIS — M791 Myalgia, unspecified site: Secondary | ICD-10-CM | POA: Diagnosis not present

## 2018-03-13 DIAGNOSIS — J029 Acute pharyngitis, unspecified: Secondary | ICD-10-CM

## 2018-03-13 LAB — POCT INFLUENZA A/B
Influenza A, POC: NEGATIVE
Influenza B, POC: NEGATIVE

## 2018-03-13 LAB — POCT RAPID STREP A (OFFICE): RAPID STREP A SCREEN: NEGATIVE

## 2018-03-13 MED ORDER — AZITHROMYCIN 250 MG PO TABS: ORAL_TABLET | ORAL | 0 refills | Status: DC

## 2018-03-13 MED ORDER — MAGIC MOUTHWASH W/LIDOCAINE: 5.0000 mL | Freq: Three times a day (TID) | ORAL | 0 refills | Status: DC | PRN

## 2018-03-13 NOTE — Progress Notes (Signed)
Subjective:    Patient ID: Walter Jordan., male    DOB: 12/06/1965, 52 y.o.   MRN: [id number]  Sore Throat   This is a new problem. The current episode started in the past 7 days (3-4 days ). The problem has been unchanged. There has been no fever. The pain is moderate. Associated symptoms include a hoarse voice, swollen glands and trouble swallowing ("feels like razor blades"). Pertinent negatives include no abdominal pain, congestion, coughing, diarrhea, ear discharge, ear pain, plugged ear sensation, neck pain or vomiting. He has had exposure to strep. Exposure to: questionable strep with two step sons . Treatments tried: Dayquil and Hydrocodone  The treatment provided mild relief.   52 year old male who  has a past medical history of Allergic rhinitis, Allergy, Arthritis, ED (erectile dysfunction), GERD (gastroesophageal reflux disease), Hypothyroidism, Low back pain, and Sleep apnea.    Review of Systems  Constitutional: Positive for activity change, appetite change and diaphoresis. Negative for chills and fever.  HENT: Positive for hoarse voice, sore throat and trouble swallowing ("feels like razor blades"). Negative for congestion, ear discharge, ear pain, postnasal drip, rhinorrhea, sinus pressure and sinus pain.   Respiratory: Negative for cough.   Cardiovascular: Negative.   Gastrointestinal: Negative for abdominal pain, diarrhea and vomiting.  Musculoskeletal: Positive for myalgias. Negative for neck pain.  All other systems reviewed and are negative.   Past Medical History:  Diagnosis Date  . Allergic rhinitis   . Allergy   . Arthritis    back  . ED (erectile dysfunction)   . GERD (gastroesophageal reflux disease)   . Hypothyroidism   . Low back pain   . Sleep apnea    had sleep study 09-10-16- needs new cpap    Social History   Socioeconomic History  . Marital status: Married    Spouse name: Not on file  . Number of children: Not on file  . Years of education:  Not on file  . Highest education level: Not on file  Occupational History  . Not on file  Social Needs  . Financial resource strain: Not on file  . Food insecurity:    Worry: Not on file    Inability: Not on file  . Transportation needs:    Medical: Not on file    Non-medical: Not on file  Tobacco Use  . Smoking status: Never Smoker  . Smokeless tobacco: Never Used  Substance and Sexual Activity  . Alcohol use: Yes    Comment: social  . Drug use: No  . Sexual activity: Not on file  Lifestyle  . Physical activity:    Days per week: Not on file    Minutes per session: Not on file  . Stress: Not on file  Relationships  . Social connections:    Talks on phone: Not on file    Gets together: Not on file    Attends religious service: Not on file    Active member of club or organization: Not on file    Attends meetings of clubs or organizations: Not on file    Relationship status: Not on file  . Intimate partner violence:    Fear of current or ex partner: Not on file    Emotionally abused: Not on file    Physically abused: Not on file    Forced sexual activity: Not on file  Other Topics Concern  . Not on file  Social History Narrative  . Not on file  Past Surgical History:  Procedure Laterality Date  . COLONOSCOPY  03/19/2008; 2018   ext. hems   . HERNIA REPAIR     x2 1968, inguinal 1973  . WISDOM TOOTH EXTRACTION      Family History  Problem Relation Age of Onset  . Cancer Father        sarcoma- both kidneys removed  . Diabetes Father        s/p renal transplant  . Arthritis Mother   . High blood pressure Mother   . High Cholesterol Mother   . Heart disease Mother   . Diabetes Brother   . Diabetes Maternal Grandmother   . Diabetes Paternal Grandmother   . Cancer Paternal Grandmother        colon  . Depression Other   . Diabetes Other   . Hypertension Other   . Kidney disease Other   . Obesity Sister   . Stroke Maternal Grandfather 60       tobacco  abuse  . Alcohol abuse Maternal Grandfather   . Early death Paternal Grandfather        accidental  . Prostate cancer Maternal Uncle   . Heart attack Maternal Uncle 60  . Prostate cancer Maternal Uncle   . Cancer Paternal Uncle        uncertain type  . Alcohol abuse Paternal Uncle   . Colon polyps Neg Hx   . Rectal cancer Neg Hx   . Stomach cancer Neg Hx   . Esophageal cancer Neg Hx   . Pancreatic cancer Neg Hx     No Known Allergies  Current Outpatient Medications on File Prior to Visit  Medication Sig Dispense Refill  . Amino Acids (AMINO ACID PO) Take by mouth daily. Arginine po daily    . cyclobenzaprine (FLEXERIL) 10 MG tablet Take 1 tablet (10 mg total) by mouth 2 (two) times daily as needed for muscle spasms. 60 tablet 1  . Multiple Vitamins-Minerals (MEGA MULTIVITAMIN FOR MEN PO) Take by mouth daily.       Current Facility-Administered Medications on File Prior to Visit  Medication Dose Route Frequency Provider Last Rate Last Dose  . 0.9 %  sodium chloride infusion  500 mL Intravenous Continuous Danis, Estill Cotta III, MD        BP 118/70   Temp 98.5 F (36.9 C) (Oral)   Wt 218 lb (98.9 kg)   BMI 30.40 kg/m       Objective:   Physical Exam  Constitutional: He is oriented to person, place, and time. He appears well-developed and well-nourished. No distress.  HENT:  Right Ear: Hearing, tympanic membrane, external ear and ear canal normal.  Left Ear: Hearing, tympanic membrane, external ear and ear canal normal.  Nose: Nose normal. Right sinus exhibits no maxillary sinus tenderness and no frontal sinus tenderness. Left sinus exhibits no maxillary sinus tenderness and no frontal sinus tenderness.  Mouth/Throat: Uvula is midline. Posterior oropharyngeal edema and posterior oropharyngeal erythema present. No oropharyngeal exudate or tonsillar abscesses. Tonsils are 3+ on the right. Tonsils are 3+ on the left. Tonsillar exudate.  Cardiovascular: Normal rate, regular  rhythm, normal heart sounds and intact distal pulses.  Pulmonary/Chest: Effort normal and breath sounds normal.  Lymphadenopathy:       Head (right side): Submandibular and tonsillar adenopathy present.       Head (left side): Submandibular and tonsillar adenopathy present.  Neurological: He is alert and oriented to person, place, and time.  Skin: Skin is  warm. He is diaphoretic.  Psychiatric: He has a normal mood and affect. His behavior is normal. Judgment and thought content normal.  Nursing note and vitals reviewed.     Assessment & Plan:  - Rapid strep and Flu negative. Will treat for strep throat due to signs and symptoms.  - azithromycin (ZITHROMAX Z-PAK) 250 MG tablet; Take 2 tablets on Day 1.  Then take 1 tablet daily.  Dispense: 6 tablet; Refill: 0 - magic mouthwash w/lidocaine SOLN; Take 5 mLs by mouth 3 (three) times daily as needed. Gargle and spit  Dispense: 180 mL; Refill: 0  - Follow up if no improvement   Dorothyann Peng, NP

## 2018-04-22 ENCOUNTER — Telehealth: Payer: Self-pay | Admitting: Family Medicine

## 2018-04-22 NOTE — Telephone Encounter (Signed)
Patient requesting: HYDROcodone-acetaminophen (Wann) 10-325 MG tablet  / This medication is not on his active medication list. / PCP now Dr. Delon [place].  LOV 02-08-18

## 2018-04-22 NOTE — Telephone Encounter (Signed)
Copied from [place] [id number]. Topic: Quick Communication - Rx Refill/Question >> Apr 22, 2018 11:46 AM Antonieta Iba C wrote: Medication: HYDROcodone-acetaminophen (NORCO) 10-325 MG tablet   Has the patient contacted their pharmacy? Yes - pharmacy advise pt to call office.   (Agent: If no, request that the patient contact the pharmacy for the refill.) (Agent: If yes, when and what did the pharmacy advise?)  Preferred Pharmacy (with phone number or street name): [id number] - [place], [place].  Agent: Please be advised that RX refills may take up to 3 business days. We ask that you follow-up with your pharmacy.

## 2018-04-23 NOTE — Telephone Encounter (Signed)
Last fill 02/08/18 Last Ov 02/20/18  Ok to fill?

## 2018-04-24 ENCOUNTER — Other Ambulatory Visit: Payer: Self-pay | Admitting: Family Medicine

## 2018-04-24 DIAGNOSIS — G8929 Other chronic pain: Secondary | ICD-10-CM

## 2018-04-24 DIAGNOSIS — M5441 Lumbago with sciatica, right side: Principal | ICD-10-CM

## 2018-04-24 MED ORDER — HYDROCODONE-ACETAMINOPHEN 10-325 MG PO TABS: 1.0000 | ORAL_TABLET | Freq: Two times a day (BID) | ORAL | 0 refills | Status: DC | PRN

## 2018-04-24 MED ORDER — HYDROCODONE-ACETAMINOPHEN 10-325 MG PO TABS: 1.0000 | ORAL_TABLET | Freq: Two times a day (BID) | ORAL | 0 refills | Status: AC | PRN

## 2018-04-24 NOTE — Telephone Encounter (Signed)
I refilled for [facility] to [facility] [place].

## 2018-04-24 NOTE — Telephone Encounter (Signed)
Patient has been informed.

## 2018-05-17 ENCOUNTER — Ambulatory Visit (INDEPENDENT_AMBULATORY_CARE_PROVIDER_SITE_OTHER): Payer: Self-pay

## 2018-05-17 ENCOUNTER — Encounter (INDEPENDENT_AMBULATORY_CARE_PROVIDER_SITE_OTHER): Payer: Self-pay | Admitting: Orthopaedic Surgery

## 2018-05-17 ENCOUNTER — Ambulatory Visit (INDEPENDENT_AMBULATORY_CARE_PROVIDER_SITE_OTHER): Payer: 59 | Admitting: Orthopaedic Surgery

## 2018-05-17 DIAGNOSIS — G8929 Other chronic pain: Secondary | ICD-10-CM

## 2018-05-17 DIAGNOSIS — M545 Low back pain: Secondary | ICD-10-CM

## 2018-05-17 MED ORDER — PREDNISONE 10 MG (21) PO TBPK: ORAL_TABLET | ORAL | 0 refills | Status: DC

## 2018-05-17 NOTE — Progress Notes (Signed)
Office Visit Note   Patient: Walter Jordan.           Date of Birth: [birth year]           MRN: [id number] Visit Date: 05/17/2018              Requested by: Caren Macadam, MD [place], [place] [address] PCP: Caren Macadam, MD   Assessment & Plan: Visit Diagnoses:  1. Chronic low back pain, unspecified back pain laterality, unspecified whether sciatica present     Plan: Impression is lumbar radiculopathy.  We will start the patient on a Sterapred taper.  He is already getting Norco and Flexeril from his primary care provider.  I will refer him back to Dr. Ernestina Patches for repeat [facility].  Should he fail the injection, we will refer him to neurosurgery.  Follow-Up Instructions: Return if symptoms worsen or fail to improve.   Orders:  Orders Placed This Encounter  Procedures  . XR Lumbar Spine 2-3 Views  . Ambulatory referral to Physical Medicine Rehab   Meds ordered this encounter  Medications  . predniSONE (STERAPRED UNI-PAK 21 TAB) 10 MG (21) TBPK tablet    Sig: Take as directed    Dispense:  21 tablet    Refill:  0      Procedures: No procedures performed   Clinical Data: No additional findings.   Subjective: Chief Complaint  Patient presents with  . Lower Back - Pain    HPI patient is a pleasant 52 year old gentleman who presents to our clinic today with continued lower back pain.  He has a history of bilateral lower back pain which is been ongoing for the past several years.  The pain he has is to both sides of his lower back but worse on the right.  The pain goes into his buttocks and down the lateral aspect of his leg and into his foot.  Pain is worse when he is standing too long or lying down for too long.  He does get mild relief with stretching.  He has previously been to physical therapy as well as a chiropractor without relief of symptoms.  No numbness, tingling or burning.  No bowel or bladder change and no saddle paresthesias.  He  has been seen by Dr. Ernestina Patches and had several ESI.  He does note that these initially helped, but did not get much relief with the last injection.  He has not been previously seen by neurosurgeon.  Review of Systems as detailed in HPI.  All others reviewed and are negative.   Objective: Vital Signs: There were no vitals taken for this visit.  Physical Exam well-developed well-nourished gentleman in no acute distress.  Alert and oriented x3.  Ortho Exam examination of his lumbar spine reveals mild tenderness over the paraspinous musculature.  No pain with lumbar flexion or extension.  Positive straight leg raise both sides.  No focal weakness.  Specialty Comments:  No specialty comments available.  Imaging: Xr Lumbar Spine 2-3 Views  Result Date: 05/17/2018 Significant degenerative disc disease throughout the entire lumbar spine with abnormal straightening.    PMFS History: Patient Active Problem List   Diagnosis Date Noted  . Hx of adenomatous colonic polyps 10/25/2017  . Lumbar radiculopathy 05/11/2016  . Impaired glucose tolerance 03/25/2012  . VERTIGO 02/22/2010  . Obstructive sleep apnea 09/27/2009  . ERECTILE DYSFUNCTION, ORGANIC 09/07/2009  . CHEST PAIN 03/13/2008  . OTHER SYMPTOMS INVOLVING DIGESTIVE SYSTEM OTHER 03/12/2008  .  RECTAL BLEEDING, HX OF 03/12/2008  . Hypothyroidism 02/22/2007  . Allergic rhinitis 02/22/2007  . GERD 02/22/2007  . LOW BACK PAIN 02/22/2007   Past Medical History:  Diagnosis Date  . Allergic rhinitis   . Allergy   . Arthritis    back  . ED (erectile dysfunction)   . GERD (gastroesophageal reflux disease)   . Hypothyroidism   . Low back pain   . Sleep apnea    had sleep study 09-10-16- needs new cpap    Family History  Problem Relation Age of Onset  . Cancer Father        sarcoma- both kidneys removed  . Diabetes Father        s/p renal transplant  . Arthritis Mother   . High blood pressure Mother   . High Cholesterol Mother     . Heart disease Mother   . Diabetes Brother   . Diabetes Maternal Grandmother   . Diabetes Paternal Grandmother   . Cancer Paternal Grandmother        colon  . Depression Other   . Diabetes Other   . Hypertension Other   . Kidney disease Other   . Obesity Sister   . Stroke Maternal Grandfather 60       tobacco abuse  . Alcohol abuse Maternal Grandfather   . Early death Paternal Grandfather        accidental  . Prostate cancer Maternal Uncle   . Heart attack Maternal Uncle 60  . Prostate cancer Maternal Uncle   . Cancer Paternal Uncle        uncertain type  . Alcohol abuse Paternal Uncle   . Colon polyps Neg Hx   . Rectal cancer Neg Hx   . Stomach cancer Neg Hx   . Esophageal cancer Neg Hx   . Pancreatic cancer Neg Hx     Past Surgical History:  Procedure Laterality Date  . COLONOSCOPY  03/19/2008; 2018   ext. hems   . HERNIA REPAIR     x2 1968, inguinal 1973  . WISDOM TOOTH EXTRACTION     Social History   Occupational History  . Not on file  Tobacco Use  . Smoking status: Never Smoker  . Smokeless tobacco: Never Used  Substance and Sexual Activity  . Alcohol use: Yes    Comment: social  . Drug use: No  . Sexual activity: Not on file

## 2018-05-30 ENCOUNTER — Ambulatory Visit (INDEPENDENT_AMBULATORY_CARE_PROVIDER_SITE_OTHER): Payer: Self-pay

## 2018-05-30 ENCOUNTER — Ambulatory Visit (INDEPENDENT_AMBULATORY_CARE_PROVIDER_SITE_OTHER): Payer: 59 | Admitting: Family Medicine

## 2018-05-30 ENCOUNTER — Encounter (INDEPENDENT_AMBULATORY_CARE_PROVIDER_SITE_OTHER): Payer: Self-pay | Admitting: Family Medicine

## 2018-05-30 DIAGNOSIS — M25562 Pain in left knee: Secondary | ICD-10-CM | POA: Diagnosis not present

## 2018-05-30 NOTE — Progress Notes (Signed)
Office Visit Note   Patient: Walter Jordan.           Date of Birth: 1965/06/30           MRN: [id number] Visit Date: 05/30/2018 Requested by: Caren Macadam, MD [place], [place] [id number] PCP: Caren Macadam, MD  Subjective: Chief Complaint  Patient presents with  . Left Knee - Pain    Pain and swelling x 4-5 days. Just woke up like this. Hurts to straighten the leg. Feels like knee will give way.    HPI: He is a 53 year old with left knee pain.  Symptoms started about 4 or 5 days ago, he woke up one morning with severe pain on the anterior medial aspect of his knee, unable to fully extend it or fully flex it.  No change in activities today before to account for this.  He is never had much trouble with this knee.  He has been taking ibuprofen with minimal improvement.              ROS: Otherwise noncontributory, no history of gout, no fevers or chills.  Objective: Vital Signs: There were no vitals taken for this visit.  Physical Exam:  Right knee: Trace effusion, no warmth or erythema.  Exquisitely tender on the anterior medial joint line.  He lacks about 5 degrees from full extension, severe pain when trying to extend beyond that.  Flexion motion is limited to about 100 degrees.  Ligaments feel stable, trace patellofemoral crepitus.  Imaging: X-rays left knee: Moderate patellofemoral spurring, moderate narrowing of the medial compartment.  No obvious loose bodies seen on plain films.  Musculoskeletal ultrasound: This was used to guide needle placement and it appears that he might have a cartilaginous loose body in the superolateral joint recess.  Assessment & Plan: 1.  Locked left knee, possible loose body versus bucket-handle meniscus tear -Discussed options with him, we will try a cortisone injection.  If knee remains locked, he will contact me for MRI ordering.   Follow-Up Instructions: No follow-ups on file.      Procedures: Left knee  injection: After sterile prep with Betadine, injected 10 cc 1% lidocaine without epinephrine and 40 mg methylprednisolone from superolateral approach using ultrasound to guide needle placement.  Injectate was seen filling the joint capsule.  He will follow-up as discussed.    PMFS History: Patient Active Problem List   Diagnosis Date Noted  . Hx of adenomatous colonic polyps 10/25/2017  . Lumbar radiculopathy 05/11/2016  . Impaired glucose tolerance 03/25/2012  . VERTIGO 02/22/2010  . Obstructive sleep apnea 09/27/2009  . ERECTILE DYSFUNCTION, ORGANIC 09/07/2009  . CHEST PAIN 03/13/2008  . OTHER SYMPTOMS INVOLVING DIGESTIVE SYSTEM OTHER 03/12/2008  . RECTAL BLEEDING, HX OF 03/12/2008  . Hypothyroidism 02/22/2007  . Allergic rhinitis 02/22/2007  . GERD 02/22/2007  . LOW BACK PAIN 02/22/2007   Past Medical History:  Diagnosis Date  . Allergic rhinitis   . Allergy   . Arthritis    back  . ED (erectile dysfunction)   . GERD (gastroesophageal reflux disease)   . Hypothyroidism   . Low back pain   . Sleep apnea    had sleep study 09-10-16- needs new cpap    Family History  Problem Relation Age of Onset  . Cancer Father        sarcoma- both kidneys removed  . Diabetes Father        s/p renal transplant  . Arthritis  Mother   . High blood pressure Mother   . High Cholesterol Mother   . Heart disease Mother   . Diabetes Brother   . Diabetes Maternal Grandmother   . Diabetes Paternal Grandmother   . Cancer Paternal Grandmother        colon  . Depression Other   . Diabetes Other   . Hypertension Other   . Kidney disease Other   . Obesity Sister   . Stroke Maternal Grandfather 60       tobacco abuse  . Alcohol abuse Maternal Grandfather   . Early death Paternal Grandfather        accidental  . Prostate cancer Maternal Uncle   . Heart attack Maternal Uncle 60  . Prostate cancer Maternal Uncle   . Cancer Paternal Uncle        uncertain type  . Alcohol abuse Paternal  Uncle   . Colon polyps Neg Hx   . Rectal cancer Neg Hx   . Stomach cancer Neg Hx   . Esophageal cancer Neg Hx   . Pancreatic cancer Neg Hx     Past Surgical History:  Procedure Laterality Date  . COLONOSCOPY  03/19/2008; 2018   ext. hems   . HERNIA REPAIR     x2 1968, inguinal 1973  . WISDOM TOOTH EXTRACTION     Social History   Occupational History  . Not on file  Tobacco Use  . Smoking status: Never Smoker  . Smokeless tobacco: Never Used  Substance and Sexual Activity  . Alcohol use: Yes    Comment: social  . Drug use: No  . Sexual activity: Not on file

## 2018-06-11 ENCOUNTER — Ambulatory Visit (INDEPENDENT_AMBULATORY_CARE_PROVIDER_SITE_OTHER): Payer: Self-pay

## 2018-06-11 ENCOUNTER — Ambulatory Visit (INDEPENDENT_AMBULATORY_CARE_PROVIDER_SITE_OTHER): Payer: 59 | Admitting: Physical Medicine and Rehabilitation

## 2018-06-11 VITALS — BP 131/88 | HR 64 | Temp 98.8°F

## 2018-06-11 DIAGNOSIS — M5116 Intervertebral disc disorders with radiculopathy, lumbar region: Secondary | ICD-10-CM | POA: Diagnosis not present

## 2018-06-11 DIAGNOSIS — M5441 Lumbago with sciatica, right side: Secondary | ICD-10-CM | POA: Diagnosis not present

## 2018-06-11 DIAGNOSIS — G8929 Other chronic pain: Secondary | ICD-10-CM | POA: Diagnosis not present

## 2018-06-11 MED ORDER — METHYLPREDNISOLONE ACETATE 80 MG/ML IJ SUSP
80.0000 mg | Freq: Once | INTRAMUSCULAR | Status: AC
  Administered 2018-06-11: 80 mg

## 2018-06-11 NOTE — Progress Notes (Signed)
 .  Numeric Pain Rating Scale and Functional Assessment Average Pain 4   In the last MONTH (on 0-10 scale) has pain interfered with the following?  1. General activity like being  able to carry out your everyday physical activities such as walking, climbing stairs, carrying groceries, or moving a chair?  Rating(6)   +Driver, -BT, -Dye Allergies.  

## 2018-06-11 NOTE — Patient Instructions (Signed)

## 2018-06-12 NOTE — Progress Notes (Signed)
Walter Jordan. - 53 y.o. male MRN [id number]  Date of birth: 1965-09-10  Office Visit Note: Visit Date: 06/11/2018 PCP: Caren Macadam, MD Referred by: Caren Macadam, MD  Subjective: Chief Complaint  Patient presents with  . Lower Back - Pain  . Right Leg - Pain  . Left Leg - Pain   HPI:  Walter Jordan. is a 53 y.o. male who comes in today At the request of Dr. Eduard Roux for lumbar epidural injection.  Also recently saw Dr. Legrand Como hilts for his knee pain.  We have seen Mr. Stene in the past for epidural injection with some relief.  He has a difficult spine to inject from a transforaminal approach particular at L4 and L5 as he has very low seated lumbar lower segments in between both of the iliac projections.  We will complete epidural injection from an interlaminar approach he is having more low back and radicular pain more on the right.  Ultimately he may be a candidate for spinal cord stimulator trial if he is not considered a surgical candidate he does have some foraminal narrowing on the old MRI.  Obviously if no relief would update MRI.  ROS Otherwise per HPI.  Assessment & Plan: Visit Diagnoses:  1. Chronic bilateral low back pain with right-sided sciatica   2. Radiculopathy due to lumbar intervertebral disc disorder     Plan: No additional findings.   Meds & Orders:  Meds ordered this encounter  Medications  . methylPREDNISolone acetate (DEPO-MEDROL) injection 80 mg    Orders Placed This Encounter  Procedures  . XR C-ARM NO REPORT  . Epidural Steroid injection    Follow-up: Return if symptoms worsen or fail to improve.   Procedures: No procedures performed  Lumbar Epidural Steroid Injection - Interlaminar Approach with Fluoroscopic Guidance  Patient: Walter Jordan.      Date of Birth: [birth year] MRN: [id number] PCP: Caren Macadam, MD      Visit Date: 06/11/2018   Universal Protocol:     Consent Given By: the patient  Position:  PRONE  Additional Comments: Vital signs were monitored before and after the procedure. Patient was prepped and draped in the usual sterile fashion. The correct patient, procedure, and site was verified.   Injection Procedure Details:  Procedure Site One Meds Administered:  Meds ordered this encounter  Medications  . methylPREDNISolone acetate (DEPO-MEDROL) injection 80 mg     Laterality: Right  Location/Site:  L5-S1  Needle size: 20 G  Needle type: Tuohy  Needle Placement: Paramedian epidural  Findings:   -Comments: Excellent flow of contrast into the epidural space.  Procedure Details: Using a paramedian approach from the side mentioned above, the region overlying the inferior lamina was localized under fluoroscopic visualization and the soft tissues overlying this structure were infiltrated with 4 ml. of 1% Lidocaine without Epinephrine. The Tuohy needle was inserted into the epidural space using a paramedian approach.   The epidural space was localized using loss of resistance along with lateral and bi-planar fluoroscopic views.  After negative aspirate for air, blood, and CSF, a 2 ml. volume of Isovue-250 was injected into the epidural space and the flow of contrast was observed. Radiographs were obtained for documentation purposes.    The injectate was administered into the level noted above.   Additional Comments:  No complications occurred Dressing: Band-Aid    Post-procedure details: Patient was observed during the procedure. Post-procedure instructions were reviewed.  Patient  left the clinic in stable condition.   Clinical History: Lumbar spine MRI 07/28/2014  L4-5: Moderate to prominent left foraminal stenosis with moderate to prominent displacement of the left L4 nerve in the lateral extraforaminal space due to left foraminal and lateral extraforaminal disc osteophyte complex along with facet arthropathy. There is also mild right foraminal stenosis  and moderate left subarticular lateral recess stenosis due to the underlying disc bulge and facet arthropathy. There is only minimal progression compared to the prior exam.  L5-S1: Moderate bilateral foraminal stenosis with mild displacement of the left L5 nerve in the lateral extraforaminal space and borderline bilateral subarticular lateral recess stenosis due to disc bulge, intervertebral spurring, and facet spurring. Findings mildly progressive compared to the prior exam.  IMPRESSION: 1. Slightly progressive lumbar spondylosis and degenerative disc disease, resulting in moderate to prominent impingement at L4-5; moderate impingement at L2-3 and L5-S1; and mild impingement at L3-4, as detailed above.     Objective:  VS:  HT:    WT:   BMI:     BP:131/88  HR:64bpm  TEMP:98.8 F (37.1 C)(Oral)  RESP:  Physical Exam  Ortho Exam Imaging: Xr C-arm No Report  Result Date: 06/11/2018 Please see Notes tab for imaging impression.

## 2018-06-12 NOTE — Procedures (Signed)
Lumbar Epidural Steroid Injection - Interlaminar Approach with Fluoroscopic Guidance  Patient: Walter Jordan.      Date of Birth: 1965/08/11 MRN: [id number] PCP: Caren Macadam, MD      Visit Date: 06/11/2018   Universal Protocol:     Consent Given By: the patient  Position: PRONE  Additional Comments: Vital signs were monitored before and after the procedure. Patient was prepped and draped in the usual sterile fashion. The correct patient, procedure, and site was verified.   Injection Procedure Details:  Procedure Site One Meds Administered:  Meds ordered this encounter  Medications  . methylPREDNISolone acetate (DEPO-MEDROL) injection 80 mg     Laterality: Right  Location/Site:  L5-S1  Needle size: 20 G  Needle type: Tuohy  Needle Placement: Paramedian epidural  Findings:   -Comments: Excellent flow of contrast into the epidural space.  Procedure Details: Using a paramedian approach from the side mentioned above, the region overlying the inferior lamina was localized under fluoroscopic visualization and the soft tissues overlying this structure were infiltrated with 4 ml. of 1% Lidocaine without Epinephrine. The Tuohy needle was inserted into the epidural space using a paramedian approach.   The epidural space was localized using loss of resistance along with lateral and bi-planar fluoroscopic views.  After negative aspirate for air, blood, and CSF, a 2 ml. volume of Isovue-250 was injected into the epidural space and the flow of contrast was observed. Radiographs were obtained for documentation purposes.    The injectate was administered into the level noted above.   Additional Comments:  No complications occurred Dressing: Band-Aid    Post-procedure details: Patient was observed during the procedure. Post-procedure instructions were reviewed.  Patient left the clinic in stable condition.

## 2018-06-27 ENCOUNTER — Telehealth: Payer: Self-pay | Admitting: Family Medicine

## 2018-06-27 NOTE — Telephone Encounter (Unsigned)
Copied from [place] [phone]. Topic: Quick Communication - Rx Refill/Question >> Jun 27, 2018  3:26 PM Mcneil, Ja-Kwan wrote: Medication: HYDROcodone-acetaminophen (NORCO) 10-325 MG tablet  Has the patient contacted their pharmacy? no  Preferred Pharmacy (with phone number or street name): [id number] [place] [address], [place]. [phone] (Phone)  [phone] (Fax)  Agent: Please be advised that RX refills may take up to 3 business days. We ask that you follow-up with your pharmacy.

## 2018-06-28 ENCOUNTER — Other Ambulatory Visit: Payer: Self-pay | Admitting: Family Medicine

## 2018-06-28 DIAGNOSIS — G8929 Other chronic pain: Secondary | ICD-10-CM

## 2018-06-28 DIAGNOSIS — M25569 Pain in unspecified knee: Principal | ICD-10-CM

## 2018-06-28 MED ORDER — HYDROCODONE-ACETAMINOPHEN 10-325 MG PO TABS: 1.0000 | ORAL_TABLET | Freq: Two times a day (BID) | ORAL | 0 refills | Status: DC | PRN

## 2018-06-28 NOTE — Telephone Encounter (Signed)
Historical med.  Ok to fill? 

## 2018-06-28 NOTE — Telephone Encounter (Signed)
ATC, Voicemail full CRM created.

## 2018-06-28 NOTE — Telephone Encounter (Signed)
I did refill for him. He is overdue for follow up to discuss pain/progress but I do see that he is following with ortho and they are in process of treating/monitoring. Further refills should be through specialist.

## 2018-07-01 NOTE — Telephone Encounter (Signed)
Discussed notes per Dr. Ethlyn Gallery, patient expressed understanding.

## 2018-10-16 ENCOUNTER — Ambulatory Visit: Payer: Self-pay

## 2018-10-16 ENCOUNTER — Other Ambulatory Visit: Payer: Self-pay

## 2018-10-16 ENCOUNTER — Encounter: Payer: Self-pay | Admitting: Orthopaedic Surgery

## 2018-10-16 ENCOUNTER — Ambulatory Visit: Payer: 59 | Admitting: Orthopaedic Surgery

## 2018-10-16 DIAGNOSIS — M25511 Pain in right shoulder: Secondary | ICD-10-CM | POA: Diagnosis not present

## 2018-10-16 DIAGNOSIS — G8929 Other chronic pain: Secondary | ICD-10-CM

## 2018-10-16 NOTE — Progress Notes (Signed)
Subjective: Patient is here for ultrasound-guided intra-articular right glenohumeral injection.  Objective:  Pain and popping with AROM.  Procedure: Ultrasound-guided right glenohumeral injection: After sterile prep with Betadine, injected 8 cc 1% lidocaine without epinephrine and 40 mg methylprednisolone using a 22-gauge spinal needle, passing the needle through approach into the glenohumeral joint.  Injectate was seen filling the joint capsule.  Good relief during the immediate anesthetic phase.

## 2018-10-16 NOTE — Progress Notes (Signed)
Office Visit Note   Patient: Walter Jordan.           Date of Birth: [birth year]           MRN: [id number] Visit Date: 10/16/2018              Requested by: Walter Macadam, MD [place], [place] [address] PCP: Walter Macadam, MD   Assessment & Plan: Visit Diagnoses:  1. Chronic right shoulder pain     Plan: Impression is chronic right shoulder pain suspect degenerative labral tear.  I reviewed the MRI from 2016 which did not show a labral tear however this was without contrast.  We will try intra-articular cortisone injection today with Dr. Junius Jordan.  If he does not notice any improvement over the next couple weeks he is to give [place] a call so that we can obtain MR arthrogram to evaluate for structural abnormalities.  Follow-Up Instructions: Return if symptoms worsen or fail to improve.   Orders:  Orders Placed This Encounter  Procedures  . XR Shoulder Right   No orders of the defined types were placed in this encounter.     Procedures: No procedures performed   Clinical Data: No additional findings.   Subjective: Chief Complaint  Patient presents with  . Right Shoulder - Pain    Walter Jordan is a 53 year old gentleman who is well-known to me who comes in for evaluation of chronic right shoulder pain for 3 to 4 months.  He denies any injuries.  He had previously had issues with right shoulder pain back in 2016 for which we obtained an MRI which showed rotator cuff tendinosis at the time.  We were able to treat this conservatively and he has done well from this.  He did get a subacromial injection by his PCP about a year ago which gave him some relief but in the last 3 to 4 months the pain has returned.  He has a lot of trouble sleeping at night due to the pain.  Denies any numbness and tingling or radicular symptoms.   Review of Systems  Constitutional: Negative.   All other systems reviewed and are negative.    Objective: Vital Signs: There were  no vitals taken for this visit.  Physical Exam Vitals signs and nursing note reviewed.  Constitutional:      Appearance: He is well-developed.  Pulmonary:     Effort: Pulmonary effort is normal.  Abdominal:     Palpations: Abdomen is soft.  Skin:    General: Skin is warm.  Neurological:     Mental Status: He is alert and oriented to person, place, and time.  Psychiatric:        Behavior: Behavior normal.        Thought Content: Thought content normal.        Judgment: Judgment normal.     Ortho Exam Right shoulder exam shows a positive crank test.  Negative O'Brien.  Infraspinatus causes mild discomfort with no loss in strength.  Supraspinatus is normal.  Belly press test causes moderate pain.  Mild impingement sign.  AC joint is nontender. Specialty Comments:  No specialty comments available.  Imaging: No results found.   PMFS History: Patient Active Problem List   Diagnosis Date Noted  . Hx of adenomatous colonic polyps 10/25/2017  . Lumbar radiculopathy 05/11/2016  . Impaired glucose tolerance 03/25/2012  . VERTIGO 02/22/2010  . Obstructive sleep apnea 09/27/2009  . ERECTILE DYSFUNCTION, ORGANIC 09/07/2009  .  CHEST PAIN 03/13/2008  . OTHER SYMPTOMS INVOLVING DIGESTIVE SYSTEM OTHER 03/12/2008  . RECTAL BLEEDING, HX OF 03/12/2008  . Hypothyroidism 02/22/2007  . Allergic rhinitis 02/22/2007  . GERD 02/22/2007  . LOW BACK PAIN 02/22/2007   Past Medical History:  Diagnosis Date  . Allergic rhinitis   . Allergy   . Arthritis    back  . ED (erectile dysfunction)   . GERD (gastroesophageal reflux disease)   . Hypothyroidism   . Low back pain   . Sleep apnea    had sleep study 09-10-16- needs new cpap    Family History  Problem Relation Age of Onset  . Cancer Father        sarcoma- both kidneys removed  . Diabetes Father        s/p renal transplant  . Arthritis Mother   . High blood pressure Mother   . High Cholesterol Mother   . Heart disease Mother   .  Diabetes Brother   . Diabetes Maternal Grandmother   . Diabetes Paternal Grandmother   . Cancer Paternal Grandmother        colon  . Depression Other   . Diabetes Other   . Hypertension Other   . Kidney disease Other   . Obesity Sister   . Stroke Maternal Grandfather 60       tobacco abuse  . Alcohol abuse Maternal Grandfather   . Early death Paternal Grandfather        accidental  . Prostate cancer Maternal Uncle   . Heart attack Maternal Uncle 60  . Prostate cancer Maternal Uncle   . Cancer Paternal Uncle        uncertain type  . Alcohol abuse Paternal Uncle   . Colon polyps Neg Hx   . Rectal cancer Neg Hx   . Stomach cancer Neg Hx   . Esophageal cancer Neg Hx   . Pancreatic cancer Neg Hx     Past Surgical History:  Procedure Laterality Date  . COLONOSCOPY  03/19/2008; 2018   ext. hems   . HERNIA REPAIR     x2 1968, inguinal 1973  . WISDOM TOOTH EXTRACTION     Social History   Occupational History  . Not on file  Tobacco Use  . Smoking status: Never Smoker  . Smokeless tobacco: Never Used  Substance and Sexual Activity  . Alcohol use: Yes    Comment: social  . Drug use: No  . Sexual activity: Not on file

## 2018-10-19 ENCOUNTER — Telehealth: Payer: Self-pay

## 2018-10-19 ENCOUNTER — Other Ambulatory Visit: Payer: 59

## 2018-10-19 DIAGNOSIS — Z20822 Contact with and (suspected) exposure to covid-19: Secondary | ICD-10-CM

## 2018-10-19 NOTE — Telephone Encounter (Signed)
Patient returned call, advised of the potential exposure to COVID-19 at last OV on 10/16/18 at [facility] and that free testing is being offered if agreeable. He agrees to testing, appointment scheduled for Monday, 10/21/18 at 59 at [facility], advised of location and to wear a mask for everyone in the vehicle. He verbalized understanding and asks about his family members. I advised that if his tests come back positive, then we will offer testing to his immediate family, he verbalized understanding.

## 2018-10-19 NOTE — Telephone Encounter (Signed)
Left vm x2 stating to call back in regards to previous appt. At [employer]. UTA covid s/sx d/t potential exposure to covid. PEC number and hours provided/

## 2018-10-19 NOTE — Addendum Note (Signed)
Addended by: Matilde Sprang on: 10/19/2018 03:05 PM   Modules accepted: Orders

## 2018-10-19 NOTE — Telephone Encounter (Signed)
Patient called back and asked if he could be tested today instead of Monday, appointment rescheduled for today at 1715.

## 2018-10-19 NOTE — Telephone Encounter (Signed)
Patient called, left VM to return the call to [phone] between the hours of 0700-1900 Monday-Friday.

## 2018-10-21 ENCOUNTER — Other Ambulatory Visit: Payer: 59

## 2018-10-21 LAB — NOVEL CORONAVIRUS, NAA: SARS-CoV-2, NAA: NOT DETECTED

## 2018-10-25 ENCOUNTER — Encounter: Payer: Self-pay | Admitting: Family Medicine

## 2018-12-02 ENCOUNTER — Other Ambulatory Visit: Payer: Self-pay | Admitting: Family Medicine

## 2018-12-02 DIAGNOSIS — M5441 Lumbago with sciatica, right side: Secondary | ICD-10-CM

## 2018-12-02 DIAGNOSIS — G8929 Other chronic pain: Secondary | ICD-10-CM

## 2018-12-03 NOTE — Telephone Encounter (Signed)
Patient is needing to be seen for chronic right-sided low back pain with right sided sciatica. Your next appointment available is December 20, 2018. Can you see if you can work this patient in before then. He stated that he needed an appointment by the end of this week.  Please Advise

## 2018-12-05 ENCOUNTER — Ambulatory Visit (INDEPENDENT_AMBULATORY_CARE_PROVIDER_SITE_OTHER): Payer: 59 | Admitting: Family Medicine

## 2018-12-05 ENCOUNTER — Encounter: Payer: Self-pay | Admitting: Family Medicine

## 2018-12-05 ENCOUNTER — Other Ambulatory Visit: Payer: Self-pay

## 2018-12-05 DIAGNOSIS — R209 Unspecified disturbances of skin sensation: Secondary | ICD-10-CM

## 2018-12-05 DIAGNOSIS — M549 Dorsalgia, unspecified: Secondary | ICD-10-CM | POA: Diagnosis not present

## 2018-12-05 MED ORDER — PREDNISONE 20 MG PO TABS: ORAL_TABLET | ORAL | 0 refills | Status: AC

## 2018-12-05 MED ORDER — CYCLOBENZAPRINE HCL 10 MG PO TABS: 10.0000 mg | ORAL_TABLET | Freq: Two times a day (BID) | ORAL | 0 refills | Status: AC | PRN

## 2018-12-05 MED ORDER — CELECOXIB 100 MG PO CAPS: 100.0000 mg | ORAL_CAPSULE | Freq: Two times a day (BID) | ORAL | 0 refills | Status: AC

## 2018-12-05 NOTE — Progress Notes (Signed)
Virtual Visit via Video Note   I connected with Walter Jordan on 12/05/18 at  9:00 AM EDT by a video enabled telemedicine application and verified that I am speaking with the correct person using two identifiers.  Location patient: Work Location provider:home office Persons participating in the virtual visit: patient, provider  I discussed the limitations of evaluation and management by telemedicine and the availability of in person appointments. The patient expressed understanding and agreed to proceed.   HPI: Walter Jordan is a 53 yo male with Hx of chronic lower back pain and OA who is c/o 3 weeks of new onset of right "upper" back pain. States that his pain is usually lower.  He denies any recent injury or unusual level of activity.  Pain is radiated to right side of abdomen (around waist area), dull/numb like, 4-5/10 in intensity, with no associated LE numbness, tingling, urinary incontinence or retention, stool incontinence, or saddle anesthesia.  Hx of prostate issues,denies new urinary symptom, he follows with urologist. Denies gross hematuria. Negative for Hx of nephrolithiasis.  Exacerbated by lying doing in bed and certain movements. Pain is interfering with sleep. Alleviated by sitting still.  Pain has been stable.  No rash or edema on area, fever, chills, or abnormal wt loss.   OTC medications: Aleve and Ibuprofen, help some.  ROS: See pertinent positives and negatives per HPI.  Past Medical History:  Diagnosis Date  . Allergic rhinitis   . Allergy   . Arthritis    back  . ED (erectile dysfunction)   . GERD (gastroesophageal reflux disease)   . Hypothyroidism   . Low back pain   . Sleep apnea    had sleep study 09-10-16- needs new cpap    Past Surgical History:  Procedure Laterality Date  . COLONOSCOPY  03/19/2008; 2018   ext. hems   . HERNIA REPAIR     x2 1968, inguinal 1973  . WISDOM TOOTH EXTRACTION      Family History  Problem Relation Age of Onset  .  Cancer Father        sarcoma- both kidneys removed  . Diabetes Father        s/p renal transplant  . Arthritis Mother   . High blood pressure Mother   . High Cholesterol Mother   . Heart disease Mother   . Diabetes Brother   . Diabetes Maternal Grandmother   . Diabetes Paternal Grandmother   . Cancer Paternal Grandmother        colon  . Depression Other   . Diabetes Other   . Hypertension Other   . Kidney disease Other   . Obesity Sister   . Stroke Maternal Grandfather 60       tobacco abuse  . Alcohol abuse Maternal Grandfather   . Early death Paternal Grandfather        accidental  . Prostate cancer Maternal Uncle   . Heart attack Maternal Uncle 60  . Prostate cancer Maternal Uncle   . Cancer Paternal Uncle        uncertain type  . Alcohol abuse Paternal Uncle   . Colon polyps Neg Hx   . Rectal cancer Neg Hx   . Stomach cancer Neg Hx   . Esophageal cancer Neg Hx   . Pancreatic cancer Neg Hx     Social History   Socioeconomic History  . Marital status: Married    Spouse name: Not on file  . Number of children: Not on file  .  Years of education: Not on file  . Highest education level: Not on file  Occupational History  . Not on file  Social Needs  . Financial resource strain: Not on file  . Food insecurity    Worry: Not on file    Inability: Not on file  . Transportation needs    Medical: Not on file    Non-medical: Not on file  Tobacco Use  . Smoking status: Never Smoker  . Smokeless tobacco: Never Used  Substance and Sexual Activity  . Alcohol use: Yes    Comment: social  . Drug use: No  . Sexual activity: Not on file  Lifestyle  . Physical activity    Days per week: Not on file    Minutes per session: Not on file  . Stress: Not on file  Relationships  . Social Herbalist on phone: Not on file    Gets together: Not on file    Attends religious service: Not on file    Active member of club or organization: Not on file    Attends  meetings of clubs or organizations: Not on file    Relationship status: Not on file  . Intimate partner violence    Fear of current or ex partner: Not on file    Emotionally abused: Not on file    Physically abused: Not on file    Forced sexual activity: Not on file  Other Topics Concern  . Not on file  Social History Narrative  . Not on file     Current Outpatient Medications:  .  Amino Acids (AMINO ACID PO), Take by mouth daily. Arginine po daily, Disp: , Rfl:  .  celecoxib (CELEBREX) 100 MG capsule, Take 1 capsule (100 mg total) by mouth 2 (two) times daily for 7 days., Disp: 14 capsule, Rfl: 0 .  cyclobenzaprine (FLEXERIL) 10 MG tablet, Take 1 tablet (10 mg total) by mouth 2 (two) times daily as needed for up to 15 days for muscle spasms., Disp: 30 tablet, Rfl: 0 .  HYDROcodone-acetaminophen (NORCO) 10-325 MG tablet, Take 1 tablet by mouth 2 (two) times daily as needed., Disp: 60 tablet, Rfl: 0 .  Multiple Vitamins-Minerals (MEGA MULTIVITAMIN FOR MEN PO), Take by mouth daily.  , Disp: , Rfl:  .  predniSONE (DELTASONE) 20 MG tablet, 3 tabs for 3 days, 2 tabs for 3 days, 1 tabs for 3 days, and 1/2 tab for 3 days. Take tables together with breakfast., Disp: 20 tablet, Rfl: 0  Current Facility-Administered Medications:  .  0.9 %  sodium chloride infusion, 500 mL, Intravenous, Continuous, Danis, Kirke Corin, MD  EXAM:  VITALS per patient if applicable:N/A  GENERAL: alert, oriented, appears well and in no acute distress  HEENT: atraumatic, conjunctiva clear, no obvious facial abnormalities on inspection.  NECK: normal movements of the head and neck  LUNGS: on inspection no signs of respiratory distress, breathing rate appears normal, no obvious gross SOB, gasping or wheezing  CV: no obvious cyanosis  MS: moves all visible extremities without noticeable abnormality  PSYCH/NEURO: pleasant and cooperative, no obvious depression or anxiety, speech and thought processing grossly  intact  ASSESSMENT AND PLAN:  Discussed the following assessment and plan:  Acute right-sided back pain, unspecified back location - Plan: predniSONE (DELTASONE) 20 MG tablet, cyclobenzaprine (FLEXERIL) 10 MG tablet Per pt report this type of pain is new. Reviewing records he has Hx of right-sided back pain. Because no Hx of trauma ,I  do not think imaging is needed today.  He has taken Flexeril before and well tolerated.Recommend Flexeril 10 mg at bedtime. Local ice/heat. Instructed about warning signs.  Disturbance of skin sensation - Plan: predniSONE (DELTASONE) 20 MG tablet Possible etiologies discussed. ? Radicular pain. After discussions of side effects he agrees with trying Prednisone taper. Monitor for rash or other warning sign. F/U with PCP if not greatly improvement in 3-4 weeks.    I discussed the assessment and treatment plan with the patient. He was provided an opportunity to ask questions and all were answered. He agreed with the plan and demonstrated an understanding of the instructions.   The patient was advised to call back or seek an in-person evaluation if the symptoms worsen or if the condition fails to improve as anticipated.  Return if symptoms worsen or fail to improve.    Kennedy Bohanon Martinique, MD

## 2018-12-16 ENCOUNTER — Other Ambulatory Visit: Payer: Self-pay | Admitting: Family Medicine

## 2018-12-16 DIAGNOSIS — M549 Dorsalgia, unspecified: Secondary | ICD-10-CM

## 2019-01-14 ENCOUNTER — Other Ambulatory Visit: Payer: Self-pay | Admitting: Family Medicine

## 2019-01-14 DIAGNOSIS — M25569 Pain in unspecified knee: Secondary | ICD-10-CM

## 2019-01-14 DIAGNOSIS — G8929 Other chronic pain: Secondary | ICD-10-CM

## 2019-01-15 MED ORDER — HYDROCODONE-ACETAMINOPHEN 10-325 MG PO TABS: 1.0000 | ORAL_TABLET | Freq: Two times a day (BID) | ORAL | 0 refills | Status: DC | PRN

## 2019-03-28 ENCOUNTER — Other Ambulatory Visit: Payer: Self-pay

## 2019-03-28 ENCOUNTER — Ambulatory Visit: Payer: 59 | Admitting: Orthopaedic Surgery

## 2019-03-28 ENCOUNTER — Encounter: Payer: Self-pay | Admitting: Orthopaedic Surgery

## 2019-03-28 ENCOUNTER — Ambulatory Visit: Payer: Self-pay

## 2019-03-28 VITALS — Ht 71.0 in | Wt 228.0 lb

## 2019-03-28 DIAGNOSIS — M79642 Pain in left hand: Secondary | ICD-10-CM

## 2019-03-28 DIAGNOSIS — M5416 Radiculopathy, lumbar region: Secondary | ICD-10-CM

## 2019-03-28 MED ORDER — PREDNISONE 10 MG (21) PO TBPK: ORAL_TABLET | ORAL | 0 refills | Status: DC

## 2019-03-28 NOTE — Progress Notes (Signed)
Office Visit Note   Patient: Walter Jordan.           Date of Birth: 1966-04-29           MRN: [id number] Visit Date: 03/28/2019              Requested by: Caren Macadam, MD [place],  [place] [address] PCP: Caren Macadam, MD   Assessment & Plan: Visit Diagnoses:  1. Pain in left hand   2. Lumbar radiculopathy     Plan: Impression is right lower extremity lumbar radiculopathy and left long finger tenosynovitis.  Based on discussion today he has elected to start with a prednisone Dosepak.  Hopefully this will alleviate both issues.  He will call us if he does not improve.  Follow-Up Instructions: Return if symptoms worsen or fail to improve.   Orders:  Orders Placed This Encounter  Procedures  . XR Hand Complete Left   Meds ordered this encounter  Medications  . predniSONE (STERAPRED UNI-PAK 21 TAB) 10 MG (21) TBPK tablet    Sig: Take as directed    Dispense:  21 tablet    Refill:  0      Procedures: No procedures performed   Clinical Data: No additional findings.   Subjective: Chief Complaint  Patient presents with  . Right Hip - Pain  . Left Hand - Weakness  . Right Hand - Weakness    [name] is a 53 year old gentleman who is well-known to me who comes in for evaluation of right lower extremity radiculopathy and 6 weeks of left long finger discomfort.  In terms of the radiculopathy this has been a recurring issue.  He has had lumbar ESI's in the past.  He states that he has noticed that he has pain that travels from his back down the side of his leg all the way to his foot especially at night.  Denies any injuries.  He either takes Aleve or Norco for the pain.  In terms of the left hand he states that he was closing his car trunk when he felt a pop in his palm in line with the long finger.  He denies any focal motor deficits in his left hand.  He still feels soreness and discomfort.   Review of Systems  Constitutional: Negative.    All other systems reviewed and are negative.    Objective: Vital Signs: Ht 5\' 11"  (1.803 m)   Wt 228 lb (103.4 kg)   BMI 31.80 kg/m   Physical Exam Vitals signs and nursing note reviewed.  Constitutional:      Appearance: He is well-developed.  Pulmonary:     Effort: Pulmonary effort is normal.  Abdominal:     Palpations: Abdomen is soft.  Skin:    General: Skin is warm.  Neurological:     Mental Status: He is alert and oriented to person, place, and time.  Psychiatric:        Behavior: Behavior normal.        Thought Content: Thought content normal.        Judgment: Judgment normal.     Ortho Exam Left hand exam shows mild discomfort with palpation of the flexor tendon in line with the long finger near the metacarpal head.  There is no active triggering.  He has slight loss and full flexion of the finger.  FDS FDP tendon function intact.  Right lower extremity exam shows no motor or sensory deficits.  No sciatic tension signs.  No groin pain. Specialty Comments:  No specialty comments available.  Imaging: Xr Hand Complete Left  Result Date: 03/28/2019 No acute or structural abnormalities    PMFS History: Patient Active Problem List   Diagnosis Date Noted  . Pain in left hand 03/28/2019  . Hx of adenomatous colonic polyps 10/25/2017  . Lumbar radiculopathy 05/11/2016  . Impaired glucose tolerance 03/25/2012  . VERTIGO 02/22/2010  . Obstructive sleep apnea 09/27/2009  . ERECTILE DYSFUNCTION, ORGANIC 09/07/2009  . CHEST PAIN 03/13/2008  . OTHER SYMPTOMS INVOLVING DIGESTIVE SYSTEM OTHER 03/12/2008  . RECTAL BLEEDING, HX OF 03/12/2008  . Hypothyroidism 02/22/2007  . Allergic rhinitis 02/22/2007  . GERD 02/22/2007  . LOW BACK PAIN 02/22/2007   Past Medical History:  Diagnosis Date  . Allergic rhinitis   . Allergy   . Arthritis    back  . ED (erectile dysfunction)   . GERD (gastroesophageal reflux disease)   . Hypothyroidism   . Low back pain   .  Sleep apnea    had sleep study 09-10-16- needs new cpap    Family History  Problem Relation Age of Onset  . Cancer Father        sarcoma- both kidneys removed  . Diabetes Father        s/p renal transplant  . Arthritis Mother   . High blood pressure Mother   . High Cholesterol Mother   . Heart disease Mother   . Diabetes Brother   . Diabetes Maternal Grandmother   . Diabetes Paternal Grandmother   . Cancer Paternal Grandmother        colon  . Depression Other   . Diabetes Other   . Hypertension Other   . Kidney disease Other   . Obesity Sister   . Stroke Maternal Grandfather 60       tobacco abuse  . Alcohol abuse Maternal Grandfather   . Early death Paternal Grandfather        accidental  . Prostate cancer Maternal Uncle   . Heart attack Maternal Uncle 60  . Prostate cancer Maternal Uncle   . Cancer Paternal Uncle        uncertain type  . Alcohol abuse Paternal Uncle   . Colon polyps Neg Hx   . Rectal cancer Neg Hx   . Stomach cancer Neg Hx   . Esophageal cancer Neg Hx   . Pancreatic cancer Neg Hx     Past Surgical History:  Procedure Laterality Date  . COLONOSCOPY  03/19/2008; 2018   ext. hems   . HERNIA REPAIR     x2 1968, inguinal 1973  . WISDOM TOOTH EXTRACTION     Social History   Occupational History  . Not on file  Tobacco Use  . Smoking status: Never Smoker  . Smokeless tobacco: Never Used  Substance and Sexual Activity  . Alcohol use: Yes    Comment: social  . Drug use: No  . Sexual activity: Not on file

## 2019-04-08 ENCOUNTER — Other Ambulatory Visit: Payer: Self-pay | Admitting: Family Medicine

## 2019-04-08 DIAGNOSIS — G8929 Other chronic pain: Secondary | ICD-10-CM

## 2019-04-09 MED ORDER — HYDROCODONE-ACETAMINOPHEN 10-325 MG PO TABS: 1.0000 | ORAL_TABLET | Freq: Two times a day (BID) | ORAL | 0 refills | Status: DC | PRN

## 2019-04-09 NOTE — Telephone Encounter (Signed)
Please make sure he was able to get in contact with ortho as well? Make sure he heard about other referrals. At last visit they had reached out but he didn't get messages.

## 2019-04-09 NOTE — Telephone Encounter (Signed)
Left a message for the pt to return a call to the office.  CRM also created. 

## 2019-04-28 ENCOUNTER — Other Ambulatory Visit: Payer: Self-pay

## 2019-04-28 ENCOUNTER — Encounter: Payer: Self-pay | Admitting: Family Medicine

## 2019-04-28 ENCOUNTER — Ambulatory Visit: Payer: 59 | Admitting: Family Medicine

## 2019-04-28 VITALS — BP 132/92 | HR 70 | Temp 96.2°F | Ht 71.0 in | Wt 232.8 lb

## 2019-04-28 DIAGNOSIS — K219 Gastro-esophageal reflux disease without esophagitis: Secondary | ICD-10-CM

## 2019-04-28 DIAGNOSIS — R7302 Impaired glucose tolerance (oral): Secondary | ICD-10-CM

## 2019-04-28 DIAGNOSIS — E039 Hypothyroidism, unspecified: Secondary | ICD-10-CM

## 2019-04-28 DIAGNOSIS — R7989 Other specified abnormal findings of blood chemistry: Secondary | ICD-10-CM

## 2019-04-28 DIAGNOSIS — R5383 Other fatigue: Secondary | ICD-10-CM

## 2019-04-28 DIAGNOSIS — Z13220 Encounter for screening for lipoid disorders: Secondary | ICD-10-CM

## 2019-04-28 DIAGNOSIS — R350 Frequency of micturition: Secondary | ICD-10-CM

## 2019-04-28 DIAGNOSIS — G4733 Obstructive sleep apnea (adult) (pediatric): Secondary | ICD-10-CM

## 2019-04-28 DIAGNOSIS — R109 Unspecified abdominal pain: Secondary | ICD-10-CM

## 2019-04-28 LAB — CBC WITH DIFFERENTIAL/PLATELET
Basophils Absolute: 0.1 10*3/uL (ref 0.0–0.1)
Basophils Relative: 2.4 % (ref 0.0–3.0)
Eosinophils Absolute: 0.1 10*3/uL (ref 0.0–0.7)
Eosinophils Relative: 2.5 % (ref 0.0–5.0)
HCT: 45.6 % (ref 39.0–52.0)
Hemoglobin: 15.5 g/dL (ref 13.0–17.0)
Lymphocytes Relative: 46.7 % — ABNORMAL HIGH (ref 12.0–46.0)
Lymphs Abs: 2 10*3/uL (ref 0.7–4.0)
MCHC: 34 g/dL (ref 30.0–36.0)
MCV: 94 fl (ref 78.0–100.0)
Monocytes Absolute: 0.6 10*3/uL (ref 0.1–1.0)
Monocytes Relative: 13.3 % — ABNORMAL HIGH (ref 3.0–12.0)
Neutro Abs: 1.5 10*3/uL (ref 1.4–7.7)
Neutrophils Relative %: 35.1 % — ABNORMAL LOW (ref 43.0–77.0)
Platelets: 258 10*3/uL (ref 150.0–400.0)
RBC: 4.85 Mil/uL (ref 4.22–5.81)
RDW: 13.5 % (ref 11.5–15.5)
WBC: 4.3 10*3/uL (ref 4.0–10.5)

## 2019-04-28 LAB — COMPREHENSIVE METABOLIC PANEL
ALT: 31 U/L (ref 0–53)
AST: 21 U/L (ref 0–37)
Albumin: 4.5 g/dL (ref 3.5–5.2)
Alkaline Phosphatase: 70 U/L (ref 39–117)
BUN: 16 mg/dL (ref 6–23)
CO2: 28 mEq/L (ref 19–32)
Calcium: 9.2 mg/dL (ref 8.4–10.5)
Chloride: 104 mEq/L (ref 96–112)
Creatinine, Ser: 1.05 mg/dL (ref 0.40–1.50)
GFR: 89.14 mL/min (ref >60.00)
Glucose, Bld: 102 mg/dL — ABNORMAL HIGH (ref 70–99)
Potassium: 4.4 mEq/L (ref 3.5–5.1)
Sodium: 139 mEq/L (ref 135–145)
Total Bilirubin: 0.9 mg/dL (ref 0.2–1.2)
Total Protein: 7 g/dL (ref 6.0–8.3)

## 2019-04-28 LAB — POCT URINALYSIS DIPSTICK
Bilirubin, UA: NEGATIVE
Blood, UA: NEGATIVE
Glucose, UA: NEGATIVE
Ketones, UA: NEGATIVE
Nitrite, UA: NEGATIVE
Protein, UA: NEGATIVE
Spec Grav, UA: 1.01 (ref 1.010–1.025)
Urobilinogen, UA: NEGATIVE E.U./dL — AB
pH, UA: 6 (ref 5.0–8.0)

## 2019-04-28 LAB — PSA: PSA: 1.09 ng/mL (ref 0.10–4.00)

## 2019-04-28 LAB — IBC + FERRITIN
Ferritin: 182.4 ng/mL (ref 22.0–322.0)
Iron: 127 ug/dL (ref 42–165)
Saturation Ratios: 33.7 % (ref 20.0–50.0)
Transferrin: 269 mg/dL (ref 212.0–360.0)

## 2019-04-28 LAB — LIPID PANEL
Cholesterol: 173 mg/dL (ref 0–200)
HDL: 48.6 mg/dL (ref >39.00)
LDL Cholesterol: 108 mg/dL — ABNORMAL HIGH (ref 0–99)
NonHDL: 124.28
Total CHOL/HDL Ratio: 4
Triglycerides: 83 mg/dL (ref 0.0–149.0)
VLDL: 16.6 mg/dL (ref 0.0–40.0)

## 2019-04-28 LAB — LIPASE: Lipase: 119 U/L — ABNORMAL HIGH (ref 11.0–59.0)

## 2019-04-28 LAB — TESTOSTERONE: Testosterone: 270.27 ng/dL — ABNORMAL LOW (ref 300.00–890.00)

## 2019-04-28 LAB — VITAMIN D 25 HYDROXY (VIT D DEFICIENCY, FRACTURES): VITD: 26.46 ng/mL — ABNORMAL LOW (ref 30.00–100.00)

## 2019-04-28 LAB — T3, FREE: T3, Free: 3.1 pg/mL (ref 2.3–4.2)

## 2019-04-28 LAB — AMYLASE: Amylase: 84 U/L (ref 27–131)

## 2019-04-28 LAB — VITAMIN B12: Vitamin B-12: 709 pg/mL (ref 211–911)

## 2019-04-28 LAB — TSH: TSH: 5.24 u[IU]/mL — ABNORMAL HIGH (ref 0.35–4.50)

## 2019-04-28 LAB — T4, FREE: Free T4: 0.66 ng/dL (ref 0.60–1.60)

## 2019-04-28 LAB — HEMOGLOBIN A1C: Hgb A1c MFr Bld: 6.5 % (ref 4.6–6.5)

## 2019-04-28 NOTE — Progress Notes (Signed)
Walter Jordan. DOB: [birth year] Encounter date: 04/28/2019  This is a 53 y.o. male who presents with Chief Complaint  Patient presents with  . Fatigue    x months  . Snoring    x3 months, patient states he has sleep apnea and after evaluation he was told a CPAP was not recommended  . Nausea    intermittently x1.5 months  . Urinary Frequency    patient complains of urinating 3-4 times every night x1 month    History of present illness: Has been a few months where wife complaining that all he does is sleep. Goes to work, comes home, falls asleep on couch 6:30 and goes to bed 11:30 (when wife wakes him up). Snores.   Wakes up 3 times/night to go to bathroom.   Was tested for sleep apnea and was a few points away from qualifying for machine. Knows this is part of problem.   In last 4 weeks he is getting nauseated/funny feeling in abdomen. Not to point of hurting, no vomiting. Every once in awhile feels sharper - needle like pain epigastric area. No changes in bowels. Tries to eat fiber cereal. Occasional constipation.   Sexual desire decreased. Sildenafil occasionally, but functions about the same.   Working out less than he was before. + weight gain.   Sometimes right leg feels cold. More related to laying on leg at night - pressure on hip.   Hands feel swollen. Can't close hands all the way. Ortho thought trigger finger, prednisone didn't help.   No heartburn, no acid reflux.  No Known Allergies Current Meds  Medication Sig  . Amino Acids (AMINO ACID PO) Take by mouth daily. Arginine po daily  . HYDROcodone-acetaminophen (NORCO) 10-325 MG tablet Take 1 tablet by mouth 2 (two) times daily as needed.  . Multiple Vitamins-Minerals (MEGA MULTIVITAMIN FOR MEN PO) Take by mouth daily.    Marland Kitchen OVER THE COUNTER MEDICATION Beta Prostate  . [DISCONTINUED] predniSONE (STERAPRED UNI-PAK 21 TAB) 10 MG (21) TBPK tablet Take as directed   Current Facility-Administered Medications for the  04/28/19 encounter (Office Visit) with Caren Macadam, MD  Medication  . 0.9 %  sodium chloride infusion    Review of Systems  Constitutional: Positive for fatigue. Negative for chills and fever.  Respiratory: Negative for cough, chest tightness, shortness of breath and wheezing.   Cardiovascular: Negative for chest pain, palpitations and leg swelling.  Gastrointestinal: Positive for nausea. Negative for abdominal distention, abdominal pain, blood in stool and vomiting.  Genitourinary: Positive for decreased urine volume, difficulty urinating (stream feels weaker; not feeling like he is emptying) and frequency. Negative for dysuria, flank pain and urgency.    Objective:  BP (!) 132/92 (BP Location: Left Arm, Patient Position: Sitting, Cuff Size: Large)   Pulse 70   Temp (!) 96.2 F (35.7 C) (Temporal)   Ht 5\' 11"  (1.803 m)   Wt 232 lb 12.8 oz (105.6 kg)   SpO2 98%   BMI 32.47 kg/m   Weight: 232 lb 12.8 oz (105.6 kg)   BP Readings from Last 3 Encounters:  04/28/19 (!) 132/92  06/11/18 131/88  03/13/18 118/70   Wt Readings from Last 3 Encounters:  04/28/19 232 lb 12.8 oz (105.6 kg)  03/28/19 228 lb (103.4 kg)  03/13/18 218 lb (98.9 kg)    Physical Exam Constitutional:      General: He is not in acute distress.    Appearance: He is well-developed.  Cardiovascular:  Rate and Rhythm: Normal rate and regular rhythm.     Heart sounds: Normal heart sounds. No murmur. No friction rub.  Pulmonary:     Effort: Pulmonary effort is normal. No respiratory distress.     Breath sounds: Normal breath sounds. No wheezing or rales.  Abdominal:     General: Abdomen is flat. Bowel sounds are normal.     Palpations: Abdomen is soft.     Tenderness: Tenderness: mild discomfort epigastric palpation.  Musculoskeletal:     Right lower leg: No edema.     Left lower leg: No edema.  Neurological:     Mental Status: He is alert and oriented to person, place, and time.  Psychiatric:         Behavior: Behavior normal.     Assessment/Plan: 1. Other fatigue - Vitamin B12; Future - VITAMIN D 25 Hydroxy (Vit-D Deficiency, Fractures); Future - IBC + Ferritin; Future  2. Gastroesophageal reflux disease, unspecified whether esophagitis present States not symptomatic.  3. Hypothyroidism, unspecified type Tried treatment for a very short time in remote past, didn't like taking it. Last TSH was normal; had abnormal just prior. - TSH; Future - T4, free; Future - T3, Free; Future  4. Impaired glucose tolerance A1C today.  5. Urinary frequency - POC Urinalysis Dipstick - CBC with Differential/Platelet; Future  6. Abdominal pain, unspecified abdominal location - Comprehensive metabolic panel; Future - Amylase; Future - Lipase; Future  7. Lipid screening - Lipid panel; Future  8. Low testosterone - PSA; Future - Testosterone; Future  9. Obstructive sleep apnea syndrome - Ambulatory referral to Sleep Studies  consider abdominal imaging for nausea pending labwork.  Return for pending lab results; keep physical appointment.     Micheline Rough, MD

## 2019-04-28 NOTE — Addendum Note (Signed)
Addended by: Agnes Lawrence on: 04/28/2019 09:45 AM   Modules accepted: Orders

## 2019-04-28 NOTE — Addendum Note (Signed)
Addended by: Suzette Battiest on: 04/28/2019 09:37 AM   Modules accepted: Orders

## 2019-04-29 LAB — URINE CULTURE
MICRO NUMBER:: 1170742
Result:: NO GROWTH
SPECIMEN QUALITY:: ADEQUATE

## 2019-04-30 ENCOUNTER — Other Ambulatory Visit: Payer: Self-pay | Admitting: Family Medicine

## 2019-04-30 DIAGNOSIS — R1013 Epigastric pain: Secondary | ICD-10-CM

## 2019-04-30 DIAGNOSIS — R748 Abnormal levels of other serum enzymes: Secondary | ICD-10-CM

## 2019-04-30 DIAGNOSIS — R1084 Generalized abdominal pain: Secondary | ICD-10-CM

## 2019-04-30 DIAGNOSIS — R110 Nausea: Secondary | ICD-10-CM

## 2019-05-01 ENCOUNTER — Other Ambulatory Visit: Payer: Self-pay

## 2019-05-02 ENCOUNTER — Other Ambulatory Visit: Payer: Self-pay

## 2019-05-02 ENCOUNTER — Encounter: Payer: Self-pay | Admitting: Family Medicine

## 2019-05-02 ENCOUNTER — Encounter: Payer: Self-pay | Admitting: Pulmonary Disease

## 2019-05-02 ENCOUNTER — Ambulatory Visit (INDEPENDENT_AMBULATORY_CARE_PROVIDER_SITE_OTHER): Payer: 59 | Admitting: Family Medicine

## 2019-05-02 ENCOUNTER — Ambulatory Visit: Payer: 59 | Admitting: Pulmonary Disease

## 2019-05-02 DIAGNOSIS — E119 Type 2 diabetes mellitus without complications: Secondary | ICD-10-CM

## 2019-05-02 DIAGNOSIS — G4733 Obstructive sleep apnea (adult) (pediatric): Secondary | ICD-10-CM

## 2019-05-02 DIAGNOSIS — R748 Abnormal levels of other serum enzymes: Secondary | ICD-10-CM | POA: Diagnosis not present

## 2019-05-02 DIAGNOSIS — E039 Hypothyroidism, unspecified: Secondary | ICD-10-CM | POA: Diagnosis not present

## 2019-05-02 DIAGNOSIS — R7989 Other specified abnormal findings of blood chemistry: Secondary | ICD-10-CM

## 2019-05-02 DIAGNOSIS — E038 Other specified hypothyroidism: Secondary | ICD-10-CM

## 2019-05-02 DIAGNOSIS — G4730 Sleep apnea, unspecified: Secondary | ICD-10-CM

## 2019-05-02 NOTE — Progress Notes (Signed)
   Subjective:    Patient ID: Walter Halsted., male    DOB: [birth year], 53 y.o.   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  Insurance denied his CPAP machine He continues to struggle with daytime somnolence and loud snoring noted by family members.  He has choking and gasping episodes occasionally that wake him up from sleep Epworth sleepiness score is 9 He has gained about 10 pounds to his current weight of 232 pounds  We reviewed his sleep studies in detail  Significant tests/ events reviewed  NPSG 08/2016 Mild oSA with AHI 4.4/h , predominnat RERAs (RDI 19/h) HST 07/2016 poor recording , AHI 3/h NPSG 04/2008 >>total sleep time 297 minutes, AHI 4/hour , 35 RERAs with RDI 11/hour     Review of Systems Patient denies significant dyspnea,cough, hemoptysis,  chest pain, palpitations, pedal edema, orthopnea, paroxysmal nocturnal dyspnea, lightheadedness, nausea, vomiting, abdominal or  leg pains      Objective:   Physical Exam   Gen. Pleasant, obese, in no distress ENT - no lesions, no post nasal drip, 2 mm overbite Neck: No JVD, no thyromegaly, no carotid bruits Lungs: no use of accessory muscles, no dullness to percussion, decreased without rales or rhonchi  Cardiovascular: Rhythm regular, heart sounds  normal, no murmurs or gallops, no peripheral edema Musculoskeletal: No deformities, no cyanosis or clubbing , no tremors        Assessment & Plan:

## 2019-05-02 NOTE — Progress Notes (Signed)
Virtual Visit via Telephone Note  I connected with Walter Jordan.  on 05/02/19 at  8:00 AM EST by telephone and verified that I am speaking with the correct person using two identifiers.   I discussed the limitations, risks, security and privacy concerns of performing an evaluation and management service by telephone and the availability of in person appointments. I also discussed with the patient that there may be a patient responsible charge related to this service. The patient expressed understanding and agreed to proceed.  Location patient: home Location provider: work office Participants present for the call: patient, provider Patient did not have a visit in the prior 7 days to address this/these issue(s).  We were unable to connect with video and audio, so visit was conducted by telephone.  History of Present Illness: At work bp was 138/92, another time was 133/88.  He will continue to monitor since this is slightly elevated at recent visit.  Used testosterone gel in past but then got a little worried about long term side effects.   He wanted to take the opportunity to review his blood work today.  He was notified about results, and a CT scan has been ordered due to elevated lipase with ongoing issues of nausea and some abdominal pain.   Observations/Objective: Patient sounds cheerful and well on the phone. I do not appreciate any SOB. Speech and thought processing are grossly intact. Patient reported vitals:  Assessment and Plan:  1. Controlled type 2 diabetes mellitus without complication, without long-term current use of insulin ([place]) We reviewed diagnosis of diabetes.  This is the first time he has been elevated with A1c.  He does have a significant family history of diabetes.  We discussed low carbohydrate diet.  We discussed importance of daily exercise to help with blood sugar control.  I do not think we need to start medications right now, but can follow-up with him  after CT scan is completed (see below)  2. Elevated lipase CAT scan has been scheduled for next week.  We scheduled a follow-up visit the next day to review these results and come up with a follow-up plan.  3. Low testosterone in male He is interested in doing testosterone replacement.  Previously he was on AndroGel.  He did not take this for very long.  We discussed other options including injections and self injections that are now available.  He would like to try something to help with libido and sexual function.  We also discussed the importance of daily exercise, weight loss, blood pressure control, and blood sugar control as it affects energy level, libido and erectile ability.  4. Subclinical hypothyroidism Continue to monitor.  He is willing to try medications that will make him feel better, but not certainly need to treat thyroid at this time and would focus on lifestyle changes to help with weight loss and blood sugar first.  I think it is reasonable for [place] to keep close tabs on his thyroid function and plan to recheck with next set of blood work.  5. Sleep apnea He was able to call you to follow-up visit with pulmonology to discuss sleep apnea.  Previously was borderline but insurance would not approve for updated machine.  Follow Up Instructions:  Follow-up visit will be next week.  He is going to monitor his blood pressures and will give me an updated report next week.  [id number] [id number] [id number] 11-20 [id number] 21-30 I did not refer this patient for an OV  in the next 24 hours for this/these issue(s).  I discussed the assessment and treatment plan with the patient. The patient was provided an opportunity to ask questions and all were answered. The patient agreed with the plan and demonstrated an understanding of the instructions.   The patient was advised to call back or seek an in-person evaluation if the symptoms worsen or if the condition fails to improve as anticipated.  I provided 25  minutes of non-face-to-face time during this encounter.  More than half of this time was spent reviewing importance of lifestyle changes, decrease sodium diet for blood pressure control, low carbohydrate diet for blood sugar control.   Micheline Rough, MD

## 2019-05-02 NOTE — Assessment & Plan Note (Signed)
He does meet criteria for upper airway resistance syndrome and likely has OSA and more importantly he is symptomatic.  He has gained some weight in the last 2 years. We will repeat home sleep test and if he meets criteria then get him started on on auto CPAP with full facemask If for some reason he does not meet criteria, he is still willing to purchase his own machine and will provide him prescription and settings We discussed dental appliance and he prefers CPAP to this

## 2019-05-02 NOTE — Addendum Note (Signed)
Addended by: Nena Polio on: 05/02/2019 03:57 PM   Modules accepted: Orders

## 2019-05-02 NOTE — Patient Instructions (Signed)
Schedule home sleep test. If for some reason you do not meet criteria, then you will have to purchase your own machine and we will provide prescription

## 2019-05-08 ENCOUNTER — Ambulatory Visit
Admission: RE | Admit: 2019-05-08 | Discharge: 2019-05-08 | Disposition: A | Discharge status: Home / Self Care | Payer: 59 | Source: Ambulatory Visit | Attending: Family Medicine | Admitting: Family Medicine

## 2019-05-08 DIAGNOSIS — R1084 Generalized abdominal pain: Secondary | ICD-10-CM

## 2019-05-08 DIAGNOSIS — R1013 Epigastric pain: Secondary | ICD-10-CM

## 2019-05-08 DIAGNOSIS — R748 Abnormal levels of other serum enzymes: Secondary | ICD-10-CM

## 2019-05-08 DIAGNOSIS — R110 Nausea: Secondary | ICD-10-CM

## 2019-05-08 MED ORDER — IOPAMIDOL (ISOVUE-300) INJECTION 61%
100.0000 mL | Freq: Once | INTRAVENOUS | Status: AC | PRN
  Administered 2019-05-08: 10:00:00 100 mL via INTRAVENOUS

## 2019-05-09 ENCOUNTER — Telehealth (INDEPENDENT_AMBULATORY_CARE_PROVIDER_SITE_OTHER): Payer: 59 | Admitting: Family Medicine

## 2019-05-09 ENCOUNTER — Other Ambulatory Visit: Payer: Self-pay

## 2019-05-09 VITALS — BP 128/75

## 2019-05-09 DIAGNOSIS — R7989 Other specified abnormal findings of blood chemistry: Secondary | ICD-10-CM

## 2019-05-09 DIAGNOSIS — R748 Abnormal levels of other serum enzymes: Secondary | ICD-10-CM

## 2019-05-09 DIAGNOSIS — E039 Hypothyroidism, unspecified: Secondary | ICD-10-CM

## 2019-05-09 MED ORDER — TESTOSTERONE 20.25 MG/ACT (1.62%) TD GEL: 40.5000 mg | Freq: Every morning | TRANSDERMAL | 2 refills | Status: DC

## 2019-05-09 NOTE — Progress Notes (Signed)
Virtual Visit via Video Note  I connected with Walter Jordan.  on 05/09/19 at 11:30 AM EST by a video enabled telemedicine application and verified that I am speaking with the correct person using two identifiers.  Location patient: home Location provider:work or home office Persons participating in the virtual visit: patient, provider  I discussed the limitations of evaluation and management by telemedicine and the availability of in person appointments. The patient expressed understanding and agreed to proceed.   Walter Jordan. DOB: [birth year] Encounter date: 05/09/2019  This is a 53 y.o. male who presents to review CT results   History of present illness: CT abdomen pelvis recently ordered for abdominal discomfort and elevated lipase  Completed CT abdomen pelvis yesterday: No evidence of pancreatitis or other acute findings.  Mild hepatic steatosis.  Mildly enlarged prostate.  At previous visit, we reviewed new diagnosis of diabetes, low testosterone levels, sleep apnea  Cut out sodas, cut out desserts, cut out evening cereal and has lost a couple of pounds. BP was better at recent visit with Dr. Elsworth Soho. He is waiting on them to call him for machine.    No Known Allergies No outpatient medications have been marked as taking for the 05/09/19 encounter (Telemedicine) with Caren Macadam, MD.   Current Facility-Administered Medications for the 05/09/19 encounter (Telemedicine) with Caren Macadam, MD  Medication  . 0.9 %  sodium chloride infusion    Review of Systems  Constitutional: Positive for fatigue. Negative for chills and fever.  Respiratory: Negative for cough, chest tightness, shortness of breath and wheezing.   Cardiovascular: Negative for chest pain, palpitations and leg swelling.  Gastrointestinal: Negative for abdominal pain (no change since 12/7 visit) and nausea.    Objective:  BP 128/75       BP Readings from Last 3 Encounters:  05/09/19 128/75   05/02/19 126/78  04/28/19 (!) 132/92   Wt Readings from Last 3 Encounters:  05/02/19 232 lb (105.2 kg)  04/28/19 232 lb 12.8 oz (105.6 kg)  03/28/19 228 lb (103.4 kg)    EXAM:  GENERAL: alert, oriented, appears well and in no acute distress  HEENT: atraumatic, conjunctiva clear, no obvious abnormalities on inspection of external nose and ears  NECK: normal movements of the head and neck  LUNGS: on inspection no signs of respiratory distress, breathing rate appears normal, no obvious gross SOB, gasping or wheezing  CV: no obvious cyanosis  MS: moves all visible extremities without noticeable abnormality  PSYCH/NEURO: pleasant and cooperative, no obvious depression or anxiety, speech and thought processing grossly intact   Assessment/Plan  1. Low testosterone in male We are going to restart testosterone replacement. We did discuss that insurance coverage is variable for this, so will work with him on this. Will plan to recheck levels in 1 mo time. PSA will need recheck in 3 mo after starting therapy. Discussed new medication(s) today with patient. Discussed potential side effects and patient verbalized understanding.  - Testosterone; Future - Testosterone (ANDROGEL PUMP) 20.25 MG/ACT (1.62%) GEL; Place 40.5 mg onto the skin every morning.  Dispense: 75 g; Refill: 2  2. Elevated lipase Will recheck in Jan.  - Lipase; Future  3. Hypothyroidism, unspecified type - TSH; Future - T4, free; Future - T3, Free; Future  Return for pending bloodwork.    I discussed the assessment and treatment plan with the patient. The patient was provided an opportunity to ask questions and all were answered. The patient agreed with  the plan and demonstrated an understanding of the instructions.   The patient was advised to call back or seek an in-person evaluation if the symptoms worsen or if the condition fails to improve as anticipated.  I provided 18 minutes of non-face-to-face time  during this encounter.   Micheline Rough, MD

## 2019-05-12 NOTE — Progress Notes (Signed)
Left a detailed message at the pts cell number to call back for a morning lab appt as below.

## 2019-05-19 ENCOUNTER — Other Ambulatory Visit: Payer: Self-pay | Admitting: Family Medicine

## 2019-05-19 ENCOUNTER — Other Ambulatory Visit (INDEPENDENT_AMBULATORY_CARE_PROVIDER_SITE_OTHER): Payer: 59

## 2019-05-19 ENCOUNTER — Other Ambulatory Visit: Payer: Self-pay

## 2019-05-19 DIAGNOSIS — E039 Hypothyroidism, unspecified: Secondary | ICD-10-CM | POA: Diagnosis not present

## 2019-05-19 DIAGNOSIS — R748 Abnormal levels of other serum enzymes: Secondary | ICD-10-CM

## 2019-05-19 DIAGNOSIS — R7989 Other specified abnormal findings of blood chemistry: Secondary | ICD-10-CM

## 2019-05-19 LAB — TSH: TSH: 9.08 u[IU]/mL — ABNORMAL HIGH (ref 0.35–4.50)

## 2019-05-19 LAB — T3, FREE: T3, Free: 3.2 pg/mL (ref 2.3–4.2)

## 2019-05-19 LAB — TESTOSTERONE: Testosterone: 229.6 ng/dL — ABNORMAL LOW (ref 300.00–890.00)

## 2019-05-19 LAB — T4, FREE: Free T4: 0.83 ng/dL (ref 0.60–1.60)

## 2019-05-19 LAB — LIPASE: Lipase: 129 U/L — ABNORMAL HIGH (ref 11.0–59.0)

## 2019-05-20 MED ORDER — LEVOTHYROXINE SODIUM 50 MCG PO TABS: 50.0000 ug | ORAL_TABLET | Freq: Every day | ORAL | 0 refills | Status: DC

## 2019-05-25 ENCOUNTER — Other Ambulatory Visit: Payer: Self-pay | Admitting: Family Medicine

## 2019-05-25 DIAGNOSIS — G8929 Other chronic pain: Secondary | ICD-10-CM

## 2019-05-28 MED ORDER — HYDROCODONE-ACETAMINOPHEN 10-325 MG PO TABS: 1.0000 | ORAL_TABLET | Freq: Two times a day (BID) | ORAL | 0 refills | Status: DC | PRN

## 2019-06-11 ENCOUNTER — Encounter: Payer: Self-pay | Admitting: Orthopaedic Surgery

## 2019-06-11 ENCOUNTER — Encounter: Payer: Self-pay | Admitting: Family Medicine

## 2019-06-11 ENCOUNTER — Ambulatory Visit: Payer: Self-pay

## 2019-06-11 ENCOUNTER — Ambulatory Visit: Payer: 59 | Admitting: Orthopaedic Surgery

## 2019-06-11 ENCOUNTER — Other Ambulatory Visit: Payer: Self-pay

## 2019-06-11 ENCOUNTER — Ambulatory Visit (INDEPENDENT_AMBULATORY_CARE_PROVIDER_SITE_OTHER): Payer: 59 | Admitting: Family Medicine

## 2019-06-11 ENCOUNTER — Other Ambulatory Visit: Payer: Self-pay | Admitting: Family Medicine

## 2019-06-11 VITALS — BP 100/62 | HR 72 | Temp 97.3°F | Ht 70.75 in | Wt 229.9 lb

## 2019-06-11 DIAGNOSIS — E119 Type 2 diabetes mellitus without complications: Secondary | ICD-10-CM | POA: Diagnosis not present

## 2019-06-11 DIAGNOSIS — M25551 Pain in right hip: Secondary | ICD-10-CM | POA: Diagnosis not present

## 2019-06-11 DIAGNOSIS — Z0000 Encounter for general adult medical examination without abnormal findings: Secondary | ICD-10-CM

## 2019-06-11 DIAGNOSIS — R7989 Other specified abnormal findings of blood chemistry: Secondary | ICD-10-CM

## 2019-06-11 DIAGNOSIS — E039 Hypothyroidism, unspecified: Secondary | ICD-10-CM

## 2019-06-11 DIAGNOSIS — R748 Abnormal levels of other serum enzymes: Secondary | ICD-10-CM

## 2019-06-11 MED ORDER — TESTOSTERONE CYPIONATE 200 MG/ML IM SOLN: 200.0000 mg | INTRAMUSCULAR | 5 refills | Status: DC

## 2019-06-11 MED ORDER — CYCLOBENZAPRINE HCL 10 MG PO TABS: 10.0000 mg | ORAL_TABLET | Freq: Three times a day (TID) | ORAL | 3 refills | Status: DC | PRN

## 2019-06-11 NOTE — Progress Notes (Signed)
Walter Jordan. DOB: 02/27/1966 Encounter date: 06/11/2019  This is a 54 y.o. male who presents for complete physical   History of present illness/Additional concerns:  Last visit was in December.  At that time we started testosterone replacement therapy. Very expensive - was not covered by insurance. Androgel pump 1.62% was $89 and then increased to $110.   Recently had elevated lipase, slightly higher on lab recheck.  He does have an appointment with gastro neurology, Dr. Loletha Carrow on the 25th. Not having the abdominal discomfort like he did in past. 2 days in a row about 2 weeks ago - where he had all red in stool. Has had this in past; and usually would resolve. Has had normal BM since then. Does usually do fiber cereal which helps; sometimes miralax. If he overeats, he pays for it. No GI illness in last few months.   Has appointment today with ortho today for back. Can't sleep on right side, feels like right side/right hip bothering him.  Still has pain within the left hand.  Cannot fully close his left hand.  Did see Ortho for this in the past.  According to know, Ortho was hoping that prednisone dose would help with back pain as well as hand pain.  Past Medical History:  Diagnosis Date  . Allergic rhinitis   . Allergy   . Arthritis    back  . ED (erectile dysfunction)   . Elevated lipase   . Enlarged prostate   . GERD (gastroesophageal reflux disease)   . Hepatic steatosis   . Hypothyroidism   . Low back pain   . Sleep apnea    had sleep study 09-10-16- needs new cpap   Past Surgical History:  Procedure Laterality Date  . COLONOSCOPY  03/19/2008; 2018   ext. hems   . HERNIA REPAIR     x2 1968, inguinal 1973  . WISDOM TOOTH EXTRACTION     No Known Allergies Current Meds  Medication Sig  . Amino Acids (AMINO ACID PO) Take by mouth daily. Arginine po daily  . HYDROcodone-acetaminophen (NORCO) 10-325 MG tablet Take 1 tablet by mouth 2 (two) times daily as needed.  Marland Kitchen  levothyroxine (SYNTHROID) 50 MCG tablet Take 1 tablet (50 mcg total) by mouth daily before breakfast.  . Multiple Vitamins-Minerals (MEGA MULTIVITAMIN FOR MEN PO) Take by mouth daily.    Marland Kitchen OVER THE COUNTER MEDICATION Beta Prostate  . Testosterone (ANDROGEL PUMP) 20.25 MG/ACT (1.62%) GEL Place 40.5 mg onto the skin every morning.   Current Facility-Administered Medications for the 06/11/19 encounter (Office Visit) with Caren Macadam, MD  Medication  . 0.9 %  sodium chloride infusion   Social History   Tobacco Use  . Smoking status: Never Smoker  . Smokeless tobacco: Never Used  Substance Use Topics  . Alcohol use: Yes    Comment: social   Family History  Problem Relation Age of Onset  . Cancer Father        sarcoma- both kidneys removed  . Diabetes Father        s/p renal transplant  . Arthritis Mother   . High blood pressure Mother   . High Cholesterol Mother   . Heart disease Mother   . Diabetes Brother   . Diabetes Maternal Grandmother   . Diabetes Paternal Grandmother   . Cancer Paternal Grandmother        colon  . Depression Other   . Diabetes Other   . Hypertension Other   .  Kidney disease Other   . Obesity Sister   . Stroke Maternal Grandfather 60       tobacco abuse  . Alcohol abuse Maternal Grandfather   . Early death Paternal Grandfather        accidental  . Prostate cancer Maternal Uncle   . Heart attack Maternal Uncle 60  . Prostate cancer Maternal Uncle   . Cancer Paternal Uncle        uncertain type  . Alcohol abuse Paternal Uncle   . Colon polyps Neg Hx   . Rectal cancer Neg Hx   . Stomach cancer Neg Hx   . Esophageal cancer Neg Hx   . Pancreatic cancer Neg Hx      Review of Systems  Constitutional: Negative for activity change, appetite change, chills, fatigue (energy level has improved), fever and unexpected weight change.  HENT: Negative for congestion, ear pain, hearing loss, sinus pressure, sinus pain, sore throat and trouble  swallowing.   Eyes: Negative for pain and visual disturbance.  Respiratory: Negative for cough, chest tightness, shortness of breath and wheezing.   Cardiovascular: Negative for chest pain, palpitations and leg swelling.  Gastrointestinal: Positive for blood in stool. Negative for abdominal distention, abdominal pain, constipation, diarrhea, nausea and vomiting.       Belly has felt better since last visit.  Genitourinary: Negative for decreased urine volume, difficulty urinating, dysuria, penile pain and testicular pain.  Musculoskeletal: Positive for arthralgias (right hip; left hand) and back pain. Negative for joint swelling.  Skin: Negative for rash.  Neurological: Negative for dizziness, weakness, numbness and headaches.  Hematological: Negative for adenopathy. Does not bruise/bleed easily.  Psychiatric/Behavioral: Negative for agitation, sleep disturbance and suicidal ideas. The patient is not nervous/anxious.     CBC:  Lab Results  Component Value Date   WBC 4.3 04/28/2019   HGB 15.5 04/28/2019   HCT 45.6 04/28/2019   MCH 31.1 04/30/2012   MCHC 34.0 04/28/2019   RDW 13.5 04/28/2019   PLT 258.0 04/28/2019   CMP: Lab Results  Component Value Date   NA 139 04/28/2019   K 4.4 04/28/2019   CL 104 04/28/2019   CO2 28 04/28/2019   GLUCOSE 102 (H) 04/28/2019   BUN 16 04/28/2019   CREATININE 1.05 04/28/2019   CREATININE 1.07 04/30/2012   CALCIUM 9.2 04/28/2019   PROT 7.0 04/28/2019   BILITOT 0.9 04/28/2019   ALKPHOS 70 04/28/2019   ALT 31 04/28/2019   AST 21 04/28/2019   LIPID: Lab Results  Component Value Date   CHOL 173 04/28/2019   TRIG 83.0 04/28/2019   HDL 48.60 04/28/2019   LDLCALC 108 (H) 04/28/2019    Objective:  BP 100/62 (BP Location: Left Arm, Patient Position: Sitting, Cuff Size: Large)   Pulse 72   Temp (!) 97.3 F (36.3 C) (Temporal)   Ht 5' 10.75" (1.797 m)   Wt 229 lb 14.4 oz (104.3 kg)   SpO2 98%   BMI 32.29 kg/m   Weight: 229 lb 14.4 oz  (104.3 kg)   BP Readings from Last 3 Encounters:  06/11/19 100/62  05/09/19 128/75  05/02/19 126/78   Wt Readings from Last 3 Encounters:  06/11/19 229 lb 14.4 oz (104.3 kg)  05/02/19 232 lb (105.2 kg)  04/28/19 232 lb 12.8 oz (105.6 kg)    Physical Exam Constitutional:      General: He is not in acute distress.    Appearance: He is well-developed.  HENT:     Head: Normocephalic  and atraumatic.     Right Ear: External ear normal.     Left Ear: External ear normal.     Nose: Nose normal.     Mouth/Throat:     Pharynx: No oropharyngeal exudate.  Eyes:     Conjunctiva/sclera: Conjunctivae normal.     Pupils: Pupils are equal, round, and reactive to light.  Neck:     Thyroid: No thyromegaly.  Cardiovascular:     Rate and Rhythm: Normal rate and regular rhythm.     Heart sounds: Normal heart sounds. No murmur. No friction rub. No gallop.   Pulmonary:     Effort: Pulmonary effort is normal. No respiratory distress.     Breath sounds: Normal breath sounds. No stridor. No wheezing or rales.  Abdominal:     General: Bowel sounds are normal.     Palpations: Abdomen is soft.  Musculoskeletal:        General: Normal range of motion.     Cervical back: Neck supple.     Comments: He does have some right lateral and anterior groin pain with external rotation of the right hip.  Internal rotation of the right hip creates more sciatic and lower right back and hip pain.  Cannot fully close fist on left hand.  Skin:    General: Skin is warm and dry.  Neurological:     Mental Status: He is alert and oriented to person, place, and time.  Psychiatric:        Behavior: Behavior normal.        Thought Content: Thought content normal.        Judgment: Judgment normal.     Assessment/Plan: Health Maintenance Due  Topic Date Due  . PNEUMOCOCCAL POLYSACCHARIDE VACCINE AGE 20-64 HIGH RISK  07/26/1967  . FOOT EXAM  07/26/1975  . OPHTHALMOLOGY EXAM  07/26/1975  . URINE MICROALBUMIN   07/26/1975   Health Maintenance reviewed.  1. Preventative health care Exercise is limited by back and leg pain currently.  He is working on healthier lifestyle to be mindful of blood sugar.  2. Elevated lipase Has a follow-up appointment next week with GI.  He is not having any abdominal pain, nausea, diarrhea.  I will follow along with their recommendations.  He has had blood in the stool since his last encounter, with his last colonoscopy being in 2018 and recommendation for repeat in 5 years (2023). - Lipase; Future - Comprehensive metabolic panel; Future  3. Controlled type 2 diabetes mellitus without complication, without long-term current use of insulin ([place]) We will recheck A1c in March, which will have been 3 months from previous. - Hemoglobin A1c; Future  4. Low testosterone in male AndroGel is very expensive for patient.  It appears that testosterone injections are tier 1 on his formulary so we are going to try this for cost savings.  Recommended to start with once monthly injections and we can adjust based on lab results.  He will schedule nurse visit on his way out. - PSA; Future  5. Hypothyroidism, unspecified type Has felt some improvement with energy level since starting Synthroid.  We will recheck thyroid in March with other blood work. - TSH; Future   Return for bloodwork 3/7 or after; follow up appointment pending these results.  Micheline Rough, MD

## 2019-06-11 NOTE — Progress Notes (Signed)
Office Visit Note   Patient: Walter Jordan.           Date of Birth: 1965/12/20           MRN: [id number] Visit Date: 06/11/2019              Requested by: Caren Macadam, MD [place],  [place] [address] PCP: Caren Macadam, MD   Assessment & Plan: Visit Diagnoses:  1. Right hip pain     Plan: Impression is right hip osteoarthritis versus lumbar radiculopathy.  At this point, believe we should refer the patient to Dr. Junius Roads for diagnostic and hopefully therapeutic cortisone injection to the right hip joint.  If he fails to get relief of symptoms following the injection, we will likely repeat his lumbar spine MRI.  Follow-up with [place] as needed.  Follow-Up Instructions: Return if symptoms worsen or fail to improve.   Orders:  Orders Placed This Encounter  Procedures  . XR HIP UNILAT W OR W/O PELVIS 2-3 VIEWS RIGHT  . US Guided Needle Placement - No Linked Charges   No orders of the defined types were placed in this encounter.     Procedures: No procedures performed   Clinical Data: No additional findings.   Subjective: Chief Complaint  Patient presents with  . Right Hip - Pain    HPI patient is a pleasant 54 year old gentleman who comes in today with recurrent right lateral hip pain.  This has been ongoing for about a year and a half now.  He has been sitting [place] for this where it was felt that this was all coming from his back.  The pain he has is to the bilateral lower back and radiates to the right lateral hip.  He does note very occasional groin pain.  He describes this as a constant ache worse when he is lying on the right side for an extended period of time or with forward flexion of the hip region going from a seated to standing position.  He has been taking Norco which initially did help but is no longer decreasing his symptoms.  He was given prednisone by [place] at his last visit which did not help.  No numbness, tingling or burning.  He  has had previous epidural steroid injections which at one point helped his back pain but never his lateral hip pain.  No previous hip injection.  Review of Systems as detailed in HPI.  All others reviewed and are negative.   Objective: Vital Signs: There were no vitals taken for this visit.  Physical Exam well-developed well-nourished gentleman in no acute distress.  Alert and oriented x3.  Ortho Exam examination of his lumbar spine reveals no spinous tenderness.  Very mild right-sided paraspinous tenderness.  No pain with lumbar flexion, extension or rotation.  Negative straight leg raise.  Negative logroll.  Positive FADIR at the extremes of internal rotation.  Very minimal tenderness to the greater trochanter.  He is neurovascularly intact distally.  Specialty Comments:  No specialty comments available.  Imaging: US Guided Needle Placement - No Linked Charges  Result Date: 06/11/2019 Please see Notes tab for imaging impression.  XR HIP UNILAT W OR W/O PELVIS 2-3 VIEWS RIGHT  Result Date: 06/11/2019 Mild joint space narrowing    PMFS History: Patient Active Problem List   Diagnosis Date Noted  . Low testosterone in male 06/11/2019  . Controlled type 2 diabetes mellitus without complication, without long-term current use  of insulin ([place]) 06/11/2019  . Pain in left hand 03/28/2019  . Hx of adenomatous colonic polyps 10/25/2017  . Lumbar radiculopathy 05/11/2016  . Impaired glucose tolerance 03/25/2012  . VERTIGO 02/22/2010  . Obstructive sleep apnea 09/27/2009  . ERECTILE DYSFUNCTION, ORGANIC 09/07/2009  . CHEST PAIN 03/13/2008  . OTHER SYMPTOMS INVOLVING DIGESTIVE SYSTEM OTHER 03/12/2008  . RECTAL BLEEDING, HX OF 03/12/2008  . Hypothyroidism 02/22/2007  . Allergic rhinitis 02/22/2007  . GERD 02/22/2007  . LOW BACK PAIN 02/22/2007   Past Medical History:  Diagnosis Date  . Allergic rhinitis   . Allergy   . Arthritis    back  . ED (erectile dysfunction)   .  Elevated lipase   . Enlarged prostate   . GERD (gastroesophageal reflux disease)   . Hepatic steatosis   . Hypothyroidism   . Low back pain   . Sleep apnea    had sleep study 09-10-16- needs new cpap    Family History  Problem Relation Age of Onset  . Cancer Father        sarcoma- both kidneys removed  . Diabetes Father        s/p renal transplant  . Arthritis Mother   . High blood pressure Mother   . High Cholesterol Mother   . Heart disease Mother   . Diabetes Brother   . Diabetes Maternal Grandmother   . Diabetes Paternal Grandmother   . Cancer Paternal Grandmother        colon  . Depression Other   . Diabetes Other   . Hypertension Other   . Kidney disease Other   . Obesity Sister   . Stroke Maternal Grandfather 60       tobacco abuse  . Alcohol abuse Maternal Grandfather   . Early death Paternal Grandfather        accidental  . Prostate cancer Maternal Uncle   . Heart attack Maternal Uncle 60  . Prostate cancer Maternal Uncle   . Cancer Paternal Uncle        uncertain type  . Alcohol abuse Paternal Uncle   . Colon polyps Neg Hx   . Rectal cancer Neg Hx   . Stomach cancer Neg Hx   . Esophageal cancer Neg Hx   . Pancreatic cancer Neg Hx     Past Surgical History:  Procedure Laterality Date  . COLONOSCOPY  03/19/2008; 2018   ext. hems   . HERNIA REPAIR     x2 1968, inguinal 1973  . WISDOM TOOTH EXTRACTION     Social History   Occupational History  . Not on file  Tobacco Use  . Smoking status: Never Smoker  . Smokeless tobacco: Never Used  Substance and Sexual Activity  . Alcohol use: Yes    Comment: social  . Drug use: No  . Sexual activity: Not on file

## 2019-06-11 NOTE — Progress Notes (Signed)
Subjective: Patient is here for ultrasound-guided intra-articular right hip injection.   Anterolateral pain for the past year.  Objective:  Pain and stiffness with IR.  Procedure: Ultrasound-guided right hip injection: After sterile prep with Betadine, injected 8 cc 1% lidocaine without epinephrine and 40 mg methylprednisolone using a 22-gauge spinal needle, passing the needle through the iliofemoral ligament into the femoral head/neck junction.  Injectate seen filling joint capsule.  Not much difference in pain immediately.

## 2019-06-16 ENCOUNTER — Ambulatory Visit (INDEPENDENT_AMBULATORY_CARE_PROVIDER_SITE_OTHER): Payer: 59 | Admitting: *Deleted

## 2019-06-16 ENCOUNTER — Encounter: Payer: Self-pay | Admitting: Gastroenterology

## 2019-06-16 ENCOUNTER — Ambulatory Visit: Payer: 59 | Admitting: Gastroenterology

## 2019-06-16 ENCOUNTER — Other Ambulatory Visit: Payer: Self-pay

## 2019-06-16 VITALS — BP 124/90 | HR 68 | Temp 98.3°F | Ht 70.0 in | Wt 230.2 lb

## 2019-06-16 DIAGNOSIS — R748 Abnormal levels of other serum enzymes: Secondary | ICD-10-CM | POA: Diagnosis not present

## 2019-06-16 DIAGNOSIS — R7989 Other specified abnormal findings of blood chemistry: Secondary | ICD-10-CM

## 2019-06-16 MED ORDER — TESTOSTERONE CYPIONATE 200 MG/ML IM SOLN
1.0000 mg | INTRAMUSCULAR | Status: DC
  Administered 2019-06-16 – 2019-10-09 (×3): 2 mg via INTRAMUSCULAR

## 2019-06-16 NOTE — Patient Instructions (Signed)
If you are age 54 or older, your body mass index should be between 23-30. Your Body mass index is 33.04 kg/m. If this is out of the aforementioned range listed, please consider follow up with your Primary Care Provider.  If you are age 25 or younger, your body mass index should be between 19-25. Your Body mass index is 33.04 kg/m. If this is out of the aformentioned range listed, please consider follow up with your Primary Care Provider.   Follow up as needed. [phone]  It was a pleasure to see you today!  Dr. Loletha Carrow

## 2019-06-16 NOTE — Progress Notes (Signed)
Per orders of Dr. Ethlyn Gallery injection of Testosterone given by Zacarias Pontes. Patient tolerated injection well.

## 2019-06-16 NOTE — Progress Notes (Signed)
[facility] Progress Note  Chief Complaint: Elevated serum lipase  Subjective  History: Last seen for his for screening colonoscopy with me May 2018. 4 mm descending colon adenomatous polyp removed. 5-year recall recommended ( that will be changed to a 7-year recall based on updated guidelines)  Kristin was sent by primary care for elevated lipase.  When he saw primary care last month he was complaining of about 4 to 6 weeks of some intermittent upset stomach.  It was difficult to characterize, but felt "not right".  Sometimes it was like nausea, but no vomiting.  Is not having dysphagia odynophagia or heartburn.  The symptoms have since resolved, he denies anorexia or weight loss.  He is gained about 15 pounds in this last year. Bowel habits are regular without rectal bleeding.  ROS: Cardiovascular:  no chest pain Respiratory: no dyspnea  The patient's Past Medical, Family and Social History were reviewed and are on file in the EMR. He rarely drinks alcohol. Objective:  Med list reviewed  Current Outpatient Medications:  .  Amino Acids (AMINO ACID PO), Take by mouth daily. Arginine po daily, Disp: , Rfl:  .  cyclobenzaprine (FLEXERIL) 10 MG tablet, Take 1 tablet (10 mg total) by mouth 3 (three) times daily as needed for muscle spasms., Disp: 90 tablet, Rfl: 3 .  HYDROcodone-acetaminophen (NORCO) 10-325 MG tablet, Take 1 tablet by mouth 2 (two) times daily as needed., Disp: 60 tablet, Rfl: 0 .  levothyroxine (SYNTHROID) 50 MCG tablet, Take 1 tablet (50 mcg total) by mouth daily before breakfast., Disp: 90 tablet, Rfl: 0 .  Multiple Vitamins-Minerals (MEGA MULTIVITAMIN FOR MEN PO), Take by mouth daily.  , Disp: , Rfl:  .  OVER THE COUNTER MEDICATION, Beta Prostate, Disp: , Rfl:  .  testosterone cypionate (DEPOTESTOSTERONE CYPIONATE) 200 MG/ML injection, Inject 1 mL (200 mg total) into the muscle every 28 (twenty-eight) days., Disp: 1 mL, Rfl: 5  Current Facility-Administered  Medications:  .  0.9 %  sodium chloride infusion, 500 mL, Intravenous, Continuous, Danis, Estill Cotta III, MD .  testosterone cypionate (DEPOTESTOSTERONE CYPIONATE) injection 2 mg, 2 mg, Intramuscular, Q28 days, Koberlein, Junell C, MD, 2 mg at 06/16/19 0914   Vital signs in last 24 hrs: Vitals:   06/16/19 1533  BP: 124/90  Pulse: 68  Temp: 98.3 F (36.8 C)    Physical Exam  Well-appearing  HEENT: sclera anicteric, oral mucosa moist without lesions  Neck: supple, no thyromegaly, JVD or lymphadenopathy  Cardiac: RRR without murmurs, S1S2 heard, no peripheral edema  Pulm: clear to auscultation bilaterally, normal RR and effort noted  Abdomen: soft, no tenderness, with active bowel sounds. No guarding or palpable hepatosplenomegaly.  Skin; warm and dry, no jaundice or rash  Recent Labs:  CMP Latest Ref Rng & Units 04/28/2019 10/25/2017 04/12/2016  Glucose 70 - 99 mg/dL 102(H) 79 93  BUN 6 - 23 mg/dL 16 10 15   Creatinine 0.40 - 1.50 mg/dL 1.05 1.12 1.31  Sodium 135 - 145 mEq/L 139 141 141  Potassium 3.5 - 5.1 mEq/L 4.4 4.7 4.5  Chloride 96 - 112 mEq/L 104 103 104  CO2 19 - 32 mEq/L 28 31 30   Calcium 8.4 - 10.5 mg/dL 9.2 9.4 9.3  Total Protein 6.0 - 8.3 g/dL 7.0 6.9 6.9  Total Bilirubin 0.2 - 1.2 mg/dL 0.9 0.8 1.1  Alkaline Phos 39 - 117 U/L 70 70 58  AST 0 - 37 U/L 21 25 22   ALT 0 -  53 U/L 31 34 28   Lipase 119 on 04/28/19, 129 on 05/19/19  (had been 20 in Dec 2013) Amylase normal at 84  Radiologic studies:  CLINICAL DATA:  Abdominal pain and nausea for 2 months. Elevated lipase.   EXAM: CT ABDOMEN AND PELVIS WITH CONTRAST   TECHNIQUE: Multidetector CT imaging of the abdomen and pelvis was performed using the standard protocol following bolus administration of intravenous contrast.   CONTRAST:  170mL ISOVUE-300 IOPAMIDOL (ISOVUE-300) INJECTION 61%   COMPARISON:  None.   FINDINGS: Lower Chest: No acute findings.   Hepatobiliary: No hepatic masses identified.  Mild diffuse hepatic steatosis. Gallbladder is unremarkable. No evidence of biliary ductal dilatation.   Pancreas:  No mass or inflammatory changes.   Spleen: Within normal limits in size and appearance.   Adrenals/Urinary Tract: No masses identified. Small benign-appearing cyst noted in midpole of left kidney. No evidence of ureteral calculi or hydronephrosis.   Stomach/Bowel: No evidence of obstruction, inflammatory process or abnormal fluid collections. Normal appendix visualized.   Vascular/Lymphatic: No pathologically enlarged lymph nodes. No abdominal aortic aneurysm.   Reproductive:  Mildly enlarged prostate gland.   Other:  None.   Musculoskeletal:  No suspicious bone lesions identified.   IMPRESSION: 1. No radiographic evidence of pancreatitis or other acute findings. 2. Mild hepatic steatosis. 3. Mildly enlarged prostate.     Electronically Signed   By: Marlaine Hind M.D.   On: 05/08/2019 10:49     @ASSESSMENTPLANBEGIN @ Assessment: Encounter Diagnosis  Name Primary?  . Elevated lipase Yes   He recently had some nonspecific upper digestive symptoms that is since resolved.  He does not seem to have medicines or medical problems that might cause isolated elevated lipase with normal amylase.  No evidence of pancreatic process, no significant alcohol use.  Overall, I think this is a benign elevation of lipase (about 2x ULN)  and have no further testing planned at present.  If he develops recurrent upper digestive symptoms, I asked him to let me know, at which point we would most likely pursue upper endoscopy.   20 minutes were spent on this encounter (including chart review, history/exam, counseling/coordination of care, and documentation)  Nelida Meuse III

## 2019-06-27 ENCOUNTER — Other Ambulatory Visit: Payer: Self-pay

## 2019-06-27 DIAGNOSIS — G4733 Obstructive sleep apnea (adult) (pediatric): Secondary | ICD-10-CM

## 2019-06-29 DIAGNOSIS — G4733 Obstructive sleep apnea (adult) (pediatric): Secondary | ICD-10-CM

## 2019-07-14 ENCOUNTER — Other Ambulatory Visit: Payer: Self-pay | Admitting: Family Medicine

## 2019-07-14 ENCOUNTER — Telehealth: Payer: Self-pay | Admitting: Pulmonary Disease

## 2019-07-14 DIAGNOSIS — G4733 Obstructive sleep apnea (adult) (pediatric): Secondary | ICD-10-CM | POA: Diagnosis not present

## 2019-07-14 DIAGNOSIS — G8929 Other chronic pain: Secondary | ICD-10-CM

## 2019-07-14 NOTE — Telephone Encounter (Signed)
Moderate OSA especially in the supine position. This will qualify him to get CPAP. Start auto CPAP 5 to 15 cm, mask of choice, office visit with APP in 6 weeks

## 2019-07-15 MED ORDER — HYDROCODONE-ACETAMINOPHEN 10-325 MG PO TABS: 1.0000 | ORAL_TABLET | Freq: Two times a day (BID) | ORAL | 0 refills | Status: DC | PRN

## 2019-07-17 ENCOUNTER — Ambulatory Visit (INDEPENDENT_AMBULATORY_CARE_PROVIDER_SITE_OTHER): Payer: 59

## 2019-07-17 ENCOUNTER — Ambulatory Visit: Payer: 59 | Admitting: Orthopaedic Surgery

## 2019-07-17 ENCOUNTER — Other Ambulatory Visit: Payer: Self-pay

## 2019-07-17 ENCOUNTER — Encounter: Payer: Self-pay | Admitting: Orthopaedic Surgery

## 2019-07-17 DIAGNOSIS — M5416 Radiculopathy, lumbar region: Secondary | ICD-10-CM

## 2019-07-17 DIAGNOSIS — R7989 Other specified abnormal findings of blood chemistry: Secondary | ICD-10-CM | POA: Diagnosis not present

## 2019-07-17 MED ORDER — TESTOSTERONE CYPIONATE 100 MG/ML IM SOLN: 200.0000 mg | INTRAMUSCULAR | Status: DC

## 2019-07-17 NOTE — Progress Notes (Signed)
Office Visit Note   Patient: Walter Jordan.           Date of Birth: [birth year]           MRN: [id number] Visit Date: 07/17/2019              Requested by: Caren Macadam, MD [place],  [place] [address] PCP: Caren Macadam, MD   Assessment & Plan: Visit Diagnoses:  1. Lumbar radiculopathy     Plan: At this point he has not gotten any relief from injections therefore we will need to get a new MRI of the lumbar spine to evaluate for interval changes and structural abnormalities.  Will call patient with results.  Follow-Up Instructions: Return if symptoms worsen or fail to improve.   Orders:  No orders of the defined types were placed in this encounter.  No orders of the defined types were placed in this encounter.     Procedures: No procedures performed   Clinical Data: No additional findings.   Subjective: Chief Complaint  Patient presents with  . Lower Back - Pain    Pleasant returns today for continued low back pain and right leg pain.  He states that he has a constant burning numbness on the lateral side of his calf.  His last ESI injection was January 2020.  Recent hip injection did not give him any relief.  He is noticing weakness in his right leg with using stairs.   Review of Systems   Objective: Vital Signs: There were no vitals taken for this visit.  Physical Exam  Ortho Exam Bilateral lower extremity exam shows no obvious difference in strength and sensation. Specialty Comments:  No specialty comments available.  Imaging: No results found.   PMFS History: Patient Active Problem List   Diagnosis Date Noted  . Low testosterone in male 06/11/2019  . Controlled type 2 diabetes mellitus without complication, without long-term current use of insulin ([place]) 06/11/2019  . Pain in left hand 03/28/2019  . Hx of adenomatous colonic polyps 10/25/2017  . Lumbar radiculopathy 05/11/2016  . Impaired glucose tolerance  03/25/2012  . VERTIGO 02/22/2010  . Obstructive sleep apnea 09/27/2009  . ERECTILE DYSFUNCTION, ORGANIC 09/07/2009  . CHEST PAIN 03/13/2008  . OTHER SYMPTOMS INVOLVING DIGESTIVE SYSTEM OTHER 03/12/2008  . RECTAL BLEEDING, HX OF 03/12/2008  . Hypothyroidism 02/22/2007  . Allergic rhinitis 02/22/2007  . GERD 02/22/2007  . LOW BACK PAIN 02/22/2007   Past Medical History:  Diagnosis Date  . Allergic rhinitis   . Allergy   . Arthritis    back  . ED (erectile dysfunction)   . Elevated lipase   . Enlarged prostate   . GERD (gastroesophageal reflux disease)   . Hepatic steatosis   . Hypothyroidism   . Low back pain   . Low testosterone   . Sleep apnea    had sleep study 09-10-16- needs new cpap    Family History  Problem Relation Age of Onset  . Cancer Father        sarcoma- both kidneys removed  . Diabetes Father        s/p renal transplant  . Arthritis Mother   . High blood pressure Mother   . High Cholesterol Mother   . Heart disease Mother   . Diabetes Brother   . Diabetes Maternal Grandmother   . Diabetes Paternal Grandmother   . Cancer Paternal Grandmother        colon  .  Depression Other   . Diabetes Other   . Hypertension Other   . Kidney disease Other   . Obesity Sister   . Stroke Maternal Grandfather 60       tobacco abuse  . Alcohol abuse Maternal Grandfather   . Early death Paternal Grandfather        accidental  . Prostate cancer Maternal Uncle   . Heart attack Maternal Uncle 60  . Prostate cancer Maternal Uncle   . Cancer Paternal Uncle        uncertain type  . Alcohol abuse Paternal Uncle   . Colon polyps Neg Hx   . Rectal cancer Neg Hx   . Stomach cancer Neg Hx   . Esophageal cancer Neg Hx   . Pancreatic cancer Neg Hx     Past Surgical History:  Procedure Laterality Date  . COLONOSCOPY  03/19/2008; 2018   ext. hems   . HERNIA REPAIR     x2 1968, inguinal 1973  . WISDOM TOOTH EXTRACTION     Social History   Occupational History  . Not  on file  Tobacco Use  . Smoking status: Never Smoker  . Smokeless tobacco: Never Used  Substance and Sexual Activity  . Alcohol use: Yes    Comment: social  . Drug use: No  . Sexual activity: Not on file

## 2019-07-17 NOTE — Addendum Note (Signed)
Addended by: Precious Bard on: 07/17/2019 09:43 AM   Modules accepted: Orders

## 2019-07-18 NOTE — Telephone Encounter (Signed)
Called the patient and made him aware of the results per Dr. Elsworth Soho. Order for CPAP placed. Patient scheduled for video visit on 09/08/19 with Rexene Edison, NP  Advised the patient if he is not contacted by DME company in 2 weeks regarding the machine to call our office. Patient voiced understanding. Nothing further needed at this time.

## 2019-07-25 ENCOUNTER — Other Ambulatory Visit: Payer: Self-pay

## 2019-07-28 ENCOUNTER — Other Ambulatory Visit (INDEPENDENT_AMBULATORY_CARE_PROVIDER_SITE_OTHER): Payer: 59

## 2019-07-28 ENCOUNTER — Other Ambulatory Visit: Payer: Self-pay

## 2019-07-28 DIAGNOSIS — E039 Hypothyroidism, unspecified: Secondary | ICD-10-CM

## 2019-07-28 DIAGNOSIS — R748 Abnormal levels of other serum enzymes: Secondary | ICD-10-CM | POA: Diagnosis not present

## 2019-07-28 DIAGNOSIS — E119 Type 2 diabetes mellitus without complications: Secondary | ICD-10-CM

## 2019-07-28 DIAGNOSIS — R7989 Other specified abnormal findings of blood chemistry: Secondary | ICD-10-CM

## 2019-07-28 LAB — COMPREHENSIVE METABOLIC PANEL
ALT: 24 U/L (ref 0–53)
AST: 19 U/L (ref 0–37)
Albumin: 4.3 g/dL (ref 3.5–5.2)
Alkaline Phosphatase: 71 U/L (ref 39–117)
BUN: 13 mg/dL (ref 6–23)
CO2: 29 mEq/L (ref 19–32)
Calcium: 9.3 mg/dL (ref 8.4–10.5)
Chloride: 103 mEq/L (ref 96–112)
Creatinine, Ser: 1.13 mg/dL (ref 0.40–1.50)
GFR: 81.83 mL/min (ref >60.00)
Glucose, Bld: 121 mg/dL — ABNORMAL HIGH (ref 70–99)
Potassium: 4.5 mEq/L (ref 3.5–5.1)
Sodium: 139 mEq/L (ref 135–145)
Total Bilirubin: 0.3 mg/dL (ref 0.2–1.2)
Total Protein: 7.1 g/dL (ref 6.0–8.3)

## 2019-07-28 LAB — TSH: TSH: 3.33 u[IU]/mL (ref 0.35–4.50)

## 2019-07-28 LAB — PSA: PSA: 1.79 ng/mL (ref 0.10–4.00)

## 2019-07-28 LAB — HEMOGLOBIN A1C: Hgb A1c MFr Bld: 6.2 % (ref 4.6–6.5)

## 2019-07-28 LAB — LIPASE: Lipase: 35 U/L (ref 11.0–59.0)

## 2019-08-07 ENCOUNTER — Other Ambulatory Visit: Payer: Self-pay | Admitting: Family Medicine

## 2019-08-07 MED ORDER — LEVOTHYROXINE SODIUM 50 MCG PO TABS: 50.0000 ug | ORAL_TABLET | Freq: Every day | ORAL | 0 refills | Status: DC

## 2019-08-07 NOTE — Telephone Encounter (Signed)
Forwarding to PCP CMA

## 2019-08-11 ENCOUNTER — Other Ambulatory Visit: Payer: Self-pay | Admitting: Family Medicine

## 2019-08-12 ENCOUNTER — Ambulatory Visit (INDEPENDENT_AMBULATORY_CARE_PROVIDER_SITE_OTHER): Payer: 59 | Admitting: *Deleted

## 2019-08-12 ENCOUNTER — Other Ambulatory Visit: Payer: Self-pay

## 2019-08-12 DIAGNOSIS — R7989 Other specified abnormal findings of blood chemistry: Secondary | ICD-10-CM | POA: Diagnosis not present

## 2019-08-12 MED ORDER — TESTOSTERONE CYPIONATE 200 MG/ML IM SOLN
200.0000 mg | Freq: Once | INTRAMUSCULAR | Status: AC
  Administered 2019-08-12: 200 mg via INTRAMUSCULAR

## 2019-08-12 NOTE — Progress Notes (Signed)
Per orders of Dr. Jerilee Hoh, injection of Testosterone Cypionate 200mg /ml given by Agnes Lawrence. Patient tolerated injection well.

## 2019-08-16 ENCOUNTER — Other Ambulatory Visit: Payer: Self-pay

## 2019-08-16 ENCOUNTER — Ambulatory Visit
Admission: RE | Admit: 2019-08-16 | Discharge: 2019-08-16 | Disposition: A | Discharge status: Home / Self Care | Payer: 59 | Source: Ambulatory Visit | Attending: Physician Assistant | Admitting: Physician Assistant

## 2019-08-16 DIAGNOSIS — M5416 Radiculopathy, lumbar region: Secondary | ICD-10-CM

## 2019-08-17 NOTE — Progress Notes (Signed)
F/u to discuss

## 2019-08-21 ENCOUNTER — Other Ambulatory Visit: Payer: Self-pay | Admitting: Family Medicine

## 2019-08-21 ENCOUNTER — Ambulatory Visit: Payer: 59 | Admitting: Orthopaedic Surgery

## 2019-08-21 ENCOUNTER — Encounter: Payer: Self-pay | Admitting: Physician Assistant

## 2019-08-21 ENCOUNTER — Other Ambulatory Visit: Payer: Self-pay

## 2019-08-21 DIAGNOSIS — M5416 Radiculopathy, lumbar region: Secondary | ICD-10-CM | POA: Diagnosis not present

## 2019-08-21 DIAGNOSIS — G8929 Other chronic pain: Secondary | ICD-10-CM

## 2019-08-21 DIAGNOSIS — M25569 Pain in unspecified knee: Secondary | ICD-10-CM

## 2019-08-21 NOTE — Telephone Encounter (Signed)
Please let patient know that Dr. Ethlyn Gallery is out of the office and per our pain management protocol other providers are not authorized to provide refills of narcotic pain medications. It appears patient has appointment today regarding this and therefore would advise if any medications needed urgently that they request a prescription from that provider. Thank you.

## 2019-08-21 NOTE — Progress Notes (Signed)
Office Visit Note   Patient: Walter Jordan.           Date of Birth: Mar 28, 1966           MRN: [id number] Visit Date: 08/21/2019              Requested by: Caren Macadam, MD [place],  [place] [address] PCP: Caren Macadam, MD   Assessment & Plan: Visit Diagnoses:  1. Lumbar radiculopathy     Plan: Impression is facet hypertrophy L4-5 with severe left foraminal stenosis with moderate to severe left lateral recess narrowing, chronic degenerative disc osteophyte L5-S1 contacting the descending S1 nerve root bilaterally, moderate to severe bilateral L5 foraminal stenosis, right extraforaminal disc protrusion L3-4, disc bulge with facet hypertrophy L2-3.  These changes have progressed compared to MRI in 2016.  The patient has had previous epidural steroid injections by Dr. Ernestina Patches with the last one being in January 2020.  He is trying to avoid surgery at this point and would like to be referred back to Dr. Ernestina Patches for repeat [facility] before being referred to neurosurgery.  He will follow-up with [place] as needed.   Follow-Up Instructions: Return if symptoms worsen or fail to improve.   Orders:  No orders of the defined types were placed in this encounter.  No orders of the defined types were placed in this encounter.     Procedures: No procedures performed   Clinical Data: No additional findings.   Subjective: Chief Complaint  Patient presents with  . Lower Back - Pain    HPI patient comes in to discuss recent MRI of the lumbar spine.  History of right lower extremity radiculopathy.  MRI from 08/16/2019 shows facet hypertrophy L4-5 with severe left foraminal stenosis with moderate to severe left lateral recess narrowing, chronic degenerative disc osteophyte L5-S1 contacting the descending S1 nerve root bilaterally, moderate to severe bilateral L5 foraminal stenosis, right extraforaminal disc protrusion L3-4, disc bulge with facet hypertrophy L2-3.  These  changes have progressed compared to MRI in 2016.  The patient has had previous epidural steroid injections by Dr. Ernestina Patches with the last one being in January 2020.  He is trying to avoid surgery at this point.     Objective: Vital Signs: There were no vitals taken for this visit.    Ortho Exam stable lumbar exam  Specialty Comments:  No specialty comments available.  Imaging: No new imaging   PMFS History: Patient Active Problem List   Diagnosis Date Noted  . Low testosterone in male 06/11/2019  . Controlled type 2 diabetes mellitus without complication, without long-term current use of insulin ([place]) 06/11/2019  . Pain in left hand 03/28/2019  . Hx of adenomatous colonic polyps 10/25/2017  . Lumbar radiculopathy 05/11/2016  . Impaired glucose tolerance 03/25/2012  . VERTIGO 02/22/2010  . Obstructive sleep apnea 09/27/2009  . ERECTILE DYSFUNCTION, ORGANIC 09/07/2009  . CHEST PAIN 03/13/2008  . OTHER SYMPTOMS INVOLVING DIGESTIVE SYSTEM OTHER 03/12/2008  . RECTAL BLEEDING, HX OF 03/12/2008  . Hypothyroidism 02/22/2007  . Allergic rhinitis 02/22/2007  . GERD 02/22/2007  . LOW BACK PAIN 02/22/2007   Past Medical History:  Diagnosis Date  . Allergic rhinitis   . Allergy   . Arthritis    back  . ED (erectile dysfunction)   . Elevated lipase   . Enlarged prostate   . GERD (gastroesophageal reflux disease)   . Hepatic steatosis   . Hypothyroidism   . Low back  pain   . Low testosterone   . Sleep apnea    had sleep study 09-10-16- needs new cpap    Family History  Problem Relation Age of Onset  . Cancer Father        sarcoma- both kidneys removed  . Diabetes Father        s/p renal transplant  . Arthritis Mother   . High blood pressure Mother   . High Cholesterol Mother   . Heart disease Mother   . Diabetes Brother   . Diabetes Maternal Grandmother   . Diabetes Paternal Grandmother   . Cancer Paternal Grandmother        colon  . Depression Other   . Diabetes  Other   . Hypertension Other   . Kidney disease Other   . Obesity Sister   . Stroke Maternal Grandfather 60       tobacco abuse  . Alcohol abuse Maternal Grandfather   . Early death Paternal Grandfather        accidental  . Prostate cancer Maternal Uncle   . Heart attack Maternal Uncle 60  . Prostate cancer Maternal Uncle   . Cancer Paternal Uncle        uncertain type  . Alcohol abuse Paternal Uncle   . Colon polyps Neg Hx   . Rectal cancer Neg Hx   . Stomach cancer Neg Hx   . Esophageal cancer Neg Hx   . Pancreatic cancer Neg Hx     Past Surgical History:  Procedure Laterality Date  . COLONOSCOPY  03/19/2008; 2018   ext. hems   . HERNIA REPAIR     x2 1968, inguinal 1973  . WISDOM TOOTH EXTRACTION     Social History   Occupational History  . Not on file  Tobacco Use  . Smoking status: Never Smoker  . Smokeless tobacco: Never Used  Substance and Sexual Activity  . Alcohol use: Yes    Comment: social  . Drug use: No  . Sexual activity: Not on file

## 2019-08-21 NOTE — Telephone Encounter (Signed)
I called the pt and informed him of the message below.  Patient is aware the message will be forwarded to PCP for review next week when she returns to the office.

## 2019-08-25 MED ORDER — HYDROCODONE-ACETAMINOPHEN 10-325 MG PO TABS: 1.0000 | ORAL_TABLET | Freq: Two times a day (BID) | ORAL | 0 refills | Status: DC | PRN

## 2019-09-08 ENCOUNTER — Encounter: Payer: Self-pay | Admitting: Pulmonary Disease

## 2019-09-08 ENCOUNTER — Telehealth (INDEPENDENT_AMBULATORY_CARE_PROVIDER_SITE_OTHER): Payer: 59 | Admitting: Pulmonary Disease

## 2019-09-08 DIAGNOSIS — G4733 Obstructive sleep apnea (adult) (pediatric): Secondary | ICD-10-CM | POA: Diagnosis not present

## 2019-09-08 NOTE — Progress Notes (Signed)
Virtual Visit via Video Note  I connected with Walter Jordan. on 09/08/19 at  2:00 PM EDT by a video enabled telemedicine application and verified that I am speaking with the correct person using two identifiers.  Location: Patient: Home Provider: Office - [facility] - [id number], [address], [place], [place] [address]  I discussed the limitations of evaluation and management by telemedicine and the availability of in person appointments. The patient expressed understanding and agreed to proceed. I also discussed with the patient that there may be a patient responsible charge related to this service. The patient expressed understanding and agreed to proceed.  Patient consented to consult via telephone: Yes People present and their role in pt care: Pt   History of Present Illness:  54 year old male followed in our office for moderate obstructive sleep apnea  Past medical history: Hypothyroidism, type 2 diabetes Patient of Dr. Elsworth Soho   Chief complaint:    54 year old male followed in our office for moderate obstructive sleep apnea.  Patient completing a virtual visit with our office today after a new CPAP start.  Patient received CPAP on 08/20/2019.  CPAP compliance shows adequate compliance.  Patient has only had 1 break in therapy which was a total of 4 days when his significant other was hospitalized.  CPAP compliance port as listed below:  08/09/2019-09/07/2019-15 out of last 30 days use, all 15 of those days greater than 4 hours, average usage 5 hours and 30 minutes, APAP setting 5-15, 95th percentile 9.3, AHI 0.4 >>> Started wearing CPAP on 08/20/2019 >>> 4-day break in therapy when significant other was hospitalized  Patient reports that using his CPAP it has been going well.  He is using a nasal mask.  He is also trying to not sleep on his back.  He is already noticed improved sleep quality.  He reports that he is snoring less and feels that she is more  rested.  Observations/Objective:  NPSG 08/2016 Mild oSA with AHI 4.4/h , predominnat RERAs (RDI 19/h) HST 07/2016 poor recording , AHI 3/h NPSG 04/2008 >>total sleep time 297 minutes, AHI 4/hour , 35 RERAs with RDI 11/hour    06/29/2019-Home sleep study-AHI of 14.3, SaO2 low 84%, increased apneas when in supine position  Social History   Tobacco Use  Smoking Status Never Smoker  Smokeless Tobacco Never Used   Immunization History  Administered Date(s) Administered  . Influenza Split 03/11/2012, 03/12/2013  . Influenza-Unspecified 01/20/2014, 03/01/2016, 04/22/2019  . Tdap 04/19/2016    Assessment and Plan:  Obstructive sleep apnea Adequate CPAP compliance Already noticed improvement with his breathing Moderate obstructive sleep apnea February/2021 home sleep study  Plan: Continue CPAP use Follow-up in 4 weeks to ensure we have 30 days of compliance data   Follow Up Instructions:  No follow-ups on file.    I discussed the assessment and treatment plan with the patient. The patient was provided an opportunity to ask questions and all were answered. The patient agreed with the plan and demonstrated an understanding of the instructions.   The patient was advised to call back or seek an in-person evaluation if the symptoms worsen or if the condition fails to improve as anticipated.  I provided 15 minutes of non-face-to-face time during this encounter.   Lauraine Rinne, NP

## 2019-09-08 NOTE — Patient Instructions (Addendum)
You were seen today by Lauraine Rinne, NP  for:   1. Obstructive sleep apnea  We recommend that you continue using your CPAP daily >>>Keep up the hard work using your device >>> Goal should be wearing this for the entire night that you are sleeping, at least 4 to 6 hours  Remember:  . Do not drive or operate heavy machinery if tired or drowsy.  . Please notify the supply company and office if you are unable to use your device regularly due to missing supplies or machine being broken.  . Work on maintaining a healthy weight and following your recommended nutrition plan  . Maintain proper daily exercise and movement  . Maintaining proper use of your device can also help improve management of other chronic illnesses such as: Blood pressure, blood sugars, and weight management.   BiPAP/ CPAP Cleaning:  >>>Clean weekly, with Dawn soap, and bottle brush.  Set up to air dry. >>> Wipe mask out daily with wet wipe or towelette    Follow Up:    Return in about 29 days (around 10/07/2019), or if symptoms worsen or fail to improve, for Follow up with Wyn Quaker FNP-C.   Please do your part to reduce the spread of COVID-19:      Reduce your risk of any infection  and COVID19 by using the similar precautions used for avoiding the common cold or flu:  Marland Kitchen Wash your hands often with soap and warm water for at least 20 seconds.  If soap and water are not readily available, use an alcohol-based hand sanitizer with at least 60% alcohol.  . If coughing or sneezing, cover your mouth and nose by coughing or sneezing into the elbow areas of your shirt or coat, into a tissue or into your sleeve (not your hands). Langley Gauss A MASK when in public  . Avoid shaking hands with others and consider head nods or verbal greetings only. . Avoid touching your eyes, nose, or mouth with unwashed hands.  . Avoid close contact with people who are sick. . Avoid places or events with large numbers of people in one location,  like concerts or sporting events. . If you have some symptoms but not all symptoms, continue to monitor at home and seek medical attention if your symptoms worsen. . If you are having a medical emergency, call 911.   [place] / e-Visit: [email or web address]         [facility]: [facility]: [id number]                   [facility]: [id number]     It is flu season:   >>> Best ways to protect herself from the flu: Receive the yearly flu vaccine, practice good hand hygiene washing with soap and also using hand sanitizer when available, eat a nutritious meals, get adequate rest, hydrate appropriately   Please contact the office if your symptoms worsen or you have concerns that you are not improving.   Thank you for choosing [facility] for your healthcare, and for allowing [place] to partner with you on your healthcare journey. I am thankful to be able to provide care to you today.   Wyn Quaker FNP-C

## 2019-09-08 NOTE — Assessment & Plan Note (Signed)
Adequate CPAP compliance Already noticed improvement with his breathing Moderate obstructive sleep apnea February/2021 home sleep study  Plan: Continue CPAP use Follow-up in 4 weeks to ensure we have 30 days of compliance data

## 2019-09-10 ENCOUNTER — Other Ambulatory Visit: Payer: Self-pay

## 2019-09-10 ENCOUNTER — Ambulatory Visit (INDEPENDENT_AMBULATORY_CARE_PROVIDER_SITE_OTHER): Payer: 59 | Admitting: *Deleted

## 2019-09-10 DIAGNOSIS — R7989 Other specified abnormal findings of blood chemistry: Secondary | ICD-10-CM | POA: Diagnosis not present

## 2019-09-10 MED ORDER — TESTOSTERONE CYPIONATE 200 MG/ML IM SOLN
200.0000 mg | Freq: Once | INTRAMUSCULAR | Status: AC
  Administered 2019-09-10: 16:00:00 200 mg via INTRAMUSCULAR

## 2019-09-10 NOTE — Progress Notes (Signed)
Per orders of Dr. Koberlein, injection of Testosterone cypionate 200mg given by Canary Fister A. Patient tolerated injection well.  

## 2019-09-16 ENCOUNTER — Other Ambulatory Visit: Payer: Self-pay

## 2019-09-16 ENCOUNTER — Ambulatory Visit: Payer: 59 | Admitting: Physical Medicine and Rehabilitation

## 2019-09-16 ENCOUNTER — Ambulatory Visit: Payer: Self-pay

## 2019-09-16 ENCOUNTER — Encounter: Payer: Self-pay | Admitting: Physical Medicine and Rehabilitation

## 2019-09-16 VITALS — BP 143/93 | HR 80

## 2019-09-16 DIAGNOSIS — M5116 Intervertebral disc disorders with radiculopathy, lumbar region: Secondary | ICD-10-CM

## 2019-09-16 DIAGNOSIS — M48061 Spinal stenosis, lumbar region without neurogenic claudication: Secondary | ICD-10-CM

## 2019-09-16 MED ORDER — METHYLPREDNISOLONE ACETATE 80 MG/ML IJ SUSP
40.0000 mg | Freq: Once | INTRAMUSCULAR | Status: AC
  Administered 2019-09-16: 40 mg

## 2019-09-16 NOTE — Progress Notes (Signed)
 .  Numeric Pain Rating Scale and Functional Assessment Average Pain 6   In the last MONTH (on 0-10 scale) has pain interfered with the following?  1. General activity like being  able to carry out your everyday physical activities such as walking, climbing stairs, carrying groceries, or moving a chair?  Rating(6)   +Driver, -BT, -Dye Allergies.  

## 2019-09-17 NOTE — Progress Notes (Signed)
Walter Jordan. - 54 y.o. male MRN [id number]  Date of birth: 01/18/1966  Office Visit Note: Visit Date: 09/16/2019 PCP: Caren Macadam, MD Referred by: Caren Macadam, MD  Subjective: Chief Complaint  Patient presents with  . Lower Back - Pain  . Right Leg - Pain   HPI:  Walter Grauberger. is a 54 y.o. male who comes in today For planned epidural injection at the request of Tawanna Cooler, PA-C.  He is followed by Dr. Eduard Roux from an orthopedic standpoint.  I have seen in the past for epidural injection off-and-on and he states the last one that we completed really did not help that much but that was in January 1 year ago.  His spine is somewhat complicated not from a severity standpoint but just that there is multiple areas of potential pain generators.  He has moderate multifactorial stenosis at L2-3.  He has pretty significant left-sided foraminal narrowing and lateral recess narrowing at L4-5 degree of facet arthropathy and degenerative change.  He has bilateral findings are right more than left at L5-S1 but no frank nerve compression.  No high-grade central stenosis.  No high-grade listhesis.  He likely would do well with laminectomy but is really not interested in any kind of back surgery.  I think starting with general interlaminar injection today once again just to see if it gives him any relief but I want him to call us back and we would look at potential foraminal injection at L4 or higher up at L2-3 diagnostically.  Lastly even though he did not really hear it he could be having some back pain from the facet arthropathy to go along with the leg pain from the more foraminal narrowing.  Potentially could be a spinal cord stimulator trial candidate.  ROS Otherwise per HPI.  Assessment & Plan: Visit Diagnoses:  1. Radiculopathy due to lumbar intervertebral disc disorder   2. Foraminal stenosis of lumbar region   3. Stenosis of lateral recess of lumbar spine     Plan: No  additional findings.   Meds & Orders:  Meds ordered this encounter  Medications  . methylPREDNISolone acetate (DEPO-MEDROL) injection 40 mg    Orders Placed This Encounter  Procedures  . XR C-ARM NO REPORT  . Epidural Steroid injection    Follow-up: Return if symptoms worsen or fail to improve.   Procedures: No procedures performed  Lumbar Epidural Steroid Injection - Interlaminar Approach with Fluoroscopic Guidance  Patient: Walter Kilcullen.      Date of Birth: Apr 29, 1966 MRN: [id number] PCP: Caren Macadam, MD      Visit Date: 09/16/2019   Universal Protocol:     Consent Given By: the patient  Position: PRONE  Additional Comments: Vital signs were monitored before and after the procedure. Patient was prepped and draped in the usual sterile fashion. The correct patient, procedure, and site was verified.   Injection Procedure Details:  Procedure Site One Meds Administered:  Meds ordered this encounter  Medications  . methylPREDNISolone acetate (DEPO-MEDROL) injection 40 mg     Laterality: Right  Location/Site:  L5-S1  Needle size: 20 G  Needle type: Tuohy  Needle Placement: Paramedian epidural  Findings:   -Comments: Excellent flow of contrast into the epidural space.  Procedure Details: Using a paramedian approach from the side mentioned above, the region overlying the inferior lamina was localized under fluoroscopic visualization and the soft tissues overlying this structure were infiltrated with 4  ml. of 1% Lidocaine without Epinephrine. The Tuohy needle was inserted into the epidural space using a paramedian approach.   The epidural space was localized using loss of resistance along with lateral and bi-planar fluoroscopic views.  After negative aspirate for air, blood, and CSF, a 2 ml. volume of Isovue-250 was injected into the epidural space and the flow of contrast was observed. Radiographs were obtained for documentation purposes.    The  injectate was administered into the level noted above.   Additional Comments:  The patient tolerated the procedure well Dressing: 2 x 2 sterile gauze and Band-Aid    Post-procedure details: Patient was observed during the procedure. Post-procedure instructions were reviewed.  Patient left the clinic in stable condition.     Clinical History: MRI LUMBAR SPINE WITHOUT CONTRAST  TECHNIQUE: Multiplanar, multisequence MR imaging of the lumbar spine was performed. No intravenous contrast was administered.  COMPARISON:  Previous MRI from 07/28/2014.  FINDINGS: Segmentation: Standard. Lowest well-formed disc space labeled the L5-S1 level.  Alignment: 4 mm retrolisthesis of L5 on S1, with trace retrolisthesis of L2 on L3. Trace anterolisthesis of L3 on L4. Findings chronic and facet mediated. Straightening of the normal lumbar lordosis with underlying mild dextroscoliosis.  Vertebrae: Vertebral body height maintained without evidence acute or chronic fracture. Bone marrow signal intensity somewhat heterogeneous but within normal limits. No discrete or worrisome osseous lesions. Discogenic reactive endplate changes seen about the left aspect of the L4-5 interspace and right aspect of the L5-S1 interspace. No other abnormal marrow edema.  Conus medullaris and cauda equina: Conus extends to the L1 level. Conus and cauda equina appear normal.  Paraspinal and other soft tissues: Paraspinous soft tissues demonstrate no acute finding. T2 hyperintense simple cyst partially visualize within the interpolar left kidney. Visualized visceral structures otherwise unremarkable.  Disc levels:  L1-2: Negative interspace. Mild facet hypertrophy. No significant canal or foraminal stenosis.  L2-3: Chronic intervertebral disc space narrowing with diffuse disc bulge. Superimposed mild reactive endplate changes. Moderate facet hypertrophy. Prominence of the dorsal epidural fat.  Changes resultant moderate canal with bilateral lateral recess stenosis, greater on the left. Mild bilateral L2 foraminal narrowing.  L3-4: Chronic intervertebral disc space narrowing with mild diffuse disc bulge. There is a superimposed broad right extraforaminal disc protrusion, closely approximating the exiting right L3 nerve root (series 13, image 23). Moderate bilateral facet hypertrophy. Resultant mild spinal stenosis. Mild to moderate bilateral L3 foraminal narrowing, slightly worse on the right.  L4-5: Chronic intervertebral disc space narrowing with diffuse disc bulge and disc desiccation. Disc bulging asymmetric to the left with exuberant left-sided endplate osteophytic spurring. Superimposed moderate left worse than right facet hypertrophy. Trace joint effusion on the right. Resultant severe left foraminal stenosis with probable impingement of the left L4 nerve root. Moderate to severe left lateral recess stenosis. Central canal remains patent. Mild right foraminal narrowing noted.  L5-S1: Advanced chronic intervertebral disc space narrowing with diffuse disc bulge and disc desiccation. Extensive reactive endplate changes with marginal endplate osteophytic spurring. Mild bilateral facet hypertrophy with associated trace joint effusions. Broad-based posterior disc osteophyte contacts the descending S1 nerve roots bilaterally without frank neural impingement or displacement. Moderate to severe bilateral L5 foraminal stenosis.  IMPRESSION: 1. Left eccentric disc osteophyte and facet hypertrophy at L4-5 with resultant severe left foraminal stenosis, with moderate to severe left lateral recess narrowing. Either the left L4 or descending L5 nerve roots could be affected. 2. Chronic degenerative disc osteophyte at L5-S1, contacting the descending S1 nerve  roots bilaterally without frank neural impingement or displacement. Moderate to severe bilateral L5 foraminal stenosis  at this level. 3. Right extraforaminal disc protrusion at L3-4, potentially affecting the exiting right L3 nerve root. 4. Disc bulge with facet hypertrophy at L2-3 with resultant moderate spinal stenosis. 5. Overall, these changes are progressed relative to 2016.   Electronically Signed   By: Jeannine Boga M.D.   On: 08/17/2019 08:22     Objective:  VS:  HT:    WT:   BMI:     BP:(!) 143/93  HR:80bpm  TEMP: ( )  RESP:  Physical Exam  Ortho Exam Imaging: XR C-ARM NO REPORT  Result Date: 09/16/2019 Please see Notes tab for imaging impression.

## 2019-09-17 NOTE — Procedures (Signed)
Lumbar Epidural Steroid Injection - Interlaminar Approach with Fluoroscopic Guidance  Patient: Walter Jordan.      Date of Birth: 16-Feb-1966 MRN: [id number] PCP: Caren Macadam, MD      Visit Date: 09/16/2019   Universal Protocol:     Consent Given By: the patient  Position: PRONE  Additional Comments: Vital signs were monitored before and after the procedure. Patient was prepped and draped in the usual sterile fashion. The correct patient, procedure, and site was verified.   Injection Procedure Details:  Procedure Site One Meds Administered:  Meds ordered this encounter  Medications  . methylPREDNISolone acetate (DEPO-MEDROL) injection 40 mg     Laterality: Right  Location/Site:  L5-S1  Needle size: 20 G  Needle type: Tuohy  Needle Placement: Paramedian epidural  Findings:   -Comments: Excellent flow of contrast into the epidural space.  Procedure Details: Using a paramedian approach from the side mentioned above, the region overlying the inferior lamina was localized under fluoroscopic visualization and the soft tissues overlying this structure were infiltrated with 4 ml. of 1% Lidocaine without Epinephrine. The Tuohy needle was inserted into the epidural space using a paramedian approach.   The epidural space was localized using loss of resistance along with lateral and bi-planar fluoroscopic views.  After negative aspirate for air, blood, and CSF, a 2 ml. volume of Isovue-250 was injected into the epidural space and the flow of contrast was observed. Radiographs were obtained for documentation purposes.    The injectate was administered into the level noted above.   Additional Comments:  The patient tolerated the procedure well Dressing: 2 x 2 sterile gauze and Band-Aid    Post-procedure details: Patient was observed during the procedure. Post-procedure instructions were reviewed.  Patient left the clinic in stable condition.

## 2019-10-06 NOTE — Progress Notes (Deleted)
Virtual Visit via Video Note  I connected with Walter Jordan. on 10/06/19 at 10:30 AM EDT by a video enabled telemedicine application and verified that I am speaking with the correct person using two identifiers.  Location: Patient: Home Provider: Office - [facility] - [id number], [address], [place], [place] [address]  I discussed the limitations of evaluation and management by telemedicine and the availability of in person appointments. The patient expressed understanding and agreed to proceed. I also discussed with the patient that there may be a patient responsible charge related to this service. The patient expressed understanding and agreed to proceed.  Patient consented to consult via telephone: Yes People present and their role in pt care: Pt   History of Present Illness:  54 year old male followed in our office for moderate obstructive sleep apnea  Past medical history: Hypothyroidism, type 2 diabetes Patient of Dr. Elsworth Soho  Chief complaint:    54 year old male never smoker followed in our office for mild obstructive sleep apnea.  10/07/19 - attempted to reach - left vm   Observations/Objective:  NPSG 08/2016 Mild oSA with AHI 4.4/h , predominnat RERAs (RDI 19/h) HST 07/2016 poor recording , AHI 3/h NPSG 04/2008 >>total sleep time 297 minutes, AHI 4/hour , 35 RERAs with RDI 11/hour    06/29/2019-Home sleep study-AHI of 14.3, SaO2 low 84%, increased apneas when in supine position   Social History   Tobacco Use  Smoking Status Never Smoker  Smokeless Tobacco Never Used   Immunization History  Administered Date(s) Administered  . Influenza Split 03/11/2012, 03/12/2013  . Influenza-Unspecified 01/20/2014, 03/01/2016, 04/22/2019  . Tdap 04/19/2016    Assessment and Plan:  No problem-specific Assessment & Plan notes found for this encounter.   Follow Up Instructions:  No follow-ups on file.    I discussed the assessment and treatment plan with the  patient. The patient was provided an opportunity to ask questions and all were answered. The patient agreed with the plan and demonstrated an understanding of the instructions.   The patient was advised to call back or seek an in-person evaluation if the symptoms worsen or if the condition fails to improve as anticipated.  I provided *** minutes of non-face-to-face time during this encounter.   Walter Rinne, NP

## 2019-10-07 ENCOUNTER — Telehealth: Payer: 59 | Admitting: Pulmonary Disease

## 2019-10-07 ENCOUNTER — Encounter: Payer: Self-pay | Admitting: Pulmonary Disease

## 2019-10-09 ENCOUNTER — Other Ambulatory Visit: Payer: Self-pay

## 2019-10-09 ENCOUNTER — Ambulatory Visit (INDEPENDENT_AMBULATORY_CARE_PROVIDER_SITE_OTHER): Payer: 59

## 2019-10-09 DIAGNOSIS — R7989 Other specified abnormal findings of blood chemistry: Secondary | ICD-10-CM | POA: Diagnosis not present

## 2019-10-09 MED ORDER — TESTOSTERONE CYPIONATE 100 MG/ML IM SOLN: 200.0000 mg | Freq: Once | INTRAMUSCULAR | Status: DC

## 2019-10-09 NOTE — Patient Instructions (Signed)
Health Maintenance Due  Topic Date Due  . PNEUMOCOCCAL POLYSACCHARIDE VACCINE AGE 54-64 HIGH RISK  Never done  . FOOT EXAM  Never done  . OPHTHALMOLOGY EXAM  Never done  . URINE MICROALBUMIN  Never done  . COVID-19 Vaccine (1) Never done    Depression screen [facility] 2/9 04/19/2016  Decreased Interest 0  Down, Depressed, Hopeless 0  PHQ - 2 Score 0

## 2019-10-09 NOTE — Progress Notes (Signed)
Per orders of Dr. Jerilee Hoh, injection of Testerone Cypionate given by Franco Collet. Patient tolerated injection well.

## 2019-10-21 ENCOUNTER — Other Ambulatory Visit: Payer: Self-pay | Admitting: Family Medicine

## 2019-10-21 DIAGNOSIS — G8929 Other chronic pain: Secondary | ICD-10-CM

## 2019-10-21 MED ORDER — LEVOTHYROXINE SODIUM 50 MCG PO TABS: 50.0000 ug | ORAL_TABLET | Freq: Every day | ORAL | 0 refills | Status: DC

## 2019-10-22 MED ORDER — HYDROCODONE-ACETAMINOPHEN 10-325 MG PO TABS: 1.0000 | ORAL_TABLET | Freq: Two times a day (BID) | ORAL | 0 refills | Status: DC | PRN

## 2019-10-29 ENCOUNTER — Other Ambulatory Visit: Payer: Self-pay | Admitting: Family Medicine

## 2019-11-03 ENCOUNTER — Encounter: Payer: Self-pay | Admitting: Family Medicine

## 2019-11-03 ENCOUNTER — Other Ambulatory Visit: Payer: Self-pay

## 2019-11-03 ENCOUNTER — Ambulatory Visit (INDEPENDENT_AMBULATORY_CARE_PROVIDER_SITE_OTHER): Payer: 59 | Admitting: Family Medicine

## 2019-11-03 VITALS — BP 124/76 | HR 80 | Temp 97.5°F | Resp 16 | Ht 70.0 in | Wt 229.5 lb

## 2019-11-03 DIAGNOSIS — M549 Dorsalgia, unspecified: Secondary | ICD-10-CM

## 2019-11-03 DIAGNOSIS — R1011 Right upper quadrant pain: Secondary | ICD-10-CM | POA: Diagnosis not present

## 2019-11-03 DIAGNOSIS — M5410 Radiculopathy, site unspecified: Secondary | ICD-10-CM | POA: Diagnosis not present

## 2019-11-03 MED ORDER — PREDNISONE 20 MG PO TABS: ORAL_TABLET | ORAL | 0 refills | Status: AC

## 2019-11-03 NOTE — Progress Notes (Signed)
ACUTE VISIT  Chief Complaint  Patient presents with  . Back Pain    right-sided back pain that radiates to the front that started 8 or 9 days ago   HPI: Walter Jordan. is a 54 y.o. male, who is here today with above complaint. He has hx of back pain but lower pain, he is reporting this pain as new. High lumbar and low thoracic constant, right-sided pulling like pain (5/10) radiated to right-sided abdomen + RUQ "stinging" sensation (10/10). He has not noted tingling, numbness,saddle anesthesia,bladder/bowel dysfunction,or rash. Negative for fever,chills,changes in bowel habits,N/V,or urinary symptom.  Pain is interfering with sleep. Exacerbated by certain movements. He has not identified alleviating factors.  Pain is stable.  He is on chronic opioid use for chronic pain, states that Hydrocodone-Acetaminophen 10-325 mg and Flexeril have not "touched" it.  Pain is not related with food intake.  Review of Systems  Constitutional: Positive for fatigue. Negative for appetite change.  HENT: Negative for sore throat and trouble swallowing.   Gastrointestinal: Positive for blood in stool (Reported as intermittent for a while.).  Genitourinary: Negative for decreased urine volume, dysuria and hematuria.  Musculoskeletal: Negative for gait problem and neck pain.  Neurological: Negative for syncope and weakness.  Psychiatric/Behavioral: Positive for sleep disturbance. Negative for confusion. The patient is nervous/anxious.   Rest see pertinent positives and negatives per HPI.  Current Outpatient Medications on File Prior to Visit  Medication Sig Dispense Refill  . Amino Acids (AMINO ACID PO) Take by mouth daily. Arginine po daily    . cyclobenzaprine (FLEXERIL) 10 MG tablet Take 1 tablet (10 mg total) by mouth 3 (three) times daily as needed for muscle spasms. 90 tablet 3  . HYDROcodone-acetaminophen (NORCO) 10-325 MG tablet Take 1 tablet by mouth 2 (two) times daily as  needed. 60 tablet 0  . levothyroxine (SYNTHROID) 50 MCG tablet TAKE 1 TABLET BY MOUTH DAILY BEFORE BREAKFAST 90 tablet 0  . Multiple Vitamins-Minerals (MEGA MULTIVITAMIN FOR MEN PO) Take by mouth daily.      Marland Kitchen OVER THE COUNTER MEDICATION Beta Prostate    . testosterone cypionate (DEPOTESTOSTERONE CYPIONATE) 200 MG/ML injection Inject 1 mL (200 mg total) into the muscle every 28 (twenty-eight) days. 1 mL 5   Current Facility-Administered Medications on File Prior to Visit  Medication Dose Route Frequency Provider Last Rate Last Admin  . 0.9 %  sodium chloride infusion  500 mL Intravenous Continuous Danis, Estill Cotta III, MD      . testosterone cypionate (DEPOTESTOSTERONE CYPIONATE) injection 2 mg  2 mg Intramuscular Q28 days Caren Macadam, MD   2 mg at 10/09/19 1001     Past Medical History:  Diagnosis Date  . Allergic rhinitis   . Allergy   . Arthritis    back  . ED (erectile dysfunction)   . Elevated lipase   . Enlarged prostate   . GERD (gastroesophageal reflux disease)   . Hepatic steatosis   . Hypothyroidism   . Low back pain   . Low testosterone   . Sleep apnea    had sleep study 09-10-16- needs new cpap   No Known Allergies  Social History   Socioeconomic History  . Marital status: Married    Spouse name: Not on file  . Number of children: Not on file  . Years of education: Not on file  . Highest education level: Not on file  Occupational History  . Not on file  Tobacco Use  . Smoking status: Never Smoker  . Smokeless tobacco: Never Used  Vaping Use  . Vaping Use: Never used  Substance and Sexual Activity  . Alcohol use: Yes    Comment: social  . Drug use: No  . Sexual activity: Not on file  Other Topics Concern  . Not on file  Social History Narrative  . Not on file   Social Determinants of Health   Financial Resource Strain:   . Difficulty of Paying Living Expenses:   Food Insecurity:   . Worried About Charity fundraiser in the Last Year:   .  Arboriculturist in the Last Year:   Transportation Needs:   . Film/video editor (Medical):   Marland Kitchen Lack of Transportation (Non-Medical):   Physical Activity:   . Days of Exercise per Week:   . Minutes of Exercise per Session:   Stress:   . Feeling of Stress :   Social Connections:   . Frequency of Communication with Friends and Family:   . Frequency of Social Gatherings with Friends and Family:   . Attends Religious Services:   . Active Member of Clubs or Organizations:   . Attends Archivist Meetings:   Marland Kitchen Marital Status:     Vitals:   11/03/19 1445  BP: 124/76  Pulse: 80  Resp: 16  Temp: (!) 97.5 F (36.4 C)  SpO2: 97%   Body mass index is 32.93 kg/m.  Physical Exam  Nursing note and vitals reviewed. Constitutional: He is oriented to person, place, and time. He appears well-developed. No distress.  HENT:  Head: Atraumatic.  Eyes: Conjunctivae are normal.  Cardiovascular: Normal rate and regular rhythm.  No murmur heard. Respiratory: Effort normal and breath sounds normal. No respiratory distress.  GI: Soft. He exhibits no mass. There is no hepatomegaly. There is no abdominal tenderness.  Musculoskeletal:     Thoracic back: Spasms present. No edema or deformity.     Lumbar back: Spasms and tenderness present. No edema.       Back:     Comments: No significant deformity appreciated. Mild tenderness upon palpation of paraspinal muscles. Pain elicited with movement on exam table during examination. No local edema or erythema appreciated, no suspicious lesions.  Not antalgic gait.   Neurological: He is alert and oriented to person, place, and time.  Skin: Skin is warm. No erythema.  Psychiatric: Affect normal. His mood appears anxious.  Well groomed,good eye contact.    ASSESSMENT AND PLAN:  Walter Jordan was seen today for back pain.  Diagnoses and all orders for this visit:  Mid back pain on right side I do not think imaging is needed at this  time. Continue Hydrocodone-Acetaminophen 10-325 mg and Flexeril. Continue daily activities as tolerated. F/U with PCP.  RUQ pain We discussed possible etiologies. Hx and examination do not suggest GI etiology. Today we do not have lab service, blood work can be considered next visit if appropriate. ? Radiated from back. Instructed about warning signs.  Radicular pain Side effects of Prednisone discussed, instructed to take it with breakfast. Instructed about warning signs.   -     predniSONE (DELTASONE) 20 MG tablet; 3 tabs for 3 days, 2 tabs for 3 days, 1 tabs for 3 days, and 1/2 tab for 3 days. Take tables together with breakfast.   Return in about 2 weeks (around 11/17/2019) for back pain with PCP.   Talis Iwan G. [place], MD  [facility].  [place] office.  Discharge Instructions   None    A few things to remember from today's visit:   Mid back pain on right side  RUQ pain  Radicular pain - Plan: predniSONE (DELTASONE) 20 MG tablet  Pain does not suggest gall bladder disease. Monitor for rash. Prednisone with breakfast.   Please be sure medication list is accurate. If a new problem present, please set up appointment sooner than planned today.

## 2019-11-03 NOTE — Patient Instructions (Addendum)
A few things to remember from today's visit:   Mid back pain on right side  RUQ pain  Radicular pain - Plan: predniSONE (DELTASONE) 20 MG tablet  Pain does not suggest gall bladder disease. Monitor for rash. Prednisone with breakfast.   Please be sure medication list is accurate. If a new problem present, please set up appointment sooner than planned today.

## 2019-11-04 NOTE — Progress Notes (Signed)
Left a message for pt to call the office and schedule an appt.  

## 2019-11-10 ENCOUNTER — Ambulatory Visit (INDEPENDENT_AMBULATORY_CARE_PROVIDER_SITE_OTHER): Payer: 59

## 2019-11-10 ENCOUNTER — Other Ambulatory Visit: Payer: Self-pay

## 2019-11-10 DIAGNOSIS — E291 Testicular hypofunction: Secondary | ICD-10-CM

## 2019-11-10 DIAGNOSIS — R7989 Other specified abnormal findings of blood chemistry: Secondary | ICD-10-CM

## 2019-11-10 MED ORDER — TESTOSTERONE CYPIONATE 200 MG/ML IM SOLN
200.0000 mg | INTRAMUSCULAR | Status: DC
  Administered 2019-11-10 – 2020-04-12 (×2): 200 mg via INTRAMUSCULAR

## 2019-11-10 NOTE — Progress Notes (Signed)
Per orders of Dr. Ethlyn Gallery , injection of Testosterone given by Wyvonne Lenz. Patient tolerated injection well.

## 2019-11-24 ENCOUNTER — Other Ambulatory Visit: Payer: Self-pay | Admitting: Family Medicine

## 2019-11-24 DIAGNOSIS — G8929 Other chronic pain: Secondary | ICD-10-CM

## 2019-11-26 MED ORDER — HYDROCODONE-ACETAMINOPHEN 10-325 MG PO TABS: 1.0000 | ORAL_TABLET | Freq: Two times a day (BID) | ORAL | 0 refills | Status: DC | PRN

## 2019-11-26 NOTE — Telephone Encounter (Signed)
Spoke with the pt and scheduled an appt for 7/15 to arrive at 8:15am.

## 2019-11-26 NOTE — Telephone Encounter (Signed)
Due for appointment. Cannot give further refills after one today without visit.

## 2019-12-04 ENCOUNTER — Ambulatory Visit: Payer: 59 | Admitting: Family Medicine

## 2019-12-04 ENCOUNTER — Other Ambulatory Visit: Payer: Self-pay

## 2019-12-04 ENCOUNTER — Encounter: Payer: Self-pay | Admitting: Family Medicine

## 2019-12-04 VITALS — BP 128/90 | HR 79 | Temp 98.6°F | Ht 70.0 in | Wt 227.4 lb

## 2019-12-04 DIAGNOSIS — N529 Male erectile dysfunction, unspecified: Secondary | ICD-10-CM

## 2019-12-04 DIAGNOSIS — R7302 Impaired glucose tolerance (oral): Secondary | ICD-10-CM | POA: Insufficient documentation

## 2019-12-04 DIAGNOSIS — Z23 Encounter for immunization: Secondary | ICD-10-CM

## 2019-12-04 DIAGNOSIS — E1169 Type 2 diabetes mellitus with other specified complication: Secondary | ICD-10-CM

## 2019-12-04 DIAGNOSIS — R7989 Other specified abnormal findings of blood chemistry: Secondary | ICD-10-CM | POA: Diagnosis not present

## 2019-12-04 DIAGNOSIS — E039 Hypothyroidism, unspecified: Secondary | ICD-10-CM

## 2019-12-04 DIAGNOSIS — E785 Hyperlipidemia, unspecified: Secondary | ICD-10-CM

## 2019-12-04 DIAGNOSIS — I10 Essential (primary) hypertension: Secondary | ICD-10-CM

## 2019-12-04 MED ORDER — TESTOSTERONE CYPIONATE 200 MG/ML IM SOLN: 200.0000 mg | INTRAMUSCULAR | 3 refills | Status: DC

## 2019-12-04 MED ORDER — LOSARTAN POTASSIUM 25 MG PO TABS: 25.0000 mg | ORAL_TABLET | Freq: Every day | ORAL | 1 refills | Status: DC

## 2019-12-04 NOTE — Addendum Note (Signed)
Addended by: Marrion Coy on: 12/04/2019 09:28 AM   Modules accepted: Orders

## 2019-12-04 NOTE — Progress Notes (Signed)
Walter Jordan. DOB: 1966/04/27 Encounter date: 12/04/2019  This is a 54 y.o. male who presents with Chief Complaint  Patient presents with   Follow-up    requests refill on Testosterone    History of present illness: Last visit was 1/20201 (with me).   Up and down from ortho standpoint. Sometimes through work will get up a little more - more stiffness. No sharp pain, no sciatica.   Trying to eat right; not exercising regularly but staying active. Feels like skin heals a little more slowly. Gets occasional red bump and then if scratched takes long to heal.   Energy level is decent.  DMII: diet controlled.  Low testosterone: has been doing injections. Does well with these.  Hypothyroid: still taking synthroid; doing well with this.  HTN: highest 145/91; 143/92.    No Known Allergies Current Meds  Medication Sig   Amino Acids (AMINO ACID PO) Take by mouth daily. Arginine po daily   cyclobenzaprine (FLEXERIL) 10 MG tablet Take 1 tablet (10 mg total) by mouth 3 (three) times daily as needed for muscle spasms.   HYDROcodone-acetaminophen (NORCO) 10-325 MG tablet Take 1 tablet by mouth 2 (two) times daily as needed.   levothyroxine (SYNTHROID) 50 MCG tablet TAKE 1 TABLET BY MOUTH DAILY BEFORE BREAKFAST   Multiple Vitamins-Minerals (MEGA MULTIVITAMIN FOR MEN PO) Take by mouth daily.     OVER THE COUNTER MEDICATION Beta Prostate   testosterone cypionate (DEPOTESTOSTERONE CYPIONATE) 200 MG/ML injection Inject 1 mL (200 mg total) into the muscle every 28 (twenty-eight) days.   Current Facility-Administered Medications for the 12/04/19 encounter (Office Visit) with Caren Macadam, MD  Medication   0.9 %  sodium chloride infusion   testosterone cypionate (DEPOTESTOSTERONE CYPIONATE) injection 2 mg   testosterone cypionate (DEPOTESTOSTERONE CYPIONATE) injection 200 mg    Review of Systems  Objective:  BP 128/90 (BP Location: Left Arm, Patient Position: Sitting, Cuff  Size: Large)    Pulse 79    Temp 98.6 F (37 C) (Oral)    Ht 5\' 10"  (1.778 m)    Wt 227 lb 6.4 oz (103.1 kg)    BMI 32.63 kg/m   Weight: 227 lb 6.4 oz (103.1 kg)   BP Readings from Last 3 Encounters:  12/04/19 128/90  11/03/19 124/76  09/16/19 (!) 143/93   Wt Readings from Last 3 Encounters:  12/04/19 227 lb 6.4 oz (103.1 kg)  11/03/19 229 lb 8 oz (104.1 kg)  06/16/19 230 lb 4 oz (104.4 kg)    Physical Exam Constitutional:      General: He is not in acute distress.    Appearance: He is well-developed.  Cardiovascular:     Rate and Rhythm: Normal rate and regular rhythm.     Heart sounds: Normal heart sounds. No murmur heard.  No friction rub.  Pulmonary:     Effort: Pulmonary effort is normal. No respiratory distress.     Breath sounds: Normal breath sounds. No wheezing or rales.  Musculoskeletal:     Right lower leg: No edema.     Left lower leg: No edema.  Neurological:     Mental Status: He is alert and oriented to person, place, and time.  Psychiatric:        Behavior: Behavior normal.     Assessment/Plan  1. Hypothyroidism, unspecified type Continue with synthroid - TSH; Future  2. Impaired glucose tolerance Working on healthy eating - Hemoglobin A1c; Future - Microalbumin / creatinine urine ratio; Future  3.  ERECTILE DYSFUNCTION, ORGANIC Does better with testosterone  4. Low testosterone in male See above; will recheck levels today; has next injection in a week. - PSA; Future - Testosterone; Future  5. Hypertension, unspecified type Starting losartan today to help with elevation - losartan (COZAAR) 25 MG tablet; Take 1 tablet (25 mg total) by mouth daily.  Dispense: 90 tablet; Refill: 1 - CBC with Differential/Platelet; Future - Comprehensive metabolic panel; Future  6. Hyperlipidemia associated with type 2 diabetes mellitus ([place]) - Lipid panel; Future    Return in about 3 months (around 03/05/2020) for Chronic condition visit.    Micheline Rough, MD

## 2019-12-04 NOTE — Patient Instructions (Addendum)
DASH Eating Plan DASH stands for "Dietary Approaches to Stop Hypertension." The DASH eating plan is a healthy eating plan that has been shown to reduce high blood pressure (hypertension). It may also reduce your risk for type 2 diabetes, heart disease, and stroke. The DASH eating plan may also help with weight loss. What are tips for following this plan?  General guidelines  Avoid eating more than 2,300 mg (milligrams) of salt (sodium) a day. If you have hypertension, you may need to reduce your sodium intake to 1,500 mg a day.  Limit alcohol intake to no more than 1 drink a day for nonpregnant women and 2 drinks a day for men. One drink equals 12 oz of beer, 5 oz of wine, or 1 oz of hard liquor.  Work with your health care provider to maintain a healthy body weight or to lose weight. Ask what an ideal weight is for you.  Get at least 30 minutes of exercise that causes your heart to beat faster (aerobic exercise) most days of the week. Activities may include walking, swimming, or biking.  Work with your health care provider or diet and nutrition specialist (dietitian) to adjust your eating plan to your individual calorie needs. Reading food labels   Check food labels for the amount of sodium per serving. Choose foods with less than 5 percent of the Daily Value of sodium. Generally, foods with less than 300 mg of sodium per serving fit into this eating plan.  To find whole grains, look for the word "whole" as the first word in the ingredient list. Shopping  Buy products labeled as "low-sodium" or "no salt added."  Buy fresh foods. Avoid canned foods and premade or frozen meals. Cooking  Avoid adding salt when cooking. Use salt-free seasonings or herbs instead of table salt or sea salt. Check with your health care provider or pharmacist before using salt substitutes.  Do not fry foods. Cook foods using healthy methods such as baking, boiling, grilling, and broiling instead.  Cook with  heart-healthy oils, such as olive, canola, soybean, or sunflower oil. Meal planning  Eat a balanced diet that includes: ? 5 or more servings of fruits and vegetables each day. At each meal, try to fill half of your plate with fruits and vegetables. ? Up to 6-8 servings of whole grains each day. ? Less than 6 oz of lean meat, poultry, or fish each day. A 3-oz serving of meat is about the same size as a deck of cards. One egg equals 1 oz. ? 2 servings of low-fat dairy each day. ? A serving of nuts, seeds, or beans 5 times each week. ? Heart-healthy fats. Healthy fats called Omega-3 fatty acids are found in foods such as flaxseeds and coldwater fish, like sardines, salmon, and mackerel.  Limit how much you eat of the following: ? Canned or prepackaged foods. ? Food that is high in trans fat, such as fried foods. ? Food that is high in saturated fat, such as fatty meat. ? Sweets, desserts, sugary drinks, and other foods with added sugar. ? Full-fat dairy products.  Do not salt foods before eating.  Try to eat at least 2 vegetarian meals each week.  Eat more home-cooked food and less restaurant, buffet, and fast food.  When eating at a restaurant, ask that your food be prepared with less salt or no salt, if possible. What foods are recommended? The items listed may not be a complete list. Talk with your dietitian about   what dietary choices are best for you. Grains Whole-grain or whole-wheat bread. Whole-grain or whole-wheat pasta. Brown rice. Oatmeal. Quinoa. Bulgur. Whole-grain and low-sodium cereals. Pita bread. Low-fat, low-sodium crackers. Whole-wheat flour tortillas. Vegetables Fresh or frozen vegetables (raw, steamed, roasted, or grilled). Low-sodium or reduced-sodium tomato and vegetable juice. Low-sodium or reduced-sodium tomato sauce and tomato paste. Low-sodium or reduced-sodium canned vegetables. Fruits All fresh, dried, or frozen fruit. Canned fruit in natural juice (without  added sugar). Meat and other protein foods Skinless chicken or turkey. Ground chicken or turkey. Pork with fat trimmed off. Fish and seafood. Egg whites. Dried beans, peas, or lentils. Unsalted nuts, nut butters, and seeds. Unsalted canned beans. Lean cuts of beef with fat trimmed off. Low-sodium, lean deli meat. Dairy Low-fat (1%) or fat-free (skim) milk. Fat-free, low-fat, or reduced-fat cheeses. Nonfat, low-sodium ricotta or cottage cheese. Low-fat or nonfat yogurt. Low-fat, low-sodium cheese. Fats and oils Soft margarine without trans fats. Vegetable oil. Low-fat, reduced-fat, or light mayonnaise and salad dressings (reduced-sodium). Canola, safflower, olive, soybean, and sunflower oils. Avocado. Seasoning and other foods Herbs. Spices. Seasoning mixes without salt. Unsalted popcorn and pretzels. Fat-free sweets. What foods are not recommended? The items listed may not be a complete list. Talk with your dietitian about what dietary choices are best for you. Grains Baked goods made with fat, such as croissants, muffins, or some breads. Dry pasta or rice meal packs. Vegetables Creamed or fried vegetables. Vegetables in a cheese sauce. Regular canned vegetables (not low-sodium or reduced-sodium). Regular canned tomato sauce and paste (not low-sodium or reduced-sodium). Regular tomato and vegetable juice (not low-sodium or reduced-sodium). Pickles. Olives. Fruits Canned fruit in a light or heavy syrup. Fried fruit. Fruit in cream or butter sauce. Meat and other protein foods Fatty cuts of meat. Ribs. Fried meat. Bacon. Sausage. Bologna and other processed lunch meats. Salami. Fatback. Hotdogs. Bratwurst. Salted nuts and seeds. Canned beans with added salt. Canned or smoked fish. Whole eggs or egg yolks. Chicken or turkey with skin. Dairy Whole or 2% milk, cream, and half-and-half. Whole or full-fat cream cheese. Whole-fat or sweetened yogurt. Full-fat cheese. Nondairy creamers. Whipped toppings.  Processed cheese and cheese spreads. Fats and oils Butter. Stick margarine. Lard. Shortening. Ghee. Bacon fat. Tropical oils, such as coconut, palm kernel, or palm oil. Seasoning and other foods Salted popcorn and pretzels. Onion salt, garlic salt, seasoned salt, table salt, and sea salt. Worcestershire sauce. Tartar sauce. Barbecue sauce. Teriyaki sauce. Soy sauce, including reduced-sodium. Steak sauce. Canned and packaged gravies. Fish sauce. Oyster sauce. Cocktail sauce. Horseradish that you find on the shelf. Ketchup. Mustard. Meat flavorings and tenderizers. Bouillon cubes. Hot sauce and Tabasco sauce. Premade or packaged marinades. Premade or packaged taco seasonings. Relishes. Regular salad dressings. Where to find more information:  National Heart, Lung, and Blood Institute: www.nhlbi.nih.gov  American Heart Association: www.heart.org Summary  The DASH eating plan is a healthy eating plan that has been shown to reduce high blood pressure (hypertension). It may also reduce your risk for type 2 diabetes, heart disease, and stroke.  With the DASH eating plan, you should limit salt (sodium) intake to 2,300 mg a day. If you have hypertension, you may need to reduce your sodium intake to 1,500 mg a day.  When on the DASH eating plan, aim to eat more fresh fruits and vegetables, whole grains, lean proteins, low-fat dairy, and heart-healthy fats.  Work with your health care provider or diet and nutrition specialist (dietitian) to adjust your eating plan to your   individual calorie needs. This information is not intended to replace advice given to you by your health care provider. Make sure you discuss any questions you have with your health care provider. Document Revised: 04/20/2017 Document Reviewed: 05/01/2016 Elsevier Patient Education  2020 Elsevier Inc.  

## 2019-12-04 NOTE — Addendum Note (Signed)
Addended by: Agnes Lawrence on: 12/04/2019 09:27 AM   Modules accepted: Orders

## 2019-12-05 LAB — CBC WITH DIFFERENTIAL/PLATELET
Absolute Monocytes: 429 cells/uL (ref 200–950)
Basophils Absolute: 69 cells/uL (ref 0–200)
Basophils Relative: 2.1 %
Eosinophils Absolute: 211 cells/uL (ref 15–500)
Eosinophils Relative: 6.4 %
HCT: 48.6 % (ref 38.5–50.0)
Hemoglobin: 16.1 g/dL (ref 13.2–17.1)
Lymphs Abs: 1571 cells/uL (ref 850–3900)
MCH: 31 pg (ref 27.0–33.0)
MCHC: 33.1 g/dL (ref 32.0–36.0)
MCV: 93.6 fL (ref 80.0–100.0)
MPV: 10.5 fL (ref 7.5–12.5)
Monocytes Relative: 13 %
Neutro Abs: 1020 cells/uL — ABNORMAL LOW (ref 1500–7800)
Neutrophils Relative %: 30.9 %
Platelets: 253 10*3/uL (ref 140–400)
RBC: 5.19 10*6/uL (ref 4.20–5.80)
RDW: 13.3 % (ref 11.0–15.0)
Total Lymphocyte: 47.6 %
WBC: 3.3 10*3/uL — ABNORMAL LOW (ref 3.8–10.8)

## 2019-12-05 LAB — MICROALBUMIN / CREATININE URINE RATIO
Creatinine, Urine: 145 mg/dL (ref 20–320)
Microalb Creat Ratio: 3 mcg/mg creat (ref <30)
Microalb, Ur: 0.4 mg/dL

## 2019-12-05 LAB — TSH: TSH: 1.82 mIU/L (ref 0.40–4.50)

## 2019-12-05 LAB — COMPREHENSIVE METABOLIC PANEL
AG Ratio: 1.7 (calc) (ref 1.0–2.5)
ALT: 32 U/L (ref 9–46)
AST: 23 U/L (ref 10–35)
Albumin: 4.5 g/dL (ref 3.6–5.1)
Alkaline phosphatase (APISO): 75 U/L (ref 35–144)
BUN: 18 mg/dL (ref 7–25)
CO2: 27 mmol/L (ref 20–32)
Calcium: 9.4 mg/dL (ref 8.6–10.3)
Chloride: 102 mmol/L (ref 98–110)
Creat: 1.13 mg/dL (ref 0.70–1.33)
Globulin: 2.7 g/dL (calc) (ref 1.9–3.7)
Glucose, Bld: 144 mg/dL — ABNORMAL HIGH (ref 65–99)
Potassium: 4.6 mmol/L (ref 3.5–5.3)
Sodium: 140 mmol/L (ref 135–146)
Total Bilirubin: 0.6 mg/dL (ref 0.2–1.2)
Total Protein: 7.2 g/dL (ref 6.1–8.1)

## 2019-12-05 LAB — LIPID PANEL
Cholesterol: 176 mg/dL (ref <200)
HDL: 49 mg/dL (ref >=40)
LDL Cholesterol (Calc): 108 mg/dL (calc) — ABNORMAL HIGH
Non-HDL Cholesterol (Calc): 127 mg/dL (calc) (ref <130)
Total CHOL/HDL Ratio: 3.6 (calc) (ref <5.0)
Triglycerides: 94 mg/dL (ref <150)

## 2019-12-05 LAB — HEMOGLOBIN A1C
Hgb A1c MFr Bld: 6.7 % of total Hgb — ABNORMAL HIGH (ref <5.7)
Mean Plasma Glucose: 146 (calc)
eAG (mmol/L): 8.1 (calc)

## 2019-12-05 LAB — PSA: PSA: 0.8 ng/mL (ref <=4.0)

## 2019-12-05 LAB — TESTOSTERONE: Testosterone: 274 ng/dL (ref 250–827)

## 2019-12-08 ENCOUNTER — Other Ambulatory Visit: Payer: Self-pay | Admitting: Family Medicine

## 2019-12-09 NOTE — Telephone Encounter (Signed)
Refilled once until Dr Ethlyn Gallery back.

## 2019-12-10 ENCOUNTER — Other Ambulatory Visit: Payer: Self-pay

## 2019-12-10 ENCOUNTER — Other Ambulatory Visit: Payer: Self-pay | Admitting: Family Medicine

## 2019-12-10 ENCOUNTER — Ambulatory Visit (INDEPENDENT_AMBULATORY_CARE_PROVIDER_SITE_OTHER): Payer: 59

## 2019-12-10 DIAGNOSIS — D709 Neutropenia, unspecified: Secondary | ICD-10-CM

## 2019-12-10 DIAGNOSIS — R7989 Other specified abnormal findings of blood chemistry: Secondary | ICD-10-CM

## 2019-12-10 DIAGNOSIS — E039 Hypothyroidism, unspecified: Secondary | ICD-10-CM

## 2019-12-10 MED ORDER — TESTOSTERONE CYPIONATE 200 MG/ML IM SOLN
200.0000 mg | Freq: Once | INTRAMUSCULAR | Status: AC
  Administered 2019-12-10: 200 mg via INTRAMUSCULAR

## 2019-12-10 NOTE — Progress Notes (Signed)
Pt came into the office for his testosterone injection. Injection given in the right upper outer quadrant. Pt tolerated injection well.

## 2019-12-22 NOTE — Addendum Note (Signed)
Addended by: Marrion Coy on: 12/22/2019 03:28 PM   Modules accepted: Orders

## 2019-12-30 ENCOUNTER — Other Ambulatory Visit: Payer: Self-pay | Admitting: Family Medicine

## 2019-12-30 DIAGNOSIS — G8929 Other chronic pain: Secondary | ICD-10-CM

## 2019-12-31 MED ORDER — HYDROCODONE-ACETAMINOPHEN 10-325 MG PO TABS: 1.0000 | ORAL_TABLET | Freq: Two times a day (BID) | ORAL | 0 refills | Status: DC | PRN

## 2020-01-08 ENCOUNTER — Ambulatory Visit (INDEPENDENT_AMBULATORY_CARE_PROVIDER_SITE_OTHER): Payer: 59 | Admitting: *Deleted

## 2020-01-08 ENCOUNTER — Other Ambulatory Visit: Payer: Self-pay | Admitting: Family Medicine

## 2020-01-08 ENCOUNTER — Other Ambulatory Visit: Payer: Self-pay

## 2020-01-08 ENCOUNTER — Other Ambulatory Visit: Payer: 59

## 2020-01-08 DIAGNOSIS — D709 Neutropenia, unspecified: Secondary | ICD-10-CM

## 2020-01-08 DIAGNOSIS — R7989 Other specified abnormal findings of blood chemistry: Secondary | ICD-10-CM | POA: Diagnosis not present

## 2020-01-08 LAB — CBC WITH DIFFERENTIAL/PLATELET
Absolute Monocytes: 391 cells/uL (ref 200–950)
Basophils Absolute: 61 cells/uL (ref 0–200)
Basophils Relative: 1.8 %
Eosinophils Absolute: 122 cells/uL (ref 15–500)
Eosinophils Relative: 3.6 %
HCT: 45.6 % (ref 38.5–50.0)
Hemoglobin: 15.4 g/dL (ref 13.2–17.1)
Lymphs Abs: 1649 cells/uL (ref 850–3900)
MCH: 32 pg (ref 27.0–33.0)
MCHC: 33.8 g/dL (ref 32.0–36.0)
MCV: 94.6 fL (ref 80.0–100.0)
MPV: 10.8 fL (ref 7.5–12.5)
Monocytes Relative: 11.5 %
Neutro Abs: 1176 cells/uL — ABNORMAL LOW (ref 1500–7800)
Neutrophils Relative %: 34.6 %
Platelets: 261 10*3/uL (ref 140–400)
RBC: 4.82 10*6/uL (ref 4.20–5.80)
RDW: 12.3 % (ref 11.0–15.0)
Total Lymphocyte: 48.5 %
WBC: 3.4 10*3/uL — ABNORMAL LOW (ref 3.8–10.8)

## 2020-01-08 MED ORDER — TESTOSTERONE CYPIONATE 200 MG/ML IM SOLN
200.0000 mg | Freq: Once | INTRAMUSCULAR | Status: AC
  Administered 2020-01-08: 200 mg via INTRAMUSCULAR

## 2020-01-08 NOTE — Progress Notes (Signed)
Per orders of Dr. Jerilee Hoh, injection of Testosterone cypionate injection 200mg  given by Agnes Lawrence. Patient tolerated injection well.

## 2020-01-09 ENCOUNTER — Ambulatory Visit: Payer: 59

## 2020-01-09 ENCOUNTER — Other Ambulatory Visit: Payer: 59

## 2020-01-14 ENCOUNTER — Other Ambulatory Visit: Payer: Self-pay | Admitting: Family Medicine

## 2020-02-06 ENCOUNTER — Other Ambulatory Visit: Payer: Self-pay

## 2020-02-06 ENCOUNTER — Ambulatory Visit (INDEPENDENT_AMBULATORY_CARE_PROVIDER_SITE_OTHER): Payer: 59 | Admitting: *Deleted

## 2020-02-06 ENCOUNTER — Other Ambulatory Visit: Payer: 59

## 2020-02-06 ENCOUNTER — Telehealth: Payer: Self-pay | Admitting: *Deleted

## 2020-02-06 DIAGNOSIS — R7989 Other specified abnormal findings of blood chemistry: Secondary | ICD-10-CM

## 2020-02-06 DIAGNOSIS — D709 Neutropenia, unspecified: Secondary | ICD-10-CM

## 2020-02-06 MED ORDER — TESTOSTERONE CYPIONATE 200 MG/ML IM SOLN
200.0000 mg | Freq: Once | INTRAMUSCULAR | Status: AC
  Administered 2020-02-06: 200 mg via INTRAMUSCULAR

## 2020-02-06 NOTE — Progress Notes (Addendum)
Per orders of Dr. Elease Hashimoto, injection of Testosterone cypionate 200mg  given by Agnes Lawrence. Patient tolerated injection well.  Medication supplied by the patient.

## 2020-02-06 NOTE — Telephone Encounter (Signed)
Patient came in today for as a nurse visit for an injection and requested a refill on Testerosterone and Hydrocodone be sent to [facility] on [address].  Message sent to PCP.

## 2020-02-09 ENCOUNTER — Other Ambulatory Visit: Payer: Self-pay | Admitting: Family Medicine

## 2020-02-09 DIAGNOSIS — M25569 Pain in unspecified knee: Secondary | ICD-10-CM

## 2020-02-09 LAB — CBC (INCLUDES DIFF/PLT) WITH PATHOLOGIST REVIEW
Absolute Monocytes: 444 cells/uL (ref 200–950)
Basophils Absolute: 72 cells/uL (ref 0–200)
Basophils Relative: 1.8 %
Eosinophils Absolute: 100 cells/uL (ref 15–500)
Eosinophils Relative: 2.5 %
HCT: 46.1 % (ref 38.5–50.0)
Hemoglobin: 15.4 g/dL (ref 13.2–17.1)
Lymphs Abs: 1988 cells/uL (ref 850–3900)
MCH: 31.9 pg (ref 27.0–33.0)
MCHC: 33.4 g/dL (ref 32.0–36.0)
MCV: 95.4 fL (ref 80.0–100.0)
MPV: 10.7 fL (ref 7.5–12.5)
Monocytes Relative: 11.1 %
Neutro Abs: 1396 cells/uL — ABNORMAL LOW (ref 1500–7800)
Neutrophils Relative %: 34.9 %
Platelets: 251 10*3/uL (ref 140–400)
RBC: 4.83 10*6/uL (ref 4.20–5.80)
RDW: 12.5 % (ref 11.0–15.0)
Total Lymphocyte: 49.7 %
WBC: 4 10*3/uL (ref 3.8–10.8)

## 2020-02-09 MED ORDER — TESTOSTERONE CYPIONATE 200 MG/ML IM SOLN
200.0000 mg | INTRAMUSCULAR | 2 refills | Status: DC
Start: 2020-02-09 — End: 2020-06-14

## 2020-02-09 MED ORDER — HYDROCODONE-ACETAMINOPHEN 10-325 MG PO TABS: 1.0000 | ORAL_TABLET | Freq: Two times a day (BID) | ORAL | 0 refills | Status: DC | PRN

## 2020-02-09 NOTE — Telephone Encounter (Signed)
done

## 2020-03-01 ENCOUNTER — Other Ambulatory Visit: Payer: Self-pay | Admitting: Family Medicine

## 2020-03-12 ENCOUNTER — Other Ambulatory Visit: Payer: Self-pay

## 2020-03-12 ENCOUNTER — Encounter: Payer: Self-pay | Admitting: Family Medicine

## 2020-03-12 ENCOUNTER — Ambulatory Visit: Payer: 59 | Admitting: Family Medicine

## 2020-03-12 VITALS — BP 102/80 | HR 88 | Temp 98.4°F | Ht 70.0 in | Wt 227.4 lb

## 2020-03-12 DIAGNOSIS — Z23 Encounter for immunization: Secondary | ICD-10-CM

## 2020-03-12 DIAGNOSIS — E119 Type 2 diabetes mellitus without complications: Secondary | ICD-10-CM | POA: Diagnosis not present

## 2020-03-12 DIAGNOSIS — M545 Low back pain, unspecified: Secondary | ICD-10-CM

## 2020-03-12 DIAGNOSIS — R7989 Other specified abnormal findings of blood chemistry: Secondary | ICD-10-CM

## 2020-03-12 DIAGNOSIS — G4733 Obstructive sleep apnea (adult) (pediatric): Secondary | ICD-10-CM | POA: Diagnosis not present

## 2020-03-12 DIAGNOSIS — E039 Hypothyroidism, unspecified: Secondary | ICD-10-CM

## 2020-03-12 DIAGNOSIS — M25569 Pain in unspecified knee: Secondary | ICD-10-CM

## 2020-03-12 DIAGNOSIS — G8929 Other chronic pain: Secondary | ICD-10-CM

## 2020-03-12 MED ORDER — TESTOSTERONE CYPIONATE 200 MG/ML IM SOLN
200.0000 mg | Freq: Once | INTRAMUSCULAR | Status: AC
  Administered 2020-03-12: 200 mg via INTRAMUSCULAR

## 2020-03-12 MED ORDER — HYDROCODONE-ACETAMINOPHEN 10-325 MG PO TABS: 1.0000 | ORAL_TABLET | Freq: Two times a day (BID) | ORAL | 0 refills | Status: DC | PRN

## 2020-03-12 NOTE — Progress Notes (Signed)
Walter Jordan. DOB: 1965/06/07 Encounter date: 03/12/2020  This is a 54 y.o. male who presents with Chief Complaint  Patient presents with   Follow-up    History of present illness: At last visit blood pressure was slightly elevated, losartan started to help with this as well as provide renal protection. No dizziness, light headedness.   Back pain that gets bad; still right side that bothers him. Can't sleep on right side at all. Takes some time to get moving after being inactive. Shot he got from Dr. Ernestina Patches did not help. First thing in morning he takes thyroid medication, then at work eats breakfast, takes coffee. Takes pain medication, flexeril which helps him through day and then able to tolerate pain. Feels that pain gets to 5/10; unusual that he needs to take another pain medication at night.  He does get radiation of pain down both legs.  Right leg seems to be worse than the left.  Sometimes pain is only into the right buttock, but commonly pain will radiate all the way down the right leg and he can have numbness of his right foot.  Usually pain with radiation on the left goes only down to the knee.  He does get some pain bilateral /lateral calves.  He does not want to have surgery, but would like to meet with a surgeon to discuss what options they feel may be helpful for him, and what to expect/plan for down the road.  Saw urologist and they suggested teaching self injection are doing more frequent lower dose injections to keep levels better maintained.  No Known Allergies Current Meds  Medication Sig   alfuzosin (UROXATRAL) 10 MG 24 hr tablet Take 10 mg by mouth daily.   Amino Acids (AMINO ACID PO) Take by mouth daily. Arginine po daily   cyclobenzaprine (FLEXERIL) 10 MG tablet TAKE 1 TABLET BY MOUTH THREE TIMES A DAY AS NEEDED FOR MUSCLE SPASMS   HYDROcodone-acetaminophen (NORCO) 10-325 MG tablet Take 1 tablet by mouth 2 (two) times daily as needed.   levothyroxine  (SYNTHROID) 50 MCG tablet TAKE 1 TABLET BY MOUTH DAILY BEFORE BREAKFAST   losartan (COZAAR) 25 MG tablet Take 1 tablet (25 mg total) by mouth daily.   Multiple Vitamins-Minerals (MEGA MULTIVITAMIN FOR MEN PO) Take by mouth daily.     OVER THE COUNTER MEDICATION Beta Prostate   testosterone cypionate (DEPOTESTOSTERONE CYPIONATE) 200 MG/ML injection Inject 1 mL (200 mg total) into the muscle every 28 (twenty-eight) days.   Current Facility-Administered Medications for the 03/12/20 encounter (Office Visit) with Caren Macadam, MD  Medication   0.9 %  sodium chloride infusion   testosterone cypionate (DEPOTESTOSTERONE CYPIONATE) injection 2 mg   testosterone cypionate (DEPOTESTOSTERONE CYPIONATE) injection 200 mg    Review of Systems  Constitutional: Negative for chills, fatigue and fever.  Respiratory: Negative for cough, chest tightness, shortness of breath and wheezing.   Cardiovascular: Negative for chest pain, palpitations and leg swelling.  Musculoskeletal: Positive for back pain.  Neurological: Positive for numbness.    Objective:  BP 102/80 (BP Location: Left Arm, Patient Position: Sitting, Cuff Size: Large)    Pulse 88    Temp 98.4 F (36.9 C) (Oral)    Ht 5\' 10"  (1.778 m)    Wt 227 lb 6.4 oz (103.1 kg)    SpO2 96%    BMI 32.63 kg/m   Weight: 227 lb 6.4 oz (103.1 kg)   BP Readings from Last 3 Encounters:  03/12/20 102/80  12/04/19 128/90  11/03/19 124/76   Wt Readings from Last 3 Encounters:  03/12/20 227 lb 6.4 oz (103.1 kg)  12/04/19 227 lb 6.4 oz (103.1 kg)  11/03/19 229 lb 8 oz (104.1 kg)    Physical Exam Constitutional:      General: He is not in acute distress.    Appearance: He is well-developed.  Cardiovascular:     Rate and Rhythm: Normal rate and regular rhythm.     Heart sounds: Normal heart sounds. No murmur heard.  No friction rub.  Pulmonary:     Effort: Pulmonary effort is normal. No respiratory distress.     Breath sounds: Normal breath  sounds. No wheezing or rales.  Musculoskeletal:     Right lower leg: No edema.     Left lower leg: No edema.  Neurological:     Mental Status: He is alert and oriented to person, place, and time.  Psychiatric:        Behavior: Behavior normal.     Assessment/Plan    1. Controlled type 2 diabetes mellitus without complication, without long-term current use of insulin (HCC) A1c has been decently controlled with A1c in 11/2019 at 6.7.  He is on losartan for renal protection.  His diabetes is currently diet controlled.  We will plan to recheck A1c in 4 months time to ensure stability. - Hemoglobin A1c; Future - Microalbumin / creatinine urine ratio; Future  2. Hypothyroidism, unspecified type Thyroid has been stable on 50 mcg daily of levothyroxine.  Continue current dose. - TSH; Future  3. Obstructive sleep apnea He is followed by [facility] and has good compliance with CPAP. - CBC with Differential/Platelet; Future - Comprehensive metabolic panel; Future  4. Lumbar back pain He does have significant lumbar disc disease.  I think it is worthwhile for him to talk with neurosurgery about pros and cons of intervention.  Currently though, he is able to tolerate his pain with minimal amounts of pain medication (usually 1 tablet daily). - Ambulatory referral to Neurosurgery - Pain Mgmt, Profile 2 w/o Conf, U; Future   6. Low testosterone in male We are going to try to transition him to home injections.  His insurance did not cover other topical or auto injecting forms of testosterone at this time.  Today was the teaching lesson for the testosterone injection.  When he comes next this will be reviewed again with him.  Once he feels comfortable, we can consider switching home dosing to a lower dose/more frequent dosing regimen to keep levels more stable. - Testosterone; Future - PSA; Future   Return in about 4 months (around 07/13/2020) for Chronic condition visit with bloodwork prior  (also nurse visit in 1 mo for testosterone injection).    Micheline Rough, MD

## 2020-03-23 ENCOUNTER — Ambulatory Visit: Payer: 59 | Admitting: Physical Therapy

## 2020-03-25 ENCOUNTER — Other Ambulatory Visit: Payer: Self-pay

## 2020-03-25 ENCOUNTER — Encounter: Payer: Self-pay | Admitting: Physical Therapy

## 2020-03-25 ENCOUNTER — Ambulatory Visit: Payer: 59 | Attending: Neurosurgery | Admitting: Physical Therapy

## 2020-03-25 DIAGNOSIS — M5416 Radiculopathy, lumbar region: Secondary | ICD-10-CM | POA: Diagnosis not present

## 2020-03-25 NOTE — Therapy (Signed)
[place], [place], [address] Phone: [phone]   Fax:  [phone]  Physical Therapy Evaluation  Patient Details  Name: Walter Jordan. MRN: [id number] Date of Birth: 1965-07-09 Referring Provider (PT): Duffy Rhody, MD   Encounter Date: 03/25/2020   PT End of Session - 03/25/20 1439    Visit Number 1    Number of Visits 12    Date for PT Re-Evaluation 05/06/20    Authorization Type [employer], recheck FOTO by visit 6    PT Start Time 1340   pt. arrived late   PT Stop Time 1422    PT Time Calculation (min) 42 min    Activity Tolerance Patient tolerated treatment well    Behavior During Therapy [facility] for tasks assessed/performed           Past Medical History:  Diagnosis Date   Allergic rhinitis    Allergy    Arthritis    back   ED (erectile dysfunction)    Elevated lipase    Enlarged prostate    GERD (gastroesophageal reflux disease)    Hepatic steatosis    Hypothyroidism    Low back pain    Low testosterone    Sleep apnea    had sleep study 09-10-16- needs new cpap    Past Surgical History:  Procedure Laterality Date   COLONOSCOPY  03/19/2008; 2018   ext. hems    HERNIA REPAIR     x2 1968, inguinal [place] EXTRACTION      There were no vitals filed for this visit.    Subjective Assessment - 03/25/20 1343    Subjective Pt. is a 54 y/o male referred to PT for c/o chronic LBP with right>left LE radiating pain. He has multi-year history of chronic symptoms which became exacerbated around early [place] specific mechanism of injury noted. Current symptoms include LBP with radiating pain down right LE/lateral leg distal to ankle region. Intermittently he reports some left leg radiating symptoms as well. Pt. had MRI last March which per report showed right-sided disc protrusion at L3-4 potentially affecting the L3 nerve root as well as moderate to severa bilateral  foraminal stenosis at L5. No bowel or bladder changes noted. Symptoms are exacerbated primarily with standing and walking, pain is constant but worse standing/walking than sitting.    Pertinent History chronic LBP history    Limitations Standing;Lifting;House hold activities;Walking    Diagnostic tests MRI, X-rays    Patient Stated Goals Resolve back and leg pain    Currently in Pain? Yes    Pain Score 3     Pain Location Back    Pain Orientation Right;Lower    Pain Descriptors / Indicators Dull    Pain Type Chronic pain    Pain Radiating Towards right leg distal to lateral ankle region    Pain Onset More than a month ago    Pain Frequency Constant    Aggravating Factors  lying on right side, initial gait after sit>stand, prolonged standing and walking    Pain Relieving Factors medication    Effect of Pain on Daily Activities limits positional and activity tolerance              [facility] PT Assessment - 03/25/20 0001      Assessment   Medical Diagnosis Spinal stenosis without neurogenic claudication    Referring Provider (PT) Duffy Rhody, MD    Onset Date/Surgical Date --   chronic history  exacerbated around 01/25/20   Prior Therapy past PT around 15-20 years ago      Precautions   Precautions None      Restrictions   Weight Bearing Restrictions No      Balance Screen   Has the patient fallen in the past 6 months No      Prior Function   Level of Independence Independent with basic ADLs;Independent with community mobility without device      Cognition   Overall Cognitive Status Within Functional Limits for tasks assessed      Observation/Other Assessments   Focus on Therapeutic Outcomes (FOTO)  33% limited      Sensation   Light Touch --   decreased at L4-5 dermatomes on right     Deep Tendon Reflexes   DTR Assessment Site Patella;Achilles    Patella DTR 1+    Achilles DTR 2+      ROM / Strength   AROM / PROM / Strength AROM;Strength      AROM   Overall  AROM Comments stiffness limiting end-ranges of hip ROM bilaterally but hip AROM grossly WFL for bed mobility and transfers, no directional preference noted with lumbar AROM with flexion/overpressure or repeated extension as well as trial prone on elbows    AROM Assessment Site Lumbar    Lumbar Flexion 80    Lumbar Extension 15    Lumbar - Right Side Bend 24    Lumbar - Left Side Bend 25    Lumbar - Right Rotation WFL    Lumbar - Left Rotation [facility]      Strength   Strength Assessment Site Hip;Knee;Ankle    Right/Left Hip Right;Left    Right Hip Flexion 5/5    Right Hip Extension 4/5    Right Hip External Rotation  5/5    Right Hip Internal Rotation 5/5   increased right lateral hip pain   Right Hip ABduction 4+/5    Left Hip Flexion 5/5    Left Hip Extension 4+/5    Left Hip External Rotation 5/5    Left Hip Internal Rotation 5/5    Left Hip ABduction 4+/5    Right/Left Knee Right;Left    Right Knee Flexion 5/5    Right Knee Extension 5/5    Left Knee Flexion 5/5    Left Knee Extension 5/5    Right/Left Ankle Right;Left    Right Ankle Dorsiflexion 4+/5    Right Ankle Inversion 5/5    Right Ankle Eversion 4+/5    Left Ankle Dorsiflexion 5/5    Left Ankle Inversion 5/5    Left Ankle Eversion 5/5      Flexibility   Soft Tissue Assessment /Muscle Length --   hamstring and piriformis tightness bilat.     Palpation   SI assessment  (+) FABER for posterior hip pain otherwise SI compression, distraction, thigh thrust and Gaensaln's (-)    Palpation comment no significant findings noted      Special Tests   Other special tests SLR (+) on right                      Objective measurements completed on examination: See above findings.       [facility] Adult PT Treatment/Exercise - 03/25/20 0001      Exercises   Exercises --   HEP handout review                 PT Education - 03/25/20 1439  Education Details spine anatomy, symptom etiology, HEP     Person(s) Educated Patient    Methods Explanation;Verbal cues;Handout    Comprehension Verbalized understanding               PT Long Term Goals - 03/25/20 1448      PT LONG TERM GOAL #1   Title Independent with HEP    Baseline needs HEP    Time 6    Period Weeks    Status New    Target Date 05/06/20      PT LONG TERM GOAL #2   Title Improve FOTO outcome measure to 29% or less impairment    Baseline 33% limited    Time 6    Period Weeks    Status New    Target Date 05/06/20      PT LONG TERM GOAL #3   Title Tolerate standing and walking periods at least 30-40 min for chores, shopping with LBP/leg pain decreased at least 40% from baseline status    Baseline 2-3/10 pain at eval but higher pain level with standing and walking    Time 6    Period Weeks    Status New    Target Date 05/06/20      PT LONG TERM GOAL #4   Title Increase bilateral hip abduction and extension strength at least 1/2 MMT grade to improve lumbopelvis stability and ability for lifting activities    Baseline hip abd 4+/5 bilat., hip extension 4/5 right, 4+/5 left    Time 6    Period Weeks    Status New    Target Date 05/06/20      PT LONG TERM GOAL #5   Title Tolerate right sidelying positioning for sleep    Baseline limited tolerance due to pain    Time 6    Period Weeks    Status New    Target Date 05/06/20                  Plan - 03/25/20 1440    Clinical Impression Statement Pt. presents with LBP with right>left LE radicular pain. (+) SLR could correlate with MRI findings of disc bulge. No significant directional preference noted with lumbar ROM assessment and suspect symptoms could be multifactorial with both disc bulge and stenosis. Given lack of pain with bending/flexion motions and sitting and that concordant symptoms reproduced with standing and walking would suspect stenosis as symptomatic so plan trial flexion bias ROM to address and will monitor further response. Plan trial  PT to see if symptoms and associated functional limitations can be improved with conservative tx. efforts.    Personal Factors and Comorbidities Time since onset of injury/illness/exacerbation;Other   MRI findings with both disc bulge and stenosis   Examination-Activity Limitations Stand;Sleep;Transfers;Locomotion Level;Lift    Examination-Participation Restrictions Community Activity;Shop    Stability/Clinical Decision Making Evolving/Moderate complexity    Clinical Decision Making Moderate    Rehab Potential Fair    PT Frequency --   1-2x/week   PT Duration 6 weeks    PT Treatment/Interventions Traction;Cryotherapy;Electrical Stimulation;Moist Heat;ADLs/Self Care Home Management;Therapeutic exercise;Neuromuscular re-education;Therapeutic activities;Manual techniques;Patient/family education;Dry needling;Taping    PT Next Visit Plan Check response to trial flexion bias ROM for LE radiating symptoms and continue/progress flexion bias ROM and core strengthening/stabilization as tolerated, plan trial lumbar mechanical traction as well, right dural nerve glides/floss    PT Home Exercise Plan Access code: [email or web address]    Consulted and Agree with Plan of Care Patient  Patient will benefit from skilled therapeutic intervention in order to improve the following deficits and impairments:  Pain, Impaired flexibility, Decreased strength, Decreased activity tolerance, Decreased range of motion, Impaired sensation, Difficulty walking  Visit Diagnosis: Radiculopathy, lumbar region     Problem List Patient Active Problem List   Diagnosis Date Noted   Impaired glucose tolerance 12/04/2019   Low testosterone in male 06/11/2019   Controlled type 2 diabetes mellitus without complication, without long-term current use of insulin ([place]) 06/11/2019   Pain in left hand 03/28/2019   Hx of adenomatous colonic polyps 10/25/2017   Lumbar radiculopathy 05/11/2016   VERTIGO 02/22/2010    Obstructive sleep apnea 09/27/2009   ERECTILE DYSFUNCTION, ORGANIC 09/07/2009   CHEST PAIN 03/13/2008   OTHER SYMPTOMS INVOLVING DIGESTIVE SYSTEM OTHER 03/12/2008   RECTAL BLEEDING, HX OF 03/12/2008   Hypothyroidism 02/22/2007   Allergic rhinitis 02/22/2007   GERD 02/22/2007   LOW BACK PAIN 02/22/2007    Beaulah Dinning, PT, DPT 03/25/20 2:52 PM  [facility] [address] [place], [place], [address] Phone: [phone]   Fax:  [phone]  Name: Merton Wadlow. MRN: [id number] Date of Birth: 12/05/1965

## 2020-04-12 ENCOUNTER — Other Ambulatory Visit: Payer: Self-pay

## 2020-04-12 ENCOUNTER — Ambulatory Visit (INDEPENDENT_AMBULATORY_CARE_PROVIDER_SITE_OTHER): Payer: 59 | Admitting: *Deleted

## 2020-04-12 DIAGNOSIS — R7989 Other specified abnormal findings of blood chemistry: Secondary | ICD-10-CM | POA: Diagnosis not present

## 2020-04-12 MED ORDER — TESTOSTERONE CYPIONATE 200 MG/ML IM SOLN: 100.0000 mg | Freq: Once | INTRAMUSCULAR | Status: DC

## 2020-04-12 NOTE — Progress Notes (Signed)
Per orders of Dr. Ethlyn Gallery, injection of Testoserone given by Westley Hummer. Patient tolerated injection well.

## 2020-05-07 ENCOUNTER — Other Ambulatory Visit: Payer: Self-pay | Admitting: Family Medicine

## 2020-05-07 DIAGNOSIS — G8929 Other chronic pain: Secondary | ICD-10-CM

## 2020-05-10 MED ORDER — HYDROCODONE-ACETAMINOPHEN 10-325 MG PO TABS: 1.0000 | ORAL_TABLET | Freq: Two times a day (BID) | ORAL | 0 refills | Status: DC | PRN

## 2020-05-12 ENCOUNTER — Ambulatory Visit (INDEPENDENT_AMBULATORY_CARE_PROVIDER_SITE_OTHER): Payer: 59 | Admitting: *Deleted

## 2020-05-12 ENCOUNTER — Other Ambulatory Visit: Payer: Self-pay

## 2020-05-12 DIAGNOSIS — R7989 Other specified abnormal findings of blood chemistry: Secondary | ICD-10-CM | POA: Diagnosis not present

## 2020-05-12 MED ORDER — TESTOSTERONE CYPIONATE 200 MG/ML IM SOLN
200.0000 mg | Freq: Once | INTRAMUSCULAR | Status: AC
  Administered 2020-05-12: 15:00:00 200 mg via INTRAMUSCULAR

## 2020-05-12 NOTE — Progress Notes (Signed)
Per orders of Dr. Koberlein, injection of Testosterone cypionate 200mg given by Merelin Human A. Patient tolerated injection well.  

## 2020-05-26 ENCOUNTER — Other Ambulatory Visit: Payer: Self-pay | Admitting: Family Medicine

## 2020-05-26 DIAGNOSIS — I10 Essential (primary) hypertension: Secondary | ICD-10-CM

## 2020-05-31 ENCOUNTER — Other Ambulatory Visit: Payer: Self-pay | Admitting: Family Medicine

## 2020-06-03 ENCOUNTER — Ambulatory Visit: Payer: 59 | Attending: Internal Medicine

## 2020-06-03 DIAGNOSIS — Z23 Encounter for immunization: Secondary | ICD-10-CM

## 2020-06-03 NOTE — Progress Notes (Signed)
   [id number]  Name:  Walter Jordan.    MRN: [id number] DOB: [birth year]  06/03/2020  [name] was observed post Covid-19 immunization for 15 minutes without incident. He was provided with Vaccine Information Sheet and instruction to access the V-Safe system.   [name] was instructed to call 911 with any severe reactions post vaccine: Marland Kitchen Difficulty breathing  . Swelling of face and throat  . A fast heartbeat  . A bad rash all over body  . Dizziness and weakness   Immunizations Administered    Name Date Dose VIS Date Route   Pfizer COVID-19 Vaccine 06/03/2020  2:30 PM 0.3 mL 03/10/2020 Intramuscular   Manufacturer: [place]   Lot: [id number]   [place]: [id number]

## 2020-06-03 NOTE — Therapy (Signed)
[place], [place], [address] Phone: [phone]   Fax:  [phone]  Physical Therapy Evaluation/Discharge  Patient Details  Name: Walter Jordan. MRN: [id number] Date of Birth: 04/16/1966 Referring Provider (PT): Duffy Rhody, MD   Encounter Date: 03/25/2020    Past Medical History:  Diagnosis Date  . Allergic rhinitis   . Allergy   . Arthritis    back  . ED (erectile dysfunction)   . Elevated lipase   . Enlarged prostate   . GERD (gastroesophageal reflux disease)   . Hepatic steatosis   . Hypothyroidism   . Low back pain   . Low testosterone   . Sleep apnea    had sleep study 09-10-16- needs new cpap    Past Surgical History:  Procedure Laterality Date  . COLONOSCOPY  03/19/2008; 2018   ext. hems   . HERNIA REPAIR     x2 1968, inguinal 1973  . WISDOM TOOTH EXTRACTION      There were no vitals filed for this visit.                    Objective measurements completed on examination: See above findings.                    PT Long Term Goals - 03/25/20 1448      PT LONG TERM GOAL #1   Title Independent with HEP    Baseline needs HEP    Time 6    Period Weeks    Status New    Target Date 05/06/20      PT LONG TERM GOAL #2   Title Improve FOTO outcome measure to 29% or less impairment    Baseline 33% limited    Time 6    Period Weeks    Status New    Target Date 05/06/20      PT LONG TERM GOAL #3   Title Tolerate standing and walking periods at least 30-40 min for chores, shopping with LBP/leg pain decreased at least 40% from baseline status    Baseline 2-3/10 pain at eval but higher pain level with standing and walking    Time 6    Period Weeks    Status New    Target Date 05/06/20      PT LONG TERM GOAL #4   Title Increase bilateral hip abduction and extension strength at least 1/2 MMT grade to improve lumbopelvis stability and ability  for lifting activities    Baseline hip abd 4+/5 bilat., hip extension 4/5 right, 4+/5 left    Time 6    Period Weeks    Status New    Target Date 05/06/20      PT LONG TERM GOAL #5   Title Tolerate right sidelying positioning for sleep    Baseline limited tolerance due to pain    Time 6    Period Weeks    Status New    Target Date 05/06/20                   Patient will benefit from skilled therapeutic intervention in order to improve the following deficits and impairments:  Pain,Impaired flexibility,Decreased strength,Decreased activity tolerance,Decreased range of motion,Impaired sensation,Difficulty walking  Visit Diagnosis: Radiculopathy, lumbar region - Plan: PT plan of care cert/re-cert     Problem List Patient Active Problem List   Diagnosis Date Noted  . Impaired glucose tolerance 12/04/2019  .  Low testosterone in male 06/11/2019  . Controlled type 2 diabetes mellitus without complication, without long-term current use of insulin ([place]) 06/11/2019  . Pain in left hand 03/28/2019  . Hx of adenomatous colonic polyps 10/25/2017  . Lumbar radiculopathy 05/11/2016  . VERTIGO 02/22/2010  . Obstructive sleep apnea 09/27/2009  . ERECTILE DYSFUNCTION, ORGANIC 09/07/2009  . CHEST PAIN 03/13/2008  . OTHER SYMPTOMS INVOLVING DIGESTIVE SYSTEM OTHER 03/12/2008  . RECTAL BLEEDING, HX OF 03/12/2008  . Hypothyroidism 02/22/2007  . Allergic rhinitis 02/22/2007  . GERD 02/22/2007  . LOW BACK PAIN 02/22/2007       PHYSICAL THERAPY DISCHARGE SUMMARY  Visits from Start of Care: 1  Current functional level related to goals / functional outcomes: Patient did not return for further therapy visits after initial evaluation.   Remaining deficits: Status unknown/did not return   Education / Equipment: HEP Plan: Patient agrees to discharge.  Patient goals were not met. Patient is being discharged due to not returning since the last visit.  ?????            Beaulah Dinning, PT, DPT 06/03/20 1:55 PM      [facility] [address] [place], [place], [address] Phone: [phone]   Fax:  [phone]  Name: Emon Lance. MRN: [id number] Date of Birth: [birth year]

## 2020-06-11 ENCOUNTER — Other Ambulatory Visit: Payer: Self-pay

## 2020-06-14 ENCOUNTER — Other Ambulatory Visit: Payer: Self-pay

## 2020-06-14 ENCOUNTER — Ambulatory Visit (INDEPENDENT_AMBULATORY_CARE_PROVIDER_SITE_OTHER): Payer: 59 | Admitting: Family Medicine

## 2020-06-14 ENCOUNTER — Ambulatory Visit (INDEPENDENT_AMBULATORY_CARE_PROVIDER_SITE_OTHER): Payer: 59

## 2020-06-14 ENCOUNTER — Encounter: Payer: Self-pay | Admitting: Family Medicine

## 2020-06-14 VITALS — BP 102/76 | HR 91 | Temp 98.2°F | Ht 70.5 in | Wt 223.7 lb

## 2020-06-14 DIAGNOSIS — R079 Chest pain, unspecified: Secondary | ICD-10-CM

## 2020-06-14 DIAGNOSIS — K219 Gastro-esophageal reflux disease without esophagitis: Secondary | ICD-10-CM | POA: Diagnosis not present

## 2020-06-14 DIAGNOSIS — D171 Benign lipomatous neoplasm of skin and subcutaneous tissue of trunk: Secondary | ICD-10-CM

## 2020-06-14 DIAGNOSIS — G4733 Obstructive sleep apnea (adult) (pediatric): Secondary | ICD-10-CM | POA: Diagnosis not present

## 2020-06-14 DIAGNOSIS — G8929 Other chronic pain: Secondary | ICD-10-CM

## 2020-06-14 DIAGNOSIS — Z0000 Encounter for general adult medical examination without abnormal findings: Secondary | ICD-10-CM | POA: Diagnosis not present

## 2020-06-14 DIAGNOSIS — E039 Hypothyroidism, unspecified: Secondary | ICD-10-CM | POA: Diagnosis not present

## 2020-06-14 DIAGNOSIS — M25569 Pain in unspecified knee: Secondary | ICD-10-CM

## 2020-06-14 DIAGNOSIS — R7989 Other specified abnormal findings of blood chemistry: Secondary | ICD-10-CM

## 2020-06-14 DIAGNOSIS — M545 Low back pain, unspecified: Secondary | ICD-10-CM

## 2020-06-14 DIAGNOSIS — E119 Type 2 diabetes mellitus without complications: Secondary | ICD-10-CM

## 2020-06-14 DIAGNOSIS — M5416 Radiculopathy, lumbar region: Secondary | ICD-10-CM

## 2020-06-14 LAB — TSH: TSH: 2.11 u[IU]/mL (ref 0.35–4.50)

## 2020-06-14 LAB — MICROALBUMIN / CREATININE URINE RATIO
Creatinine,U: 114.4 mg/dL
Microalb Creat Ratio: 0.6 mg/g (ref 0.0–30.0)
Microalb, Ur: <0.7 mg/dL (ref 0.0–1.9)

## 2020-06-14 LAB — TESTOSTERONE: Testosterone: 281.74 ng/dL — ABNORMAL LOW (ref 300.00–890.00)

## 2020-06-14 LAB — COMPREHENSIVE METABOLIC PANEL
ALT: 31 U/L (ref 0–53)
AST: 19 U/L (ref 0–37)
Albumin: 4.7 g/dL (ref 3.5–5.2)
Alkaline Phosphatase: 61 U/L (ref 39–117)
BUN: 13 mg/dL (ref 6–23)
CO2: 32 mEq/L (ref 19–32)
Calcium: 9.5 mg/dL (ref 8.4–10.5)
Chloride: 101 mEq/L (ref 96–112)
Creatinine, Ser: 1.13 mg/dL (ref 0.40–1.50)
GFR: 73.49 mL/min (ref >60.00)
Glucose, Bld: 108 mg/dL — ABNORMAL HIGH (ref 70–99)
Potassium: 4.1 mEq/L (ref 3.5–5.1)
Sodium: 138 mEq/L (ref 135–145)
Total Bilirubin: 0.6 mg/dL (ref 0.2–1.2)
Total Protein: 7.4 g/dL (ref 6.0–8.3)

## 2020-06-14 LAB — HEMOGLOBIN A1C: Hgb A1c MFr Bld: 6.5 % (ref 4.6–6.5)

## 2020-06-14 LAB — PSA: PSA: 1.37 ng/mL (ref 0.10–4.00)

## 2020-06-14 MED ORDER — XYOSTED 75 MG/0.5ML ~~LOC~~ SOAJ: 75.0000 mg | SUBCUTANEOUS | 5 refills | Status: DC

## 2020-06-14 MED ORDER — HYDROCODONE-ACETAMINOPHEN 10-325 MG PO TABS: 1.0000 | ORAL_TABLET | Freq: Two times a day (BID) | ORAL | 0 refills | Status: DC | PRN

## 2020-06-14 NOTE — Addendum Note (Signed)
Addended by: Tessie Fass D on: 06/14/2020 09:54 AM   Modules accepted: Orders

## 2020-06-14 NOTE — Progress Notes (Signed)
Walter Jordan. DOB: Jun 07, 1965 Encounter date: 06/14/2020  This is a 55 y.o. male who presents for complete physical   History of present illness/Additional concerns: He has completed COVID vaccination.  HTN: Losartan 25 mg daily  Back pain: We discussed this at last visit and he was referred to neurosurgery for further evaluation/discussion of treatment options. Saw Duffy Rhody; told him that they could do surgery but it would be in multiple places/fusion. He is typically just doing pain medication once at day; occasionally at night.   Testosterone deficiency: Transitioning to home injections. Couldn't bring self to do this.   DMII: hasn't been exercising regularly - due to back.    Hypothyroid: synthroid 70mcg daily  OSA: compliant with cpap. Follows with Wyn Quaker.  Sees dentist q 6 months.  Last eye exam was over a year ago.  Colonoscopy due 09/2021.   Past Medical History:  Diagnosis Date  . Allergic rhinitis   . Allergy   . Arthritis    back  . ED (erectile dysfunction)   . Elevated lipase   . Enlarged prostate   . GERD (gastroesophageal reflux disease)   . Hepatic steatosis   . Hypothyroidism   . Low back pain   . Low testosterone   . Sleep apnea    had sleep study 09-10-16- needs new cpap   Past Surgical History:  Procedure Laterality Date  . COLONOSCOPY  03/19/2008; 2018   ext. hems   . HERNIA REPAIR     x2 1968, inguinal 1973  . WISDOM TOOTH EXTRACTION     No Known Allergies Current Meds  Medication Sig  . alfuzosin (UROXATRAL) 10 MG 24 hr tablet Take 10 mg by mouth daily.  . Amino Acids (AMINO ACID PO) Take by mouth daily. Arginine po daily  . cyclobenzaprine (FLEXERIL) 10 MG tablet TAKE 1 TABLET BY MOUTH THREE TIMES A DAY AS NEEDED FOR MUSCLE SPASMS  . levothyroxine (SYNTHROID) 50 MCG tablet TAKE 1 TABLET BY MOUTH DAILY BEFORE BREAKFAST  . losartan (COZAAR) 25 MG tablet TAKE 1 TABLET BY MOUTH EVERY DAY  . Multiple Vitamins-Minerals (MEGA  MULTIVITAMIN FOR MEN PO) Take by mouth daily.  . [DISCONTINUED] HYDROcodone-acetaminophen (NORCO) 10-325 MG tablet Take 1 tablet by mouth 2 (two) times daily as needed.  . [DISCONTINUED] testosterone cypionate (DEPOTESTOSTERONE CYPIONATE) 200 MG/ML injection Inject 1 mL (200 mg total) into the muscle every 28 (twenty-eight) days.  . [DISCONTINUED] Testosterone Enanthate (XYOSTED) 75 MG/0.5ML SOAJ Inject 75 mg into the skin once a week.   Current Facility-Administered Medications for the 06/14/20 encounter (Office Visit) with Caren Macadam, MD  Medication  . 0.9 %  sodium chloride infusion   Social History   Tobacco Use  . Smoking status: Never Smoker  . Smokeless tobacco: Never Used  Substance Use Topics  . Alcohol use: Yes    Comment: social   Family History  Problem Relation Age of Onset  . Cancer Father        sarcoma- both kidneys removed  . Diabetes Father        s/p renal transplant  . Arthritis Mother   . High blood pressure Mother   . High Cholesterol Mother   . Heart disease Mother   . Diabetes Brother   . Diabetes Maternal Grandmother   . Diabetes Paternal Grandmother   . Cancer Paternal Grandmother        colon  . Depression Other   . Diabetes Other   . Hypertension Other   .  Kidney disease Other   . Obesity Sister   . Stroke Maternal Grandfather 60       tobacco abuse  . Alcohol abuse Maternal Grandfather   . Early death Paternal Grandfather        accidental  . Prostate cancer Maternal Uncle   . Heart attack Maternal Uncle 60  . Prostate cancer Maternal Uncle   . Cancer Paternal Uncle        uncertain type  . Alcohol abuse Paternal Uncle   . Colon polyps Neg Hx   . Rectal cancer Neg Hx   . Stomach cancer Neg Hx   . Esophageal cancer Neg Hx   . Pancreatic cancer Neg Hx      Review of Systems  Constitutional: Negative for activity change, appetite change, chills, fatigue, fever and unexpected weight change.  HENT: Negative for congestion, ear  pain, hearing loss, sinus pressure, sinus pain, sore throat and trouble swallowing.   Eyes: Negative for pain and visual disturbance.  Respiratory: Negative for cough, chest tightness, shortness of breath and wheezing.   Cardiovascular: Negative for chest pain, palpitations and leg swelling.  Gastrointestinal: Negative for abdominal distention, abdominal pain, blood in stool, constipation, diarrhea, nausea and vomiting.  Genitourinary: Negative for decreased urine volume, difficulty urinating, dysuria, penile pain and testicular pain.  Musculoskeletal: Positive for back pain. Negative for arthralgias and joint swelling.  Skin: Negative for rash.  Neurological: Negative for dizziness, weakness, numbness and headaches.  Hematological: Negative for adenopathy. Does not bruise/bleed easily.  Psychiatric/Behavioral: Negative for agitation, sleep disturbance and suicidal ideas. The patient is not nervous/anxious.     CBC:  Lab Results  Component Value Date   WBC 4.0 02/06/2020   HGB 15.4 02/06/2020   HCT 46.1 02/06/2020   MCH 31.9 02/06/2020   MCHC 33.4 02/06/2020   RDW 12.5 02/06/2020   PLT 251 02/06/2020   MPV 10.7 02/06/2020   CMP: Lab Results  Component Value Date   NA 138 06/14/2020   K 4.1 06/14/2020   CL 101 06/14/2020   CO2 32 06/14/2020   GLUCOSE 108 (H) 06/14/2020   BUN 13 06/14/2020   CREATININE 1.13 06/14/2020   CREATININE 1.13 12/04/2019   CALCIUM 9.5 06/14/2020   PROT 7.4 06/14/2020   BILITOT 0.6 06/14/2020   ALKPHOS 61 06/14/2020   ALT 31 06/14/2020   AST 19 06/14/2020   LIPID: Lab Results  Component Value Date   CHOL 176 12/04/2019   TRIG 94 12/04/2019   HDL 49 12/04/2019   LDLCALC 108 (H) 12/04/2019    Objective:  BP 102/76 (BP Location: Left Arm, Patient Position: Sitting, Cuff Size: Large)   Pulse 91   Temp 98.2 F (36.8 C) (Oral)   Ht 5' 10.5" (1.791 m)   Wt 223 lb 11.2 oz (101.5 kg)   SpO2 99%   BMI 31.64 kg/m   Weight: 223 lb 11.2 oz  (101.5 kg)   BP Readings from Last 3 Encounters:  06/14/20 102/76  03/12/20 102/80  12/04/19 128/90   Wt Readings from Last 3 Encounters:  06/14/20 223 lb 11.2 oz (101.5 kg)  03/12/20 227 lb 6.4 oz (103.1 kg)  12/04/19 227 lb 6.4 oz (103.1 kg)    Physical Exam Constitutional:      General: He is not in acute distress.    Appearance: He is well-developed and well-nourished.  HENT:     Head: Normocephalic and atraumatic.     Right Ear: External ear normal.  Left Ear: External ear normal.     Nose: Nose normal.     Mouth/Throat:     Mouth: Oropharynx is clear and moist.     Pharynx: No oropharyngeal exudate.  Eyes:     Conjunctiva/sclera: Conjunctivae normal.     Pupils: Pupils are equal, round, and reactive to light.  Neck:     Thyroid: No thyromegaly.  Cardiovascular:     Rate and Rhythm: Normal rate and regular rhythm.     Pulses: Intact distal pulses.     Heart sounds: Normal heart sounds. No murmur heard. No friction rub. No gallop.   Pulmonary:     Effort: Pulmonary effort is normal. No respiratory distress.     Breath sounds: Normal breath sounds. No stridor. No wheezing or rales.  Chest:       Comments: 2cm soft mobile mass right anterior chest. Tender to palpation. Underlying rib does not seem tender to palpation.  Abdominal:     General: Bowel sounds are normal.     Palpations: Abdomen is soft.     Hernia: There is no hernia in the left inguinal area or right inguinal area.  Genitourinary:    Penis: Normal.      Testes: Normal.  Musculoskeletal:        General: Normal range of motion.     Cervical back: Neck supple.  Skin:    General: Skin is warm and dry.  Neurological:     Mental Status: He is alert and oriented to person, place, and time.  Psychiatric:        Mood and Affect: Mood and affect normal.        Behavior: Behavior normal.        Thought Content: Thought content normal.        Judgment: Judgment normal.      Assessment/Plan: Health Maintenance Due  Topic Date Due  . PNEUMOCOCCAL POLYSACCHARIDE VACCINE AGE 86-64 HIGH RISK  Never done  . FOOT EXAM  Never done  . OPHTHALMOLOGY EXAM  05/22/2020   Health Maintenance reviewed.  1. Chronic knee pain, unspecified laterality This is chronic, stable. norco also used for chronic back pain. He does not want surgery as he is functional with the help of medication at this point. - HYDROcodone-acetaminophen (NORCO) 10-325 MG tablet; Take 1 tablet by mouth 2 (two) times daily as needed.  Dispense: 60 tablet; Refill: 0 - DRUG MONITOR, PANEL 1, W/CONF, URINE; Future - DRUG MONITOR, PANEL 1, W/CONF, URINE  2. Obstructive sleep apnea Compliant with cpap.  - PSA; Future - CMP; Future - CMP - PSA  3. Gastroesophageal reflux disease, unspecified whether esophagitis present Stable without medication.   4. Hypothyroidism, unspecified type Continue synthroid 38mcg daily. - TSH; Future - TSH  5. Controlled type 2 diabetes mellitus without complication, without long-term current use of insulin ([name]) Has been diet controlled; recheck bloodwork today. - Microalbumin/Creatinine Ratio, Urine; Future - Hemoglobin A1c; Future - PSA; Future - CMP; Future - CMP - PSA - Hemoglobin A1c - Microalbumin/Creatinine Ratio, Urine  6. Lumbar radiculopathy Has daily pain. Controlled with norco; see above. Has seen specialist and would like to avoid surgyer.  7. Low testosterone in male Recheck bloodwork today. Will try to switch to xyosted to avoid monthly office visits. - PSA; Future - Testosterone; Future - Testosterone - PSA  8. Chest pain, unspecified type Due to concerns with mass on the side, will get chest x-ray.  I do feel that this is a  lipoma, but we will make sure no underlying bony abnormality on the chest X ray. - DG Chest 2 View; Future - DRUG MONITOR, PANEL 1, W/CONF, URINE; Future - DRUG MONITOR, PANEL 1, W/CONF, URINE  9. Lipoma of  anterior chest wall Discussed that intervention at this point is really optional.  If enlarging or uncomfortable, would suggest referral to general surgery.  He will keep an eye on this.  Return in about 3 months (around 09/12/2020) for Chronic condition visit.  Micheline Rough, MD

## 2020-06-14 NOTE — Addendum Note (Signed)
Addended by: Tessie Fass D on: 06/14/2020 09:55 AM   Modules accepted: Orders

## 2020-06-14 NOTE — Addendum Note (Signed)
Addended by: PARRISH, TERESA D on: 06/14/2020 09:55 AM   Modules accepted: Orders  

## 2020-06-16 ENCOUNTER — Other Ambulatory Visit: Payer: Self-pay | Admitting: Family Medicine

## 2020-06-16 ENCOUNTER — Ambulatory Visit (INDEPENDENT_AMBULATORY_CARE_PROVIDER_SITE_OTHER): Payer: 59 | Admitting: *Deleted

## 2020-06-16 ENCOUNTER — Other Ambulatory Visit: Payer: Self-pay

## 2020-06-16 DIAGNOSIS — R7989 Other specified abnormal findings of blood chemistry: Secondary | ICD-10-CM

## 2020-06-16 LAB — DRUG MONITOR, PANEL 1, W/CONF, URINE
Amphetamines: NEGATIVE ng/mL
Barbiturates: NEGATIVE ng/mL
Benzodiazepines: NEGATIVE ng/mL
Cocaine Metabolite: NEGATIVE ng/mL
Codeine: NEGATIVE ng/mL
Creatinine: 115.6 mg/dL
Hydrocodone: 729 ng/mL — ABNORMAL HIGH
Hydromorphone: 56 ng/mL — ABNORMAL HIGH
Marijuana Metabolite: NEGATIVE ng/mL
Methadone Metabolite: NEGATIVE ng/mL
Morphine: NEGATIVE ng/mL
Norhydrocodone: 727 ng/mL — ABNORMAL HIGH
Opiates: POSITIVE ng/mL — AB
Oxidant: NEGATIVE ug/mL
Oxycodone: NEGATIVE ng/mL
Phencyclidine: NEGATIVE ng/mL
pH: 6.6 (ref 4.5–9.0)

## 2020-06-16 LAB — DM TEMPLATE

## 2020-06-16 MED ORDER — TESTOSTERONE CYPIONATE 200 MG/ML IM SOLN
200.0000 mg | Freq: Once | INTRAMUSCULAR | Status: AC
  Administered 2020-06-16: 200 mg via INTRAMUSCULAR

## 2020-06-16 MED ORDER — XYOSTED 75 MG/0.5ML ~~LOC~~ SOAJ: 75.0000 mg | SUBCUTANEOUS | 5 refills | Status: DC

## 2020-06-16 NOTE — Progress Notes (Signed)
Rx resent.

## 2020-06-16 NOTE — Progress Notes (Signed)
Per orders of Dr. Koberlein, injection of Testosterone cypionate 200mg given by Elex Mainwaring A. Patient tolerated injection well.  

## 2020-07-16 ENCOUNTER — Encounter: Payer: Self-pay | Admitting: Family Medicine

## 2020-07-16 ENCOUNTER — Other Ambulatory Visit: Payer: Self-pay

## 2020-07-16 ENCOUNTER — Ambulatory Visit: Payer: 59 | Admitting: Family Medicine

## 2020-07-16 VITALS — BP 118/88 | HR 79 | Temp 98.8°F | Ht 70.5 in | Wt 223.7 lb

## 2020-07-16 DIAGNOSIS — R7989 Other specified abnormal findings of blood chemistry: Secondary | ICD-10-CM | POA: Diagnosis not present

## 2020-07-16 DIAGNOSIS — R0781 Pleurodynia: Secondary | ICD-10-CM

## 2020-07-16 DIAGNOSIS — E039 Hypothyroidism, unspecified: Secondary | ICD-10-CM

## 2020-07-16 DIAGNOSIS — M549 Dorsalgia, unspecified: Secondary | ICD-10-CM | POA: Diagnosis not present

## 2020-07-16 DIAGNOSIS — Z13220 Encounter for screening for lipoid disorders: Secondary | ICD-10-CM

## 2020-07-16 DIAGNOSIS — E119 Type 2 diabetes mellitus without complications: Secondary | ICD-10-CM

## 2020-07-16 DIAGNOSIS — I10 Essential (primary) hypertension: Secondary | ICD-10-CM

## 2020-07-16 MED ORDER — TESTOSTERONE CYPIONATE 200 MG/ML IM SOLN
200.0000 mg | Freq: Once | INTRAMUSCULAR | Status: AC
  Administered 2020-07-16: 200 mg via INTRAMUSCULAR

## 2020-07-16 NOTE — Progress Notes (Signed)
Walter Jordan. DOB: 1966/01/10 Encounter date: 07/16/2020  This is a 55 y.o. male who presents with Chief Complaint  Patient presents with  . Follow-up    History of present illness: Last visit was 06/14/20 for preventative health.   Insurance didn't cover xyosted; he is having to do regular testosterone injections.   Noting pain on both sides anterior chest - sometimes with driving, sometimes with sitting. At least once it was on left side; other three times it was on right side.   No Known Allergies Current Meds  Medication Sig  . alfuzosin (UROXATRAL) 10 MG 24 hr tablet Take 10 mg by mouth daily.  . cyclobenzaprine (FLEXERIL) 10 MG tablet TAKE 1 TABLET BY MOUTH THREE TIMES A DAY AS NEEDED FOR MUSCLE SPASMS  . HYDROcodone-acetaminophen (NORCO) 10-325 MG tablet Take 1 tablet by mouth 2 (two) times daily as needed.  Marland Kitchen levothyroxine (SYNTHROID) 50 MCG tablet TAKE 1 TABLET BY MOUTH DAILY BEFORE BREAKFAST  . losartan (COZAAR) 25 MG tablet TAKE 1 TABLET BY MOUTH EVERY DAY  . Multiple Vitamins-Minerals (MEGA MULTIVITAMIN FOR MEN PO) Take by mouth daily.  . Testosterone Enanthate (XYOSTED) 75 MG/0.5ML SOAJ Inject 75 mg into the skin once a week.   Current Facility-Administered Medications for the 07/16/20 encounter (Office Visit) with Caren Macadam, MD  Medication  . 0.9 %  sodium chloride infusion    Review of Systems  Objective:  BP 118/88 (BP Location: Left Arm, Patient Position: Sitting, Cuff Size: Normal)   Pulse 79   Temp 98.8 F (37.1 C) (Oral)   Ht 5' 10.5" (1.791 m)   Wt 223 lb 11.2 oz (101.5 kg)   SpO2 96%   BMI 31.64 kg/m   Weight: 223 lb 11.2 oz (101.5 kg)   BP Readings from Last 3 Encounters:  07/16/20 118/88  06/14/20 102/76  03/12/20 102/80   Wt Readings from Last 3 Encounters:  07/16/20 223 lb 11.2 oz (101.5 kg)  06/14/20 223 lb 11.2 oz (101.5 kg)  03/12/20 227 lb 6.4 oz (103.1 kg)    Physical Exam Vitals reviewed.  Constitutional:       Appearance: Normal appearance.  Pulmonary:     Effort: Pulmonary effort is normal.  Chest:     Comments: Anterior bilat lower rib tenderness. Palpation of these ribs replicates discomfort that he is feeling.  Neurological:     Mental Status: He is alert.     Assessment/Plan  1. Mid back pain on right side He wishes to avoid surgery; but has increasing spasm on right side of back. I am going to refer to sports med/Dr. Tamala Julian hopefully for eval and maybe help with management of this. I suspect that intermittent anterior rib pain is related to some mild rib torsion secondary to ongoing back issues and hope that this can be helped with osteopathic work. Patient does not want to complete PT again; has done this in past and didn't find it helpful overall as well as cost issue. - Ambulatory referral to Sports Medicine  2. Rib pain See above.  - Ambulatory referral to Sports Medicine  3. Low testosterone Does well with injection - but advised him to monitor sx and make sure he is getting benefit with these. Most notable improvement is energy level for him.   4. Controlled type 2 diabetes mellitus without complication, without long-term current use of insulin ([facility]) We discussed avoidance of medication as long as sugar is remaining stable. We discussed benefit of statin with  diabetes, but he would prefer to monitor and continue to discuss. He is on losartan for renal protection.        Micheline Rough, MD

## 2020-07-16 NOTE — Addendum Note (Signed)
Addended by: Agnes Lawrence on: 07/16/2020 09:19 AM   Modules accepted: Orders

## 2020-07-27 NOTE — Progress Notes (Signed)
[place] [address] [place] Phone: [phone] Subjective:   I [name] am serving as a Education administrator for Dr. Hulan Saas.  This visit occurred during the SARS-CoV-2 public health emergency.  Safety protocols were in place, including screening questions prior to the visit, additional usage of staff PPE, and extensive cleaning of exam room while observing appropriate contact time as indicated for disinfecting solutions.   I'm seeing this patient by the request  of:  Caren Macadam, MD  CC: Low back pain  FUX:[id number]  Walter Jordan. is a 55 y.o. male coming in with complaint of back pain. Back pain is chronic. States he been to neurosurgery and is not ready. Pain is in multiple location. States he has a lot of muscle spasms. Has tried hydrocodone and flexeril PRN. States he tries to avoid it. States he believes he has a loss of ROM and his back is always tight in the lower back area. 6/10 at its worse.  Patient is seeing different providers for previously.  Has seen a neurosurgeon but does not want to have surgery at this time.  Patient wants to try other things first.  Patient has had epidurals and per notes it appears that the last one was in April 2021.  Does get some mild benefit from it.  Continues to try to workout on a fairly regular basis.  MRI 08/16/2019 Lumbar IMPRESSION: 1. Left eccentric disc osteophyte and facet hypertrophy at L4-5 with resultant severe left foraminal stenosis, with moderate to severe left lateral recess narrowing. Either the left L4 or descending L5 nerve roots could be affected. 2. Chronic degenerative disc osteophyte at L5-S1, contacting the descending S1 nerve roots bilaterally without frank neural impingement or displacement. Moderate to severe bilateral L5 foraminal stenosis at this level. 3. Right extraforaminal disc protrusion at L3-4, potentially affecting the exiting right L3 nerve  root. 4. Disc bulge with facet hypertrophy at L2-3 with resultant moderate spinal stenosis. 5. Overall, these changes are progressed relative to 2016.     Past Medical History:  Diagnosis Date  . Allergic rhinitis   . Allergy   . Arthritis    back  . ED (erectile dysfunction)   . Elevated lipase   . Enlarged prostate   . GERD (gastroesophageal reflux disease)   . Hepatic steatosis   . Hypothyroidism   . Low back pain   . Low testosterone   . Sleep apnea    had sleep study 09-10-16- needs new cpap   Past Surgical History:  Procedure Laterality Date  . COLONOSCOPY  03/19/2008; 2018   ext. hems   . HERNIA REPAIR     x2 1968, inguinal 1973  . WISDOM TOOTH EXTRACTION     Social History   Socioeconomic History  . Marital status: Married    Spouse name: Not on file  . Number of children: Not on file  . Years of education: Not on file  . Highest education level: Not on file  Occupational History  . Not on file  Tobacco Use  . Smoking status: Never Smoker  . Smokeless tobacco: Never Used  Vaping Use  . Vaping Use: Never used  Substance and Sexual Activity  . Alcohol use: Yes    Comment: social  . Drug use: No  . Sexual activity: Not on file  Other Topics Concern  . Not on file  Social History Narrative  . Not on file   Social  Determinants of Health   Financial Resource Strain: Not on file  Food Insecurity: Not on file  Transportation Needs: Not on file  Physical Activity: Not on file  Stress: Not on file  Social Connections: Not on file   No Known Allergies Family History  Problem Relation Age of Onset  . Cancer Father        sarcoma- both kidneys removed  . Diabetes Father        s/p renal transplant  . Arthritis Mother   . High blood pressure Mother   . High Cholesterol Mother   . Heart disease Mother   . Diabetes Brother   . Diabetes Maternal Grandmother   . Diabetes Paternal Grandmother   . Cancer Paternal Grandmother        colon  .  Depression Other   . Diabetes Other   . Hypertension Other   . Kidney disease Other   . Obesity Sister   . Stroke Maternal Grandfather 60       tobacco abuse  . Alcohol abuse Maternal Grandfather   . Early death Paternal Grandfather        accidental  . Prostate cancer Maternal Uncle   . Heart attack Maternal Uncle 60  . Prostate cancer Maternal Uncle   . Cancer Paternal Uncle        uncertain type  . Alcohol abuse Paternal Uncle   . Colon polyps Neg Hx   . Rectal cancer Neg Hx   . Stomach cancer Neg Hx   . Esophageal cancer Neg Hx   . Pancreatic cancer Neg Hx     Current Outpatient Medications (Endocrine & Metabolic):  .  levothyroxine (SYNTHROID) 50 MCG tablet, TAKE 1 TABLET BY MOUTH DAILY BEFORE BREAKFAST   Current Outpatient Medications (Cardiovascular):  .  losartan (COZAAR) 25 MG tablet, TAKE 1 TABLET BY MOUTH EVERY DAY     Current Outpatient Medications (Analgesics):  .  HYDROcodone-acetaminophen (NORCO) 10-325 MG tablet, Take 1 tablet by mouth 2 (two) times daily as needed.     Current Outpatient Medications (Other):  .  alfuzosin (UROXATRAL) 10 MG 24 hr tablet, Take 10 mg by mouth daily. .  cyclobenzaprine (FLEXERIL) 10 MG tablet, TAKE 1 TABLET BY MOUTH THREE TIMES A DAY AS NEEDED FOR MUSCLE SPASMS .  gabapentin (NEURONTIN) 100 MG capsule, Take 2 capsules (200 mg total) by mouth at bedtime. .  Multiple Vitamins-Minerals (MEGA MULTIVITAMIN FOR MEN PO), Take by mouth daily.  Current Facility-Administered Medications (Other):  .  0.9 %  sodium chloride infusion   Reviewed prior external information including notes and imaging from  primary care provider As well as notes that were available from care everywhere and other healthcare systems.  Past medical history, social, surgical and family history all reviewed in electronic medical record.  No pertanent information unless stated regarding to the chief complaint.   Review of Systems:  No headache, visual  changes, nausea, vomiting, diarrhea, constipation, dizziness, abdominal pain, skin rash, fevers, chills, night sweats, weight loss, swollen lymph nodes, body aches, joint swelling, chest pain, shortness of breath, mood changes. POSITIVE muscle aches  Objective  Blood pressure 130/90, pulse 79, height 5' 10.5" (1.791 m), weight 222 lb (100.7 kg), SpO2 97 %.   General: No apparent distress alert and oriented x3 mood and affect normal, dressed appropriately.  HEENT: Pupils equal, extraocular movements intact  Respiratory: Patient's speak in full sentences and does not appear short of breath  Cardiovascular: No lower extremity edema, non  tender, no erythema  Gait normal with good balance and coordination.  MSK:  Non tender with full range of motion and good stability and symmetric strength and tone of shoulders, elbows, wrist, hip, knee and ankles bilaterally.  Patient does have some mild loss of lordosis of the lumbar spine.  With left-sided Corky Sox patient does have some mild tightness noted in the lumbar spine on the right side.  No trouble though with the right-sided Corky Sox then.  Straight leg test test that was some mild cramping noted in the right hamstring.  No true radicular symptoms at the moment though.  Does lack the last 5 degrees of extension.  Osteopathic findings T5 extended rotated and side bent left L1 flexed rotated and side bent right Sacrum right on right   Impression and Recommendations:     The above documentation has been reviewed and is accurate and complete [name], DO

## 2020-07-28 ENCOUNTER — Encounter: Payer: Self-pay | Admitting: Family Medicine

## 2020-07-28 ENCOUNTER — Other Ambulatory Visit: Payer: Self-pay

## 2020-07-28 ENCOUNTER — Ambulatory Visit: Payer: 59 | Admitting: Family Medicine

## 2020-07-28 DIAGNOSIS — M999 Biomechanical lesion, unspecified: Secondary | ICD-10-CM

## 2020-07-28 DIAGNOSIS — M5416 Radiculopathy, lumbar region: Secondary | ICD-10-CM | POA: Diagnosis not present

## 2020-07-28 MED ORDER — GABAPENTIN 100 MG PO CAPS: 200.0000 mg | ORAL_CAPSULE | Freq: Every day | ORAL | 3 refills | Status: DC

## 2020-07-28 NOTE — Assessment & Plan Note (Signed)
Patient has had some radiculopathy before.  Patient has had injections and the last one was in April 2021.  Patient wants to hold on those at this time.  We discussed different treatment options and we discussed the potential for the risk of the osteopathic manipulation which patient did want to try.  Patient did respond very well to it today.  Discussed icing regimen and home exercises.  Discussed core strengthening and stability exercises.  Patient work with Product/process development scientist today.  Low dose of gabapentin prescribed today.  Warned of potential side effects including somnolence.  Patient will follow up again in 4 to 6 weeks.

## 2020-07-28 NOTE — Patient Instructions (Signed)
Good to see you Tried manipulation today Gabapentin 200 mg at night Ice 20 mins Exercises for the hip flexor Tart cherry 1200 mg at night See me again in 4 weeks

## 2020-07-28 NOTE — Assessment & Plan Note (Addendum)
   Decision today to treat with OMT was based on Physical Exam did discuss with patient beforehand of the possible aggravation and potential spasms of the wrist with these amount of severe arthritis that patient has.  Patient was willing to try.  After verbal consent patient was treated with HVLA, ME, techniques in  thoracic, umbar and sacral areas,   Patient tolerated the procedure well with improvement in symptoms  Patient given exercises, stretches and lifestyle modifications  See medications in patient instructions if given  Patient will follow up in 4-8 weeks

## 2020-08-22 ENCOUNTER — Other Ambulatory Visit: Payer: Self-pay | Admitting: Family Medicine

## 2020-08-23 ENCOUNTER — Other Ambulatory Visit: Payer: Self-pay | Admitting: Family Medicine

## 2020-08-23 DIAGNOSIS — G8929 Other chronic pain: Secondary | ICD-10-CM

## 2020-08-23 MED ORDER — HYDROCODONE-ACETAMINOPHEN 10-325 MG PO TABS: 1.0000 | ORAL_TABLET | Freq: Two times a day (BID) | ORAL | 0 refills | Status: DC | PRN

## 2020-08-25 ENCOUNTER — Other Ambulatory Visit: Payer: Self-pay | Admitting: Family Medicine

## 2020-08-25 ENCOUNTER — Other Ambulatory Visit: Payer: Self-pay

## 2020-08-25 ENCOUNTER — Ambulatory Visit (INDEPENDENT_AMBULATORY_CARE_PROVIDER_SITE_OTHER): Payer: 59 | Admitting: *Deleted

## 2020-08-25 DIAGNOSIS — R7989 Other specified abnormal findings of blood chemistry: Secondary | ICD-10-CM | POA: Diagnosis not present

## 2020-08-25 MED ORDER — TESTOSTERONE CYPIONATE 200 MG/ML IM SOLN
200.0000 mg | Freq: Once | INTRAMUSCULAR | Status: AC
  Administered 2020-08-25: 200 mg via INTRAMUSCULAR

## 2020-08-25 NOTE — Progress Notes (Signed)
[facility] [address] [place] Phone: [phone] Subjective:   I Walter Jordan am serving as a Education administrator for Dr. Hulan Jordan.  This visit occurred during the SARS-CoV-2 public health emergency.  Safety protocols were in place, including screening questions prior to the visit, additional usage of staff PPE, and extensive cleaning of exam room while observing appropriate contact time as indicated for disinfecting solutions.   I'm seeing this patient by the request  of:  Walter Macadam, MD  CC: Neck and back pain follow-up  YSA:[id number]  Walter Jordan. is a 55 y.o. male coming in with complaint of back and neck pain. OMT 07/28/2020. Patient states he is stiff and tight. Right side thoracic is the worse and states it spasms a lot often than anywhere else.  Patient states though that is using only the medications such as the gabapentin and Flexeril very sparingly.  Patient states that it can be bad but does not stop him from activity.  Still not walking a significant amount though.  Patient does state he walked for nearly 2 hours the other day.  Medications patient has been prescribed: Gabapentin   MRI of the lumbar spine did show that patient has had a large left-sided disc osteophyte and facet hypertrophy at L4-L5 with severe left-sided foraminal stenosis causing contact with the left L4 and L5 nerve roots.  Patient does have moderate to severe bilateral L5 foraminal stenosis as well       Reviewed prior external information including notes and imaging from previsou exam, outside providers and external EMR if available.   As well as notes that were available from care everywhere and other healthcare systems.  Past medical history, social, surgical and family history all reviewed in electronic medical record.  No pertanent information unless stated regarding to the chief complaint.   Past Medical History:  Diagnosis Date  .  Allergic rhinitis   . Allergy   . Arthritis    back  . ED (erectile dysfunction)   . Elevated lipase   . Enlarged prostate   . GERD (gastroesophageal reflux disease)   . Hepatic steatosis   . Hypothyroidism   . Low back pain   . Low testosterone   . Sleep apnea    had sleep study 09-10-16- needs new cpap    No Known Allergies   Review of Systems:  No headache, visual changes, nausea, vomiting, diarrhea, constipation, dizziness, abdominal pain, skin rash, fevers, chills, night sweats, weight loss, swollen lymph nodes, body aches, joint swelling, chest pain, shortness of breath, mood changes. POSITIVE muscle aches  Objective  Blood pressure 108/80, pulse 73, height 5\' 10"  (1.778 m), weight 214 lb (97.1 kg), SpO2 97 %.   General: No apparent distress alert and oriented x3 mood and affect normal, dressed appropriately.  HEENT: Pupils equal, extraocular movements intact  Respiratory: Patient's speak in full sentences and does not appear short of breath  Cardiovascular: No lower extremity edema, non tender, no erythema   Gait normal with good balance and coordination.  MSK:  Non tender with full range of motion and good stability and symmetric strength and tone of shoulders, elbows, wrist, hip, knee and ankles bilaterally.  Back -low back exam does have loss of lordosis.  Tenderness to palpation in the paraspinal musculature of the lumbar spine with greater than left.  Patient does have tightness with straight leg test.  Tightness with FABER test as well.  Worsening pain  with extension of the back negative straight leg test noted.  Osteopathic findings  T9 extended rotated and side bent left L2 flexed rotated and side bent right Sacrum right on right       Assessment and Plan:  Lumbar radiculopathy Patient responded fairly well to osteopathic manipulation but only short-term.  Patient is taking gabapentin and Flexeril intermittently.  Patient did have good movement of the  lumbar spine with manipulation today.  Hopefully patient will make significant progress.  Follow-up with me again in 5 to 6 weeks    Nonallopathic problems  Decision today to treat with OMT was based on Physical Exam  After verbal consent patient was treated with HVLA, ME, FPR techniques in  thoracic, lumbar, and sacral  areas  Patient tolerated the procedure well with improvement in symptoms  Patient given exercises, stretches and lifestyle modifications  See medications in patient instructions if given  Patient will follow up in 4-8 weeks      The above documentation has been reviewed and is accurate and complete Walter Pulley, DO       Note: This dictation was prepared with Dragon dictation along with smaller phrase technology. Any transcriptional errors that result from this process are unintentional.

## 2020-08-25 NOTE — Progress Notes (Signed)
Per orders of Dr. Ethlyn Gallery, injection of Testosterone cypionate 200mg  given by Agnes Lawrence. Patient tolerated injection well.

## 2020-08-26 ENCOUNTER — Other Ambulatory Visit: Payer: Self-pay

## 2020-08-26 ENCOUNTER — Encounter: Payer: Self-pay | Admitting: Family Medicine

## 2020-08-26 ENCOUNTER — Ambulatory Visit: Payer: 59 | Admitting: Family Medicine

## 2020-08-26 VITALS — BP 108/80 | HR 73 | Ht 70.0 in | Wt 214.0 lb

## 2020-08-26 DIAGNOSIS — M999 Biomechanical lesion, unspecified: Secondary | ICD-10-CM | POA: Diagnosis not present

## 2020-08-26 DIAGNOSIS — M5416 Radiculopathy, lumbar region: Secondary | ICD-10-CM

## 2020-08-26 NOTE — Patient Instructions (Signed)
Good to see you  Hope you get to the car show Stretch after walking Stay active  See me again in 5-6 weeks

## 2020-08-26 NOTE — Assessment & Plan Note (Signed)
Patient responded fairly well to osteopathic manipulation but only short-term.  Patient is taking gabapentin and Flexeril intermittently.  Patient did have good movement of the lumbar spine with manipulation today.  Hopefully patient will make significant progress.  Follow-up with me again in 5 to 6 weeks

## 2020-09-23 ENCOUNTER — Other Ambulatory Visit: Payer: Self-pay

## 2020-09-24 ENCOUNTER — Ambulatory Visit (INDEPENDENT_AMBULATORY_CARE_PROVIDER_SITE_OTHER): Payer: 59 | Admitting: *Deleted

## 2020-09-24 DIAGNOSIS — R7989 Other specified abnormal findings of blood chemistry: Secondary | ICD-10-CM

## 2020-09-24 MED ORDER — TESTOSTERONE CYPIONATE 200 MG/ML IM SOLN
200.0000 mg | Freq: Once | INTRAMUSCULAR | Status: AC
  Administered 2020-09-24: 200 mg via INTRAMUSCULAR

## 2020-09-24 NOTE — Progress Notes (Signed)
Per orders of Dr. Burchette, injection of Testosterone cypionate 200mg given by Adaiah Morken A. Patient tolerated injection well.  

## 2020-10-08 ENCOUNTER — Ambulatory Visit: Payer: 59 | Admitting: Family Medicine

## 2020-10-21 ENCOUNTER — Other Ambulatory Visit: Payer: Self-pay | Admitting: Family Medicine

## 2020-10-21 ENCOUNTER — Other Ambulatory Visit: Payer: Self-pay

## 2020-10-21 DIAGNOSIS — G8929 Other chronic pain: Secondary | ICD-10-CM

## 2020-10-21 DIAGNOSIS — M25569 Pain in unspecified knee: Secondary | ICD-10-CM

## 2020-10-22 ENCOUNTER — Ambulatory Visit (INDEPENDENT_AMBULATORY_CARE_PROVIDER_SITE_OTHER): Payer: 59

## 2020-10-22 DIAGNOSIS — R7989 Other specified abnormal findings of blood chemistry: Secondary | ICD-10-CM | POA: Diagnosis not present

## 2020-10-22 MED ORDER — HYDROCODONE-ACETAMINOPHEN 10-325 MG PO TABS: 1.0000 | ORAL_TABLET | Freq: Two times a day (BID) | ORAL | 0 refills | Status: DC | PRN

## 2020-10-22 MED ORDER — TESTOSTERONE CYPIONATE 200 MG/ML IM SOLN
200.0000 mg | Freq: Once | INTRAMUSCULAR | Status: AC
  Administered 2020-10-22: 200 mg via INTRAMUSCULAR

## 2020-10-22 NOTE — Progress Notes (Signed)
Per orders of Dr. Jerilee Hoh, injection of Testosterone given by Rodrigo Ran. Patient tolerated injection well.

## 2020-11-19 ENCOUNTER — Ambulatory Visit (INDEPENDENT_AMBULATORY_CARE_PROVIDER_SITE_OTHER): Payer: 59 | Admitting: *Deleted

## 2020-11-19 ENCOUNTER — Other Ambulatory Visit: Payer: Self-pay

## 2020-11-19 DIAGNOSIS — R7989 Other specified abnormal findings of blood chemistry: Secondary | ICD-10-CM

## 2020-11-19 DIAGNOSIS — E291 Testicular hypofunction: Secondary | ICD-10-CM

## 2020-11-19 MED ORDER — TESTOSTERONE CYPIONATE 200 MG/ML IM SOLN
100.0000 mg | Freq: Once | INTRAMUSCULAR | Status: AC
  Administered 2020-11-19: 100 mg via INTRAMUSCULAR

## 2020-11-19 NOTE — Progress Notes (Signed)
Per orders of Dr. Elease Hashimoto, injection of testosterone  given by Westley Hummer. Patient tolerated injection well.

## 2020-11-24 ENCOUNTER — Other Ambulatory Visit: Payer: Self-pay | Admitting: Family Medicine

## 2020-11-24 DIAGNOSIS — I10 Essential (primary) hypertension: Secondary | ICD-10-CM

## 2020-12-01 ENCOUNTER — Other Ambulatory Visit: Payer: Self-pay | Admitting: Family Medicine

## 2020-12-01 DIAGNOSIS — G8929 Other chronic pain: Secondary | ICD-10-CM

## 2020-12-03 MED ORDER — HYDROCODONE-ACETAMINOPHEN 10-325 MG PO TABS: 1.0000 | ORAL_TABLET | Freq: Two times a day (BID) | ORAL | 0 refills | Status: DC | PRN

## 2020-12-17 ENCOUNTER — Other Ambulatory Visit: Payer: Self-pay

## 2020-12-17 ENCOUNTER — Ambulatory Visit (INDEPENDENT_AMBULATORY_CARE_PROVIDER_SITE_OTHER): Payer: 59 | Admitting: *Deleted

## 2020-12-17 DIAGNOSIS — R7989 Other specified abnormal findings of blood chemistry: Secondary | ICD-10-CM | POA: Diagnosis not present

## 2020-12-17 MED ORDER — TESTOSTERONE CYPIONATE 200 MG/ML IM SOLN
200.0000 mg | Freq: Once | INTRAMUSCULAR | Status: AC
  Administered 2020-12-17: 200 mg via INTRAMUSCULAR

## 2020-12-17 NOTE — Progress Notes (Signed)
Per orders of Dr. Ethlyn Gallery, injection of Testosterone cypionate '200mg'$  given by Agnes Lawrence. Patient tolerated injection well.

## 2021-01-07 ENCOUNTER — Other Ambulatory Visit (INDEPENDENT_AMBULATORY_CARE_PROVIDER_SITE_OTHER): Payer: 59

## 2021-01-07 ENCOUNTER — Other Ambulatory Visit: Payer: Self-pay

## 2021-01-07 DIAGNOSIS — E039 Hypothyroidism, unspecified: Secondary | ICD-10-CM | POA: Diagnosis not present

## 2021-01-07 DIAGNOSIS — Z13220 Encounter for screening for lipoid disorders: Secondary | ICD-10-CM | POA: Diagnosis not present

## 2021-01-07 DIAGNOSIS — I10 Essential (primary) hypertension: Secondary | ICD-10-CM | POA: Diagnosis not present

## 2021-01-07 DIAGNOSIS — R7989 Other specified abnormal findings of blood chemistry: Secondary | ICD-10-CM | POA: Diagnosis not present

## 2021-01-07 DIAGNOSIS — E119 Type 2 diabetes mellitus without complications: Secondary | ICD-10-CM | POA: Diagnosis not present

## 2021-01-07 DIAGNOSIS — G4733 Obstructive sleep apnea (adult) (pediatric): Secondary | ICD-10-CM

## 2021-01-07 LAB — LIPID PANEL
Cholesterol: 148 mg/dL (ref 0–200)
HDL: 48.6 mg/dL (ref >39.00)
LDL Cholesterol: 85 mg/dL (ref 0–99)
NonHDL: 99.07
Total CHOL/HDL Ratio: 3
Triglycerides: 68 mg/dL (ref 0.0–149.0)
VLDL: 13.6 mg/dL (ref 0.0–40.0)

## 2021-01-07 LAB — CBC WITH DIFFERENTIAL/PLATELET
Basophils Absolute: 0.1 10*3/uL (ref 0.0–0.1)
Basophils Relative: 1.4 % (ref 0.0–3.0)
Eosinophils Absolute: 0.1 10*3/uL (ref 0.0–0.7)
Eosinophils Relative: 2.4 % (ref 0.0–5.0)
HCT: 48.7 % (ref 39.0–52.0)
Hemoglobin: 16.4 g/dL (ref 13.0–17.0)
Lymphocytes Relative: 29.1 % (ref 12.0–46.0)
Lymphs Abs: 1.2 10*3/uL (ref 0.7–4.0)
MCHC: 33.7 g/dL (ref 30.0–36.0)
MCV: 95.3 fl (ref 78.0–100.0)
Monocytes Absolute: 0.5 10*3/uL (ref 0.1–1.0)
Monocytes Relative: 12.3 % — ABNORMAL HIGH (ref 3.0–12.0)
Neutro Abs: 2.3 10*3/uL (ref 1.4–7.7)
Neutrophils Relative %: 54.8 % (ref 43.0–77.0)
Platelets: 159 10*3/uL (ref 150.0–400.0)
RBC: 5.11 Mil/uL (ref 4.22–5.81)
RDW: 14.2 % (ref 11.5–15.5)
WBC: 4.2 10*3/uL (ref 4.0–10.5)

## 2021-01-07 LAB — COMPREHENSIVE METABOLIC PANEL
ALT: 39 U/L (ref 0–53)
AST: 31 U/L (ref 0–37)
Albumin: 4.5 g/dL (ref 3.5–5.2)
Alkaline Phosphatase: 58 U/L (ref 39–117)
BUN: 17 mg/dL (ref 6–23)
CO2: 25 mEq/L (ref 19–32)
Calcium: 9.3 mg/dL (ref 8.4–10.5)
Chloride: 103 mEq/L (ref 96–112)
Creatinine, Ser: 1.16 mg/dL (ref 0.40–1.50)
GFR: 70.93 mL/min (ref >60.00)
Glucose, Bld: 99 mg/dL (ref 70–99)
Potassium: 4.4 mEq/L (ref 3.5–5.1)
Sodium: 137 mEq/L (ref 135–145)
Total Bilirubin: 1.5 mg/dL — ABNORMAL HIGH (ref 0.2–1.2)
Total Protein: 7.1 g/dL (ref 6.0–8.3)

## 2021-01-07 LAB — MICROALBUMIN / CREATININE URINE RATIO
Creatinine,U: 228.9 mg/dL
Microalb Creat Ratio: 0.3 mg/g (ref 0.0–30.0)
Microalb, Ur: <0.7 mg/dL (ref 0.0–1.9)

## 2021-01-07 LAB — TESTOSTERONE: Testosterone: 138.72 ng/dL — ABNORMAL LOW (ref 300.00–890.00)

## 2021-01-07 LAB — PSA: PSA: 1.57 ng/mL (ref 0.10–4.00)

## 2021-01-07 LAB — TSH: TSH: 3.04 u[IU]/mL (ref 0.35–5.50)

## 2021-01-07 LAB — HEMOGLOBIN A1C: Hgb A1c MFr Bld: 6 % (ref 4.6–6.5)

## 2021-01-12 ENCOUNTER — Other Ambulatory Visit: Payer: Self-pay | Admitting: Family Medicine

## 2021-01-14 ENCOUNTER — Encounter: Payer: Self-pay | Admitting: Family Medicine

## 2021-01-14 ENCOUNTER — Other Ambulatory Visit: Payer: Self-pay

## 2021-01-14 ENCOUNTER — Ambulatory Visit: Payer: 59 | Admitting: Family Medicine

## 2021-01-14 VITALS — BP 118/70 | HR 70 | Temp 98.3°F | Ht 70.0 in | Wt 217.7 lb

## 2021-01-14 DIAGNOSIS — R17 Unspecified jaundice: Secondary | ICD-10-CM

## 2021-01-14 DIAGNOSIS — R7989 Other specified abnormal findings of blood chemistry: Secondary | ICD-10-CM | POA: Diagnosis not present

## 2021-01-14 DIAGNOSIS — G4733 Obstructive sleep apnea (adult) (pediatric): Secondary | ICD-10-CM

## 2021-01-14 DIAGNOSIS — M545 Low back pain, unspecified: Secondary | ICD-10-CM

## 2021-01-14 MED ORDER — TESTOSTERONE CYPIONATE 200 MG/ML IM SOLN
200.0000 mg | Freq: Once | INTRAMUSCULAR | Status: AC
  Administered 2021-01-14: 200 mg via INTRAMUSCULAR

## 2021-01-14 NOTE — Addendum Note (Signed)
Addended by: Agnes Lawrence on: 01/14/2021 09:54 AM   Modules accepted: Orders

## 2021-01-14 NOTE — Progress Notes (Signed)
Walter Jordan. DOB: December 13, 1965 Encounter date: 01/14/2021  This is a 55 y.o. male who presents with Chief Complaint  Patient presents with   Follow-up     History of present illness: Last visit with me was 07/16/2020.  He was referred to sports medicine at that time for some back pain. Didn't feel much change after a couple of visits.   Has spot in back that hurts when he eats - feels like spasm - notes on left side reaching or after eating. Feels like tight pain - if he stands or sits puts pressure on it and that will help it relieve. This has been going on for a couple of months. Doesn't seem to be triggered by a specific food; wondering if it is positional. No heart burn/acid reflux. Doesn't bother as much at night. Does have to change positions at night due to hip pain, leg numbness (bilat) but this has gone on for awhile.   On testosterone: last levels were low- he is getting monthly shots.  Type 2 diabetes: Has been managed with diet, losartan 25 mg daily for renal protection Hypothyroid: 50 mcg daily Uses cpap most of the time.   No Known Allergies Current Meds  Medication Sig   alfuzosin (UROXATRAL) 10 MG 24 hr tablet Take 10 mg by mouth daily.   cyclobenzaprine (FLEXERIL) 10 MG tablet TAKE 1 TABLET BY MOUTH THREE TIMES A DAY AS NEEDED FOR MUSCLE SPASMS   gabapentin (NEURONTIN) 100 MG capsule Take 2 capsules (200 mg total) by mouth at bedtime.   HYDROcodone-acetaminophen (NORCO) 10-325 MG tablet Take 1 tablet by mouth 2 (two) times daily as needed.   levothyroxine (SYNTHROID) 50 MCG tablet TAKE 1 TABLET BY MOUTH EVERY DAY BEFORE BREAKFAST   losartan (COZAAR) 25 MG tablet TAKE 1 TABLET BY MOUTH EVERY DAY   Multiple Vitamins-Minerals (MEGA MULTIVITAMIN FOR MEN PO) Take by mouth daily.   testosterone cypionate (DEPOTESTOSTERONE CYPIONATE) 200 MG/ML injection INJECT 1 ML (200 MG TOTAL) INTO THE MUSCLE EVERY 28 (TWENTY-EIGHT) DAYS.   Current Facility-Administered Medications for  the 01/14/21 encounter (Office Visit) with Caren Macadam, MD  Medication   0.9 %  sodium chloride infusion    Review of Systems  Constitutional:  Negative for chills, fatigue and fever.  Respiratory:  Negative for cough, chest tightness, shortness of breath and wheezing.   Cardiovascular:  Negative for chest pain, palpitations and leg swelling.  Musculoskeletal:  Positive for back pain (right paralumbar spasm).   Objective:  BP 118/70 (BP Location: Left Arm, Patient Position: Sitting, Cuff Size: Large)   Pulse 70   Temp 98.3 F (36.8 C) (Oral)   Ht '5\' 10"'$  (1.778 m)   Wt 217 lb 11.2 oz (98.7 kg)   SpO2 98%   BMI 31.24 kg/m   Weight: 217 lb 11.2 oz (98.7 kg)   BP Readings from Last 3 Encounters:  01/14/21 118/70  08/26/20 108/80  07/28/20 130/90   Wt Readings from Last 3 Encounters:  01/14/21 217 lb 11.2 oz (98.7 kg)  08/26/20 214 lb (97.1 kg)  07/28/20 222 lb (100.7 kg)    Physical Exam Constitutional:      General: He is not in acute distress.    Appearance: He is well-developed.  Cardiovascular:     Rate and Rhythm: Normal rate and regular rhythm.     Heart sounds: Normal heart sounds. No murmur heard.   No friction rub.  Pulmonary:     Effort: Pulmonary effort is normal.  No respiratory distress.     Breath sounds: Normal breath sounds. No wheezing or rales.  Abdominal:     General: Abdomen is flat. Bowel sounds are normal.     Palpations: Abdomen is soft.     Tenderness: There is no abdominal tenderness.  Musculoskeletal:     Right lower leg: No edema.     Left lower leg: No edema.     Comments: No spinal tenderness to palpation. There is paralumbar spasm upper lumbar. No rib tenderness. No cva tenderness.   Neurological:     Mental Status: He is alert and oriented to person, place, and time.  Psychiatric:        Behavior: Behavior normal.    Assessment/Plan 1. Obstructive sleep apnea Continue with mask; may need reassessment as he has lost weight  and reports improvement in sleep quality.    2. Low testosterone in male Levels low; may need to adjust frequency of injection. We will check levels at 2-3 week timing after injection to get idea of how to better set up to keep him in normal range.  - Testosterone; Future  3. Elevated bilirubin Mild; will recheck. - Hepatic function panel; Future  4. Lumbar pain Does not want surgery. MRI last year 07/2019 reviewed. Mutilevel lumbar arthritis. If pt note helpful in 4-6 weeks or worsening let me know. - Ambulatory referral to Physical Therapy  Return for bloodwork in 2-3 weeks.      Micheline Rough, MD

## 2021-01-28 ENCOUNTER — Other Ambulatory Visit: Payer: Self-pay | Admitting: Family Medicine

## 2021-01-28 ENCOUNTER — Other Ambulatory Visit (INDEPENDENT_AMBULATORY_CARE_PROVIDER_SITE_OTHER): Payer: 59

## 2021-01-28 ENCOUNTER — Other Ambulatory Visit: Payer: Self-pay

## 2021-01-28 DIAGNOSIS — G8929 Other chronic pain: Secondary | ICD-10-CM

## 2021-01-28 DIAGNOSIS — R7989 Other specified abnormal findings of blood chemistry: Secondary | ICD-10-CM

## 2021-01-28 DIAGNOSIS — R17 Unspecified jaundice: Secondary | ICD-10-CM | POA: Diagnosis not present

## 2021-01-28 DIAGNOSIS — E349 Endocrine disorder, unspecified: Secondary | ICD-10-CM

## 2021-01-28 LAB — HEPATIC FUNCTION PANEL
ALT: 28 U/L (ref 0–53)
AST: 22 U/L (ref 0–37)
Albumin: 4.3 g/dL (ref 3.5–5.2)
Alkaline Phosphatase: 55 U/L (ref 39–117)
Bilirubin, Direct: 0.1 mg/dL (ref 0.0–0.3)
Total Bilirubin: 0.7 mg/dL (ref 0.2–1.2)
Total Protein: 6.9 g/dL (ref 6.0–8.3)

## 2021-01-28 LAB — TESTOSTERONE: Testosterone: 381.48 ng/dL (ref 300.00–890.00)

## 2021-01-30 MED ORDER — HYDROCODONE-ACETAMINOPHEN 10-325 MG PO TABS: 1.0000 | ORAL_TABLET | Freq: Two times a day (BID) | ORAL | 0 refills | Status: DC | PRN

## 2021-01-31 ENCOUNTER — Encounter: Payer: Self-pay | Admitting: Family Medicine

## 2021-02-01 NOTE — Addendum Note (Signed)
Addended by: Agnes Lawrence on: 02/01/2021 08:50 AM   Modules accepted: Orders

## 2021-02-09 ENCOUNTER — Other Ambulatory Visit: Payer: Self-pay

## 2021-02-09 ENCOUNTER — Encounter: Payer: Self-pay | Admitting: Physical Therapy

## 2021-02-09 ENCOUNTER — Ambulatory Visit: Payer: 59 | Attending: Family Medicine | Admitting: Physical Therapy

## 2021-02-09 DIAGNOSIS — M5416 Radiculopathy, lumbar region: Secondary | ICD-10-CM | POA: Insufficient documentation

## 2021-02-09 DIAGNOSIS — R293 Abnormal posture: Secondary | ICD-10-CM | POA: Insufficient documentation

## 2021-02-09 DIAGNOSIS — R252 Cramp and spasm: Secondary | ICD-10-CM | POA: Diagnosis present

## 2021-02-09 DIAGNOSIS — M545 Low back pain, unspecified: Secondary | ICD-10-CM | POA: Diagnosis not present

## 2021-02-09 DIAGNOSIS — G8929 Other chronic pain: Secondary | ICD-10-CM | POA: Insufficient documentation

## 2021-02-09 NOTE — Therapy (Signed)
[facility] [address], [place], [place], [address] Phone: [phone]   Fax:  [phone]  Physical Therapy Evaluation  Patient Details  Name: Walter Jordan. MRN: [id number] Date of Birth: 04-11-1966 Referring Provider (PT): Caren Macadam, MD   Encounter Date: 02/09/2021   PT End of Session - 02/09/21 1024     Visit Number 1    Date for PT Re-Evaluation 04/06/21    Authorization Type [employer]    Authorization - Number of Visits 40    PT Start Time 0845    PT Stop Time 0922    PT Time Calculation (min) 37 min    Activity Tolerance Patient tolerated treatment well    Behavior During Therapy [facility] for tasks assessed/performed             Past Medical History:  Diagnosis Date   Allergic rhinitis    Allergy    Arthritis    back   ED (erectile dysfunction)    Elevated lipase    Enlarged prostate    GERD (gastroesophageal reflux disease)    Hepatic steatosis    Hypothyroidism    Low back pain    Low testosterone    Sleep apnea    had sleep study 09-10-16- needs new cpap    Past Surgical History:  Procedure Laterality Date   COLONOSCOPY  03/19/2008; 2018   ext. hems    HERNIA REPAIR     x2 1968, inguinal [place] EXTRACTION      There were no vitals filed for this visit.    Subjective Assessment - 02/09/21 0852     Subjective Pt reports Rt side thoracic spinal pain with reaching or overhead activities but also has spasms at random times without activity has been going on for at least 5-6 months    Pertinent History --    How long can you sit comfortably? no limits    How long can you stand comfortably? no limits    How long can you walk comfortably? no limits but with running with dogs does have sciatic type pain in Lt leg    Diagnostic tests disc bulge with moderate spinal stenosis L2-L3, Rt extra foraminal disc protrusion L3-L4 with potentially effecting L3 nerve root, moderate to  severe bil L5 foraminal stenosis, chronic degenerative disc L5-S1, Lt severe foraminal stenosis and moderate to severe Lt lateral narrowing with potental L4 or L5 nerve root being affected. Overall, these changes are progressed relative to 2016    Patient Stated Goals to have less pain    Currently in Pain? No/denies                [facility] PT Assessment - 02/09/21 0001       Assessment   Medical Diagnosis M54.50 (ICD-10-CM) - Lumbar pain    Referring Provider (PT) Caren Macadam, MD    Onset Date/Surgical Date --   at least 5 months ago   Hand Dominance Left    Prior Therapy "long time ago"      Precautions   Precautions Fall      Restrictions   Weight Bearing Restrictions No      Balance Screen   Has the patient fallen in the past 6 months No    Has the patient had a decrease in activity level because of a fear of falling?  No    Is the patient reluctant to leave their home because of  a fear of falling?  No      Home Ecologist residence    Living Arrangements Spouse/significant other    Type of [place]      Prior Function   Level of [place] Full time employment    Field seismologist    Leisure motorcycles and shooting at gun range      Cognition   Overall Cognitive Status Within Functional Limits for tasks assessed      Observation/Other Assessments   Focus on Therapeutic Outcomes (FOTO)  68>71      Sensation   Light Touch Appears Intact    Additional Comments does report mild tingling with running with dogs in Lt leg      Coordination   Gross Motor Movements are Fluid and Coordinated Yes    Fine Motor Movements are Fluid and Coordinated Yes      Posture/Postural Control   Posture/Postural Control Postural limitations    Postural Limitations Rounded Shoulders;Increased thoracic kyphosis;Decreased lumbar lordosis;Posterior pelvic tilt      ROM / Strength   AROM / PROM /  Strength AROM;Strength      AROM   Overall AROM Comments spinal ROM limited in thoracic spine by 75% in rotation and side bending; 50% in flexion and extension. Pt also required use of bil UE to return to upright posture from flexion.Lumbar spine with same limitations, also of note lumbar lordosis limited in resting posture and pt unable to increase without manual assist and max cues.      Strength   Overall Strength Comments bil LE 5/5 throughout      Flexibility   Soft Tissue Assessment /Muscle Length yes    Hamstrings hamstrings and adductors bil limited by 50%      Palpation   Spinal mobility decreased thoracic mobility from T1-T10 in all directions with severe muscle tension noted bil but Rt>Lt. T9 rotated and side bent to Lt; L2 flexed and appears to side bend to Rt; and pelvis with Lt side anteriorly rotated. rib mobility decreased throughout    SI assessment  no tenderness per pt however Lt anteriorly rotated and Rt posteriorly rotated.    Palpation comment mild TTP at bil L4 and L5 and at general Rt paraspinals at thoracic      Special Tests    Special Tests Hip Special Tests    Other special tests straight leg test increased tightness at Rt thoracic paraspinals with Rt leg but (-) testing    Hip Special Tests  SI Compression;SI Distraction;Patrick (FABER) Test      Saralyn Pilar (FABER) Test   Side Right    Comments tightness noted per pt returned at Rt thoracic paraspinal      SI Compression   Findings Negative    Side Right      SI Distraction   Findings  Negative    Side Right                        Objective measurements completed on examination: See above findings.                PT Education - 02/09/21 1023     Education Details pt educated on exam findings, POC, HEP and dry needling.    Person(s) Educated Patient    Methods Explanation;Demonstration;Tactile cues;Verbal cues;Handout    Comprehension Verbalized understanding;Returned  demonstration  PT Short Term Goals - 02/09/21 1033       PT SHORT TERM GOAL #1   Title Pt to be I with HEP    Time 4    Period Weeks    Status New    Target Date 03/09/21      PT SHORT TERM GOAL #2   Title Pt to demonstrate improved spinal mobility in all directions limited no more than 25%    Time 4    Period Weeks    Status New    Target Date 03/09/21      PT SHORT TERM GOAL #3   Title pt to report no more than 4/10 pain with mobility in Rt spine to improve functional mobility and QOL    Time 4    Period Weeks    Status New    Target Date 03/09/21      PT SHORT TERM GOAL #4   Title pt to demonstrate good lifiting mechanics and Lumbopelvic rhythm of at least 15# from ground to decrease risk of injury    Time 4    Period Weeks    Status New    Target Date 03/09/21               PT Long Term Goals - 02/09/21 1036       PT LONG TERM GOAL #1   Title Pt to be I with advanced HEP    Time 8    Period Weeks    Status New    Target Date 04/06/21      PT LONG TERM GOAL #2   Title Pt to demonstrate improved FOTO score to at least 71 for improved pt perceived function.    Time 8    Period Weeks    Status New    Target Date 04/06/21      PT LONG TERM GOAL #3   Title pt to report no more than 2/10 pain at Rt thoracic region with mobility to improve QOL    Time 8    Period Weeks    Status New    Target Date 04/06/21      PT LONG TERM GOAL #4   Title Pt to demonstrate improved spinal moblity WFL in thoracic and lumbar spine to improve mobility and decrease risk of injury with activity    Time 8    Period Weeks    Status New    Target Date 04/06/21                    Plan - 02/09/21 1026     Clinical Impression Statement Pt is 55yo male presenting to clinic with 5-6 month history of Rt side thoracic paraspinal pain, fairly constant with "nagging pain" but does have relief at rest most of the time, worse with Rt UE mobility,  reaching, however pt does report random spasms that are very painful as well without pattern. Pt also reports mild Lt leg tingling with running with dogs and some sciatic pain with this. Pt has had MRI with results: disc bulge with moderate spinal stenosis L2-L3, Rt extra foraminal disc protrusion L3-L4 with potentially effecting L3 nerve root, moderate to severe bil L5 foraminal stenosis, chronic degenerative disc L5-S1, Lt severe foraminal stenosis and moderate to severe Lt lateral narrowing with potental L4 or L5 nerve root being affected. Overall, these changes are progressed relative to 2016 per chart review. Pt found to have ROM deficits in all directions in thoracic  and lumbar spine, has decreased lumbar lordosis, pain with Rt and Lt side bending, rotation, flexion and extension all at Rt thoracic paraspinals. Pt found to have profound tightness in bil thoracic paraspinals Rt>Lt, very limited mobility at spinal segments in thoracic spine and lumbar spine. Pt would benefit from continued PT to address deficits.    Personal Factors and Comorbidities Time since onset of injury/illness/exacerbation;Fitness    Scientist, research (physical sciences);Reach Overhead    Stability/Clinical Decision Making Stable/Uncomplicated    Clinical Decision Making Low    Rehab Potential Good    PT Frequency 1x / week    PT Duration 8 weeks    PT Treatment/Interventions ADLs/Self Care Home Management;Aquatic Therapy;Moist Heat;Stair training;Functional mobility training;Therapeutic activities;Therapeutic exercise;Balance training;Neuromuscular re-education;Manual techniques;Patient/family education;Passive range of motion;Dry needling;Energy conservation;Joint Manipulations;Spinal Manipulations;Taping    PT Next Visit Plan manual work and stretching at spine    PT Home Exercise Plan Access Code [email or web address]    Consulted and Agree with Plan of Care Patient             Patient will benefit from skilled  therapeutic intervention in order to improve the following deficits and impairments:  Decreased range of motion, Increased fascial restricitons, Increased muscle spasms, Pain, Impaired flexibility, Improper body mechanics, Postural dysfunction, Decreased mobility, Hypomobility  Visit Diagnosis: Chronic right-sided low back pain without sciatica - Plan: PT plan of care cert/re-cert  Cramp and spasm - Plan: PT plan of care cert/re-cert  Abnormal posture - Plan: PT plan of care cert/re-cert     Problem List Patient Active Problem List   Diagnosis Date Noted   Nonallopathic lesion of lumbar region 07/28/2020   Nonallopathic lesion of sacral region 07/28/2020   Nonallopathic lesion of thoracic region 07/28/2020   Impaired glucose tolerance 12/04/2019   Low testosterone in male 06/11/2019   Controlled type 2 diabetes mellitus without complication, without long-term current use of insulin ([place]) 06/11/2019   Pain in left hand 03/28/2019   Hx of adenomatous colonic polyps 10/25/2017   Lumbar radiculopathy 05/11/2016   VERTIGO 02/22/2010   Obstructive sleep apnea 09/27/2009   ERECTILE DYSFUNCTION, ORGANIC 09/07/2009   CHEST PAIN 03/13/2008   OTHER SYMPTOMS INVOLVING DIGESTIVE SYSTEM OTHER 03/12/2008   RECTAL BLEEDING, HX OF 03/12/2008   Hypothyroidism 02/22/2007   Allergic rhinitis 02/22/2007   GERD 02/22/2007   LOW BACK PAIN 02/22/2007    Stacy Gardner, PT, DPT 02/09/2209:43 AM    [facility] [address], [place], [place], [address] Phone: [phone]   Fax:  [phone]  Name: Hagop Mccollam. MRN: [id number] Date of Birth: [birth year]

## 2021-02-09 NOTE — Patient Instructions (Signed)

## 2021-02-16 ENCOUNTER — Encounter: Payer: Self-pay | Admitting: Physical Therapy

## 2021-02-16 ENCOUNTER — Other Ambulatory Visit: Payer: Self-pay

## 2021-02-16 ENCOUNTER — Ambulatory Visit: Payer: 59 | Admitting: Physical Therapy

## 2021-02-16 DIAGNOSIS — G8929 Other chronic pain: Secondary | ICD-10-CM

## 2021-02-16 DIAGNOSIS — R252 Cramp and spasm: Secondary | ICD-10-CM

## 2021-02-16 DIAGNOSIS — M545 Low back pain, unspecified: Secondary | ICD-10-CM | POA: Diagnosis not present

## 2021-02-16 DIAGNOSIS — M5416 Radiculopathy, lumbar region: Secondary | ICD-10-CM

## 2021-02-16 NOTE — Therapy (Addendum)
[facility] [address], [place], [place], [address] Phone: [phone]   Fax:  [phone]  Physical Therapy Treatment  Patient Details  Name: Walter Jordan. [name] Date of Birth: 28-Jul-1965 Referring Provider (PT): Caren Macadam, MD   Encounter Date: 02/16/2021   PT End of Session - 02/16/21 1410     Visit Number 2    Date for PT Re-Evaluation 04/06/21    Authorization Type U[employer]    PT Start Time 2297    PT Stop Time 1440    PT Time Calculation (min) 38 min    Activity Tolerance Patient tolerated treatment well    Behavior During Therapy WFL for tasks assessed/performed             Past Medical History:  Diagnosis Date   Allergic rhinitis    Allergy    Arthritis    back   ED (erectile dysfunction)    Elevated lipase    Enlarged prostate    GERD (gastroesophageal reflux disease)    Hepatic steatosis    Hypothyroidism    Low back pain    Low testosterone    Sleep apnea    had sleep study 09-10-16- needs new cpap    Past Surgical History:  Procedure Laterality Date   COLONOSCOPY  03/19/2008; 2018   ext. hems    HERNIA REPAIR     x2 1968, inguinal [place] EXTRACTION      There were no vitals filed for this visit.                      [facility] PT Treatment/Exercise - 02/16/21 0001       Lumbar Exercises: Stretches   Other Lumbar Stretch Exercise Thoracic towel roll in supine with diaphgramatic breathing: Vc to use something softer at home vs hard roll to decrease soreness      Lumbar Exercises: Supine   Other Supine Lumbar Exercises Blue band horizontal abd 15x: pt already does this movement at th egym      Lumbar Exercises: Quadruped   [place] 10 reps    Other Quadruped Lumbar Exercises Childs pose with back body breathing      Manual Therapy   Manual Therapy Soft tissue mobilization    Soft tissue mobilization Pt prone; soft  tisue work Rt thoracic                       PT Short Term Goals - 02/09/21 1033       PT SHORT TERM GOAL #1   Title Pt to be I with HEP    Time 4    Period Weeks    Status New    Target Date 03/09/21      PT SHORT TERM GOAL #2   Title Pt to demonstrate improved spinal mobility in all directions limited no more than 25%    Time 4    Period Weeks    Status New    Target Date 03/09/21      PT SHORT TERM GOAL #3   Title pt to report no more than 4/10 pain with mobility in Rt spine to improve functional mobility and QOL    Time 4    Period Weeks    Status New    Target Date 03/09/21      PT SHORT TERM GOAL #4   Title pt to demonstrate  good lifiting mechanics and Lumbopelvic rhythm of at least 15# from ground to decrease risk of injury    Time 4    Period Weeks    Status New    Target Date 03/09/21               PT Long Term Goals - 02/09/21 1036       PT LONG TERM GOAL #1   Title Pt to be I with advanced HEP    Time 8    Period Weeks    Status New    Target Date 04/06/21      PT LONG TERM GOAL #2   Title Pt to demonstrate improved FOTO score to at least 71 for improved pt perceived function.    Time 8    Period Weeks    Status New    Target Date 04/06/21      PT LONG TERM GOAL #3   Title pt to report no more than 2/10 pain at Rt thoracic region with mobility to improve QOL    Time 8    Period Weeks    Status New    Target Date 04/06/21      PT LONG TERM GOAL #4   Title Pt to demonstrate improved spinal moblity WFL in thoracic and lumbar spine to improve mobility and decrease risk of injury with activity    Time 8    Period Weeks    Status New    Target Date 04/06/21                   Plan - 02/16/21 1418     Clinical Impression Statement Pt arrives with very little pain. Soft tissues fairly supple at rest, the RT scapula/throacic area had moderate tension at rest but softened fairly quickly. Pt's Rt back rib area did not  move initially during childs pose stretch. This improved with practice. Pt reports he was using a hard roll at home for the thoracic stretch and this made him pretty sore. PTA suggested trying a softer towel roll instead to see if this mde him less sore. He verbally agreed.    Personal Factors and Comorbidities Time since onset of injury/illness/exacerbation;Fitness    Scientist, research (physical sciences);Reach Overhead    Stability/Clinical Decision Making Stable/Uncomplicated    Rehab Potential Good    PT Frequency 1x / week    PT Duration 8 weeks    PT Treatment/Interventions ADLs/Self Care Home Management;Aquatic Therapy;Moist Heat;Stair training;Functional mobility training;Therapeutic activities;Therapeutic exercise;Balance training;Neuromuscular re-education;Manual techniques;Patient/family education;Passive range of motion;Dry needling;Energy conservation;Joint Manipulations;Spinal Manipulations;Taping    PT Next Visit Plan manual work and stretching at spine: pt is interested in dry needling    PT Home Exercise Plan Access Code [email or web address]    Consulted and Agree with Plan of Care Patient             Patient will benefit from skilled therapeutic intervention in order to improve the following deficits and impairments:  Decreased range of motion, Increased fascial restricitons, Increased muscle spasms, Pain, Impaired flexibility, Improper body mechanics, Postural dysfunction, Decreased mobility, Hypomobility  Visit Diagnosis: Chronic right-sided low back pain without sciatica  Cramp and spasm  Radiculopathy, lumbar region     Problem List Patient Active Problem List   Diagnosis Date Noted   Nonallopathic lesion of lumbar region 07/28/2020   Nonallopathic lesion of sacral region 07/28/2020   Nonallopathic lesion of thoracic region 07/28/2020   Impaired glucose tolerance 12/04/2019   Low testosterone in  male 06/11/2019   Controlled type 2 diabetes mellitus without  complication, without long-term current use of insulin ([place]) 06/11/2019   Pain in left hand 03/28/2019   Hx of adenomatous colonic polyps 10/25/2017   Lumbar radiculopathy 05/11/2016   VERTIGO 02/22/2010   Obstructive sleep apnea 09/27/2009   ERECTILE DYSFUNCTION, ORGANIC 09/07/2009   CHEST PAIN 03/13/2008   OTHER SYMPTOMS INVOLVING DIGESTIVE SYSTEM OTHER 03/12/2008   RECTAL BLEEDING, HX OF 03/12/2008   Hypothyroidism 02/22/2007   Allergic rhinitis 02/22/2007   GERD 02/22/2007   LOW BACK PAIN 02/22/2007    Micheline Markes, PTA 02/16/2021, 2:54 PM   PHYSICAL THERAPY DISCHARGE SUMMARY  Visits from Start of Care: 2  Current functional level related to goals / functional outcomes: Unable to formally reassess as pt ha not returned since last/this appointment. (02/16/21).   Remaining deficits: Unable to formally reassess   Education / Equipment: HEP   Patient agrees to discharge. Patient goals were not met. Patient is being discharged due to not returning since the last visit.    [facility] [address] [place], [place], [address] Phone: [phone]   Fax:  [phone]  Name: Walter Jordan. MRN: [id number] Date of Birth: 1966-03-05

## 2021-02-24 ENCOUNTER — Ambulatory Visit (INDEPENDENT_AMBULATORY_CARE_PROVIDER_SITE_OTHER): Payer: 59 | Admitting: *Deleted

## 2021-02-24 ENCOUNTER — Other Ambulatory Visit: Payer: Self-pay

## 2021-02-24 ENCOUNTER — Other Ambulatory Visit: Payer: Self-pay | Admitting: Family Medicine

## 2021-02-24 DIAGNOSIS — R7989 Other specified abnormal findings of blood chemistry: Secondary | ICD-10-CM

## 2021-02-24 MED ORDER — TESTOSTERONE CYPIONATE 200 MG/ML IM SOLN
200.0000 mg | INTRAMUSCULAR | Status: DC
  Administered 2021-02-24: 200 mg via INTRAMUSCULAR

## 2021-02-24 NOTE — Progress Notes (Signed)
Per orders of Dr. Jerilee Hoh, injection of Testosterone Cypionate 200mg  given by Agnes Lawrence. Patient tolerated injection well.

## 2021-02-25 NOTE — Progress Notes (Signed)
Per orders of Dr. Hernandez, injection of Testosterone cypionate 200mg given by Meghen Akopyan A. Patient tolerated injection well.  

## 2021-03-01 ENCOUNTER — Other Ambulatory Visit: Payer: Self-pay | Admitting: Family Medicine

## 2021-03-09 ENCOUNTER — Telehealth: Payer: Self-pay

## 2021-03-09 ENCOUNTER — Ambulatory Visit: Payer: 59 | Attending: Family Medicine

## 2021-03-09 DIAGNOSIS — R252 Cramp and spasm: Secondary | ICD-10-CM | POA: Insufficient documentation

## 2021-03-09 DIAGNOSIS — R293 Abnormal posture: Secondary | ICD-10-CM | POA: Insufficient documentation

## 2021-03-09 DIAGNOSIS — M5416 Radiculopathy, lumbar region: Secondary | ICD-10-CM | POA: Insufficient documentation

## 2021-03-09 DIAGNOSIS — G8929 Other chronic pain: Secondary | ICD-10-CM | POA: Insufficient documentation

## 2021-03-09 DIAGNOSIS — M545 Low back pain, unspecified: Secondary | ICD-10-CM | POA: Insufficient documentation

## 2021-03-09 NOTE — Telephone Encounter (Signed)
Pt had a no-show for appt today.  Not able to leave a voicemail because mailbox is full.

## 2021-03-15 ENCOUNTER — Encounter: Payer: Self-pay | Admitting: Family Medicine

## 2021-03-17 ENCOUNTER — Other Ambulatory Visit: Payer: Self-pay | Admitting: Family Medicine

## 2021-03-17 MED ORDER — TESTOSTERONE CYPIONATE 200 MG/ML IM SOLN: 200.0000 mg | INTRAMUSCULAR | 1 refills | Status: DC

## 2021-03-18 NOTE — Telephone Encounter (Signed)
I spoke with the pt and NV was scheduled for 03/22/2021 at 10:45 for Testosterone injection.

## 2021-03-21 ENCOUNTER — Other Ambulatory Visit: Payer: Self-pay

## 2021-03-21 ENCOUNTER — Ambulatory Visit (INDEPENDENT_AMBULATORY_CARE_PROVIDER_SITE_OTHER): Payer: 59 | Admitting: *Deleted

## 2021-03-21 DIAGNOSIS — R7989 Other specified abnormal findings of blood chemistry: Secondary | ICD-10-CM

## 2021-03-21 MED ORDER — TESTOSTERONE CYPIONATE 200 MG/ML IM SOLN
200.0000 mg | Freq: Once | INTRAMUSCULAR | Status: AC
  Administered 2021-03-21: 200 mg via INTRAMUSCULAR

## 2021-03-21 NOTE — Progress Notes (Signed)
Per orders of Dr. Elease Hashimoto, injection of Testosterone cypionate 200mg  given by Agnes Lawrence. Patient tolerated injection well.

## 2021-03-23 ENCOUNTER — Ambulatory Visit: Payer: 59 | Admitting: Family Medicine

## 2021-03-23 ENCOUNTER — Other Ambulatory Visit: Payer: Self-pay

## 2021-03-23 VITALS — BP 130/70 | HR 78 | Temp 98.7°F | Wt 221.1 lb

## 2021-03-23 DIAGNOSIS — R1310 Dysphagia, unspecified: Secondary | ICD-10-CM | POA: Diagnosis not present

## 2021-03-23 MED ORDER — PANTOPRAZOLE SODIUM 40 MG PO TBEC: 40.0000 mg | DELAYED_RELEASE_TABLET | Freq: Every day | ORAL | 3 refills | Status: DC

## 2021-03-23 NOTE — Patient Instructions (Signed)
Start the Protonix 40 mg once daily  I am setting up GI referral to further assess.

## 2021-03-23 NOTE — Progress Notes (Signed)
Established Patient Office Visit  Subjective:  Patient ID: Walter Jordan., male    DOB: 1965/06/23  Age: 55 y.o. MRN: [id number]  CC:  Chief Complaint  Patient presents with   Pain    Pain when swallowing, patient states the pain radiates down the middle of the chest sometimes when he swallows but not every time.     HPI Walter Jordan. presents for 4 to 6-week history of pain with swallowing.  Pain is substernal region.  Denies any obstructive symptoms.  Symptoms somewhat inconsistent.  No active GERD symptoms.  He is a non-smoker.  Rare alcohol use.  Denies recent weight loss.  No appetite changes.  No recent nausea or vomiting.  No exertional symptoms.  Denies any history of significant esophagitis.  No alleviating factors.  He has not tried any antacids.  No recent stool changes.  No melena.  Past Medical History:  Diagnosis Date   Allergic rhinitis    Allergy    Arthritis    back   ED (erectile dysfunction)    Elevated lipase    Enlarged prostate    GERD (gastroesophageal reflux disease)    Hepatic steatosis    Hypothyroidism    Low back pain    Low testosterone    Sleep apnea    had sleep study 09-10-16- needs new cpap    Past Surgical History:  Procedure Laterality Date   COLONOSCOPY  03/19/2008; 2018   ext. hems    HERNIA REPAIR     x2 1968, inguinal 1973   WISDOM TOOTH EXTRACTION      Family History  Problem Relation Age of Onset   Cancer Father        sarcoma- both kidneys removed   Diabetes Father        s/p renal transplant   Arthritis Mother    High blood pressure Mother    High Cholesterol Mother    Heart disease Mother    Diabetes Brother    Diabetes Maternal Grandmother    Diabetes Paternal Grandmother    Cancer Paternal Grandmother        colon   Depression Other    Diabetes Other    Hypertension Other    Kidney disease Other    Obesity Sister    Stroke Maternal Grandfather 60       tobacco abuse   Alcohol abuse Maternal Grandfather     Early death Paternal Grandfather        accidental   Prostate cancer Maternal Uncle    Heart attack Maternal Uncle 60   Prostate cancer Maternal Uncle    Cancer Paternal Uncle        uncertain type   Alcohol abuse Paternal Uncle    Colon polyps Neg Hx    Rectal cancer Neg Hx    Stomach cancer Neg Hx    Esophageal cancer Neg Hx    Pancreatic cancer Neg Hx     Social History   Socioeconomic History   Marital status: Married    Spouse name: Not on file   Number of children: Not on file   Years of education: Not on file   Highest education level: Some college, no degree  Occupational History   Not on file  Tobacco Use   Smoking status: Never   Smokeless tobacco: Never  Vaping Use   Vaping Use: Never used  Substance and Sexual Activity   Alcohol use: Yes    Comment: social  Drug use: No   Sexual activity: Not on file  Other Topics Concern   Not on file  Social History Narrative   Not on file   Social Determinants of Health   Financial Resource Strain: Low Risk    Difficulty of Paying Living Expenses: Not hard at all  Food Insecurity: No Food Insecurity   Worried About Running Out of Food in the Last Year: Never true   [place] in the Last Year: Never true  Transportation Needs: No Transportation Needs   Lack of Transportation (Medical): No   Lack of Transportation (Non-Medical): No  Physical Activity: Insufficiently Active   Days of Exercise per Week: 1 day   Minutes of Exercise per Session: 90 min  Stress: Stress Concern Present   Feeling of Stress : To some extent  Social Connections: Unknown   Frequency of Communication with Friends and Family: Patient refused   Frequency of Social Gatherings with Friends and Family: Patient refused   Attends Religious Services: More than 4 times per year   Active Member of [employer] or Organizations: Yes   Attends Archivist Meetings: Never   Marital Status: Married  Human resources officer Violence: Not on file     Outpatient Medications Prior to Visit  Medication Sig Dispense Refill   alfuzosin (UROXATRAL) 10 MG 24 hr tablet Take 10 mg by mouth daily.     cyclobenzaprine (FLEXERIL) 10 MG tablet TAKE 1 TABLET BY MOUTH THREE TIMES A DAY AS NEEDED FOR MUSCLE SPASMS 90 tablet 3   gabapentin (NEURONTIN) 100 MG capsule Take 2 capsules (200 mg total) by mouth at bedtime. 180 capsule 3   HYDROcodone-acetaminophen (NORCO) 10-325 MG tablet Take 1 tablet by mouth 2 (two) times daily as needed. 60 tablet 0   levothyroxine (SYNTHROID) 50 MCG tablet TAKE 1 TABLET BY MOUTH EVERY DAY BEFORE BREAKFAST 90 tablet 1   losartan (COZAAR) 25 MG tablet TAKE 1 TABLET BY MOUTH EVERY DAY 90 tablet 1   Multiple Vitamins-Minerals (MEGA MULTIVITAMIN FOR MEN PO) Take by mouth daily.     testosterone cypionate (DEPOTESTOSTERONE CYPIONATE) 200 MG/ML injection Inject 1 mL (200 mg total) into the muscle every 21 ( twenty-one) days. 6 mL 1   Facility-Administered Medications Prior to Visit  Medication Dose Route Frequency Provider Last Rate Last Admin   0.9 %  sodium chloride infusion  500 mL Intravenous Continuous Danis, Estill Cotta III, MD        No Known Allergies  ROS Review of Systems  Constitutional:  Negative for appetite change and unexpected weight change.  HENT:  Negative for sore throat.   Respiratory:  Negative for cough and shortness of breath.   Gastrointestinal:  Negative for abdominal pain.     Objective:    Physical Exam Vitals reviewed.  Constitutional:      Appearance: Normal appearance.  HENT:     Mouth/Throat:     Mouth: Mucous membranes are moist.     Pharynx: Oropharynx is clear.  Cardiovascular:     Rate and Rhythm: Normal rate and regular rhythm.  Pulmonary:     Effort: Pulmonary effort is normal.     Breath sounds: Normal breath sounds.  Abdominal:     Palpations: Abdomen is soft. There is no mass.     Tenderness: There is no abdominal tenderness. There is no guarding or rebound.   Neurological:     Mental Status: He is alert.    BP 130/70 (BP Location: Left Arm,  Patient Position: Sitting, Cuff Size: Normal)   Pulse 78   Temp 98.7 F (37.1 C) (Oral)   Wt 221 lb 1.6 oz (100.3 kg)   SpO2 99%   BMI 31.72 kg/m  Wt Readings from Last 3 Encounters:  03/23/21 221 lb 1.6 oz (100.3 kg)  01/14/21 217 lb 11.2 oz (98.7 kg)  08/26/20 214 lb (97.1 kg)     Health Maintenance Due  Topic Date Due   Pneumococcal Vaccine 80-87 Years old (1 - PCV) Never done   FOOT EXAM  Never done   Zoster Vaccines- Shingrix (2 of 2) 01/29/2020   COVID-19 Vaccine (4 - Booster for Pfizer series) 07/29/2020   INFLUENZA VACCINE  12/20/2020    There are no preventive care reminders to display for this patient.  Lab Results  Component Value Date   TSH 3.04 01/07/2021   Lab Results  Component Value Date   WBC 4.2 01/07/2021   HGB 16.4 01/07/2021   HCT 48.7 01/07/2021   MCV 95.3 01/07/2021   PLT 159.0 01/07/2021   Lab Results  Component Value Date   NA 137 01/07/2021   K 4.4 01/07/2021   CO2 25 01/07/2021   GLUCOSE 99 01/07/2021   BUN 17 01/07/2021   CREATININE 1.16 01/07/2021   BILITOT 0.7 01/28/2021   ALKPHOS 55 01/28/2021   AST 22 01/28/2021   ALT 28 01/28/2021   PROT 6.9 01/28/2021   ALBUMIN 4.3 01/28/2021   CALCIUM 9.3 01/07/2021   GFR 70.93 01/07/2021   Lab Results  Component Value Date   CHOL 148 01/07/2021   Lab Results  Component Value Date   HDL 48.60 01/07/2021   Lab Results  Component Value Date   LDLCALC 85 01/07/2021   Lab Results  Component Value Date   TRIG 68.0 01/07/2021   Lab Results  Component Value Date   CHOLHDL 3 01/07/2021   Lab Results  Component Value Date   HGBA1C 6.0 01/07/2021      Assessment & Plan:   Problem List Items Addressed This Visit   None Visit Diagnoses     Odynophagia    -  Primary   Relevant Orders   Ambulatory referral to Gastroenterology     Patient presents with 4 to 6-week history of  odynophagia.  He does not have any red flags such as appetite loss or weight loss.  Not describing any obstructive type symptoms.  We explained differential is fairly broad.  Doubt Candida.  Could be related to reflux esophagitis but not having any active reflux symptoms.  We discussed other possibilities such as eosinophilic esophagitis.  -Start Protonix 40 mg once daily -Handout given on GERD precautions -Set up GI referral to further evaluate  Meds ordered this encounter  Medications   pantoprazole (PROTONIX) 40 MG tablet    Sig: Take 1 tablet (40 mg total) by mouth daily.    Dispense:  30 tablet    Refill:  3    Follow-up: No follow-ups on file.    Carolann Littler, MD

## 2021-04-12 ENCOUNTER — Other Ambulatory Visit: Payer: Self-pay | Admitting: Family Medicine

## 2021-04-12 DIAGNOSIS — G8929 Other chronic pain: Secondary | ICD-10-CM

## 2021-04-13 MED ORDER — HYDROCODONE-ACETAMINOPHEN 10-325 MG PO TABS: 1.0000 | ORAL_TABLET | Freq: Two times a day (BID) | ORAL | 0 refills | Status: DC | PRN

## 2021-04-21 ENCOUNTER — Ambulatory Visit (INDEPENDENT_AMBULATORY_CARE_PROVIDER_SITE_OTHER): Payer: 59

## 2021-04-21 DIAGNOSIS — R7989 Other specified abnormal findings of blood chemistry: Secondary | ICD-10-CM | POA: Diagnosis not present

## 2021-04-21 MED ORDER — TESTOSTERONE CYPIONATE 200 MG/ML IM SOLN
200.0000 mg | Freq: Once | INTRAMUSCULAR | Status: AC
  Administered 2021-04-21: 200 mg via INTRAMUSCULAR

## 2021-04-21 NOTE — Progress Notes (Signed)
Walter Jordan. is a 55 y.o. male presents to the office today for Testosterone injections per physician's orders. Original order: Testosterone injection 3 weeks. "Ok to keep interval between 2-3 weeks for injection but I would at least initially try to keep closer to 3 weeks (just not over that)" Testosterone 200mg  IM was administered right ventrogluteal today. Patient tolerated injection. Patient due for follow up labs/provider appt: Yes. Date due: 05/02/21, appt made Yes Patient next injection due: 05/12/21, appt made No. Will await results from upcoming lab.  [name]

## 2021-04-25 ENCOUNTER — Telehealth: Payer: Self-pay | Admitting: Physical Medicine and Rehabilitation

## 2021-04-25 NOTE — Telephone Encounter (Signed)
Pt calling to get an inj appt sch'd with Dr, Ernestina Patches. The best call back number is [phone].

## 2021-04-27 ENCOUNTER — Encounter: Payer: Self-pay | Admitting: Physical Medicine and Rehabilitation

## 2021-04-27 ENCOUNTER — Ambulatory Visit: Payer: 59 | Admitting: Physical Medicine and Rehabilitation

## 2021-04-27 ENCOUNTER — Other Ambulatory Visit: Payer: Self-pay

## 2021-04-27 VITALS — BP 145/87 | HR 91

## 2021-04-27 DIAGNOSIS — G8929 Other chronic pain: Secondary | ICD-10-CM

## 2021-04-27 DIAGNOSIS — M545 Low back pain, unspecified: Secondary | ICD-10-CM

## 2021-04-27 DIAGNOSIS — M47816 Spondylosis without myelopathy or radiculopathy, lumbar region: Secondary | ICD-10-CM

## 2021-04-27 DIAGNOSIS — M5416 Radiculopathy, lumbar region: Secondary | ICD-10-CM | POA: Diagnosis not present

## 2021-04-27 DIAGNOSIS — M5116 Intervertebral disc disorders with radiculopathy, lumbar region: Secondary | ICD-10-CM | POA: Diagnosis not present

## 2021-04-27 DIAGNOSIS — M7918 Myalgia, other site: Secondary | ICD-10-CM

## 2021-04-27 NOTE — Progress Notes (Signed)
Pt state lower back pain that ravels travels down his right leg and both feet. Pt state he can sleep at night due to hips pain. Pt state he has right shoulder pain and spam in the middle of his back. Pt state he takes pian meds to help ease his pain.  Numeric Pain Rating Scale and Functional Assessment Average Pain 7 Pain Right Now 3 My pain is constant, dull, tingling, and aching Pain is worse with: some activites and laying down Pain improves with: medication   In the last MONTH (on 0-10 scale) has pain interfered with the following?  1. General activity like being  able to carry out your everyday physical activities such as walking, climbing stairs, carrying groceries, or moving a chair?  Rating(7)  2. Relation with others like being able to carry out your usual social activities and roles such as  activities at home, at work and in your community. Rating(8)  3. Enjoyment of life such that you have  been bothered by emotional problems such as feeling anxious, depressed or irritable?  Rating(9)

## 2021-04-27 NOTE — Progress Notes (Signed)
Walter Jordan. - 55 y.o. male MRN [id number]  Date of birth: 1966/02/03  Office Visit Note: Visit Date: 04/27/2021 PCP: Walter Macadam, MD Referred by: Walter Macadam, MD  Subjective: Chief Complaint  Patient presents with   Right Shoulder - Pain   Lower Back - Pain   Right Leg - Pain   Left Foot - Pain   Right Foot - Pain   HPI: Walter Jordan. is a 55 y.o. male who comes in today for evaluation of chronic, worsening and severe bilateral lower back pain. Patient does reports intermittent pain to right posterior thigh region, however his most severe pain continues to be across the bilateral lower back. Patient reports lower back pain is exacerbated by movement, activity and bending over. Patient reports he is having difficulty sleeping at night due to severe pain. Patient describes pain as a constant soreness sensation, currently rates as 8 out of 10. Patient reports no treatments seem to help alleviate his pain. Patient recently attended formal physical therapy at [facility], however patient reports he did not feel these treatments were working to alleviate his pain and also reports co pays became expensive. Patient reports he has not returned to PT. Patient's lumbar MRI from 2021 exhibits moderate facet hypertrophy at L3-L4 and L4-L5, mild facet hypertrophy at L5-S1. There is moderate spinal canal stenosis noted at L2-L3.  Patient had right L5-S1 interlaminar epidural steroid injection performed by Dr. Vonzella Jordan in April 2021.  Patient reports he cannot recall at this time if that injection did help to alleviate his pain. Patient did have surgical consultation with Dr. Charlett Jordan earlier this year whom according to the patient recommended a lumbar fusion. Patient states he is not interested in having back surgery at this time.   During our visit patient also mentioned right sided thoracic back pain. Patient reports pain has been  ongoing for several weeks and is exacerbated by movement. Patient reports pain is positional and seems to be somewhat alleviated with rest.   Review of Systems  Musculoskeletal:  Positive for back pain.  Neurological:  Negative for tingling, sensory change, focal weakness and weakness.  All other systems reviewed and are negative. Otherwise per HPI.  Assessment & Plan: Visit Diagnoses:    ICD-10-CM   1. Chronic bilateral low back pain without sciatica  M54.50 Ambulatory referral to Physical Medicine Rehab   G89.29     2. Facet hypertrophy of lumbar region  M47.816     3. Lumbar radiculopathy  M54.16     4. Radiculopathy due to lumbar intervertebral disc disorder  M51.16     5. Myofascial pain syndrome  M79.18        Plan: Findings:  Chronic, worsening and severe bilateral lower back pain.  He also voices intermittent pain to right posterior thigh however his most severe pain continues to remain across bilateral lower back.  Patient continues to have excruciating pain despite good conservative therapy such as home exercise regimen rest and use of medications as needed.  We believe the next step is to perform diagnostic and hopefully therapeutic bilateral L3-L4, L4-L5 and L5-S1 facet joint injections under fluoroscopic guidance.  If patient does well with facet joint injections we did speak with him today about possibility of longer sustained pain relief with the radiofrequency ablation procedure.  We did speak with patient in detail today regarding radiofrequency ablation procedure and also provided him with educational material for him  to take home and read.  Patient continues to have right-sided thoracic back pain.  We did offer to place referral for formal physical therapy with our in-house team today.  Patient declined physical therapy at this time and states he will continue to perform home exercise regimen and would like to focus on his severe lower back pain at this time.  No red  flag symptoms noted upon exam today.   Meds & Orders: No orders of the defined types were placed in this encounter.   Orders Placed This Encounter  Procedures   Ambulatory referral to Physical Medicine Rehab    Follow-up: Return for Bilateral L3-L4, L4-L5 and L5-S1 facet joint injections.   Procedures: No procedures performed      Clinical History: MRI LUMBAR SPINE WITHOUT CONTRAST   TECHNIQUE: Multiplanar, multisequence MR imaging of the lumbar spine was performed. No intravenous contrast was administered.   COMPARISON:  Previous MRI from 07/28/2014.   FINDINGS: Segmentation: Standard. Lowest well-formed disc space labeled the L5-S1 level.   Alignment: 4 mm retrolisthesis of L5 on S1, with trace retrolisthesis of L2 on L3. Trace anterolisthesis of L3 on L4. Findings chronic and facet mediated. Straightening of the normal lumbar lordosis with underlying mild dextroscoliosis.   Vertebrae: Vertebral body height maintained without evidence acute or chronic fracture. Bone marrow signal intensity somewhat heterogeneous but within normal limits. No discrete or worrisome osseous lesions. Discogenic reactive endplate changes seen about the left aspect of the L4-5 interspace and right aspect of the L5-S1 interspace. No other abnormal marrow edema.   Conus medullaris and cauda equina: Conus extends to the L1 level. Conus and cauda equina appear normal.   Paraspinal and other soft tissues: Paraspinous soft tissues demonstrate no acute finding. T2 hyperintense simple cyst partially visualize within the interpolar left kidney. Visualized visceral structures otherwise unremarkable.   Disc levels:   L1-2: Negative interspace. Mild facet hypertrophy. No significant canal or foraminal stenosis.   L2-3: Chronic intervertebral disc space narrowing with diffuse disc bulge. Superimposed mild reactive endplate changes. Moderate facet hypertrophy. Prominence of the dorsal epidural fat.  Changes resultant moderate canal with bilateral lateral recess stenosis, greater on the left. Mild bilateral L2 foraminal narrowing.   L3-4: Chronic intervertebral disc space narrowing with mild diffuse disc bulge. There is a superimposed broad right extraforaminal disc protrusion, closely approximating the exiting right L3 nerve root (series 13, image 23). Moderate bilateral facet hypertrophy. Resultant mild spinal stenosis. Mild to moderate bilateral L3 foraminal narrowing, slightly worse on the right.   L4-5: Chronic intervertebral disc space narrowing with diffuse disc bulge and disc desiccation. Disc bulging asymmetric to the left with exuberant left-sided endplate osteophytic spurring. Superimposed moderate left worse than right facet hypertrophy. Trace joint effusion on the right. Resultant severe left foraminal stenosis with probable impingement of the left L4 nerve root. Moderate to severe left lateral recess stenosis. Central canal remains patent. Mild right foraminal narrowing noted.   L5-S1: Advanced chronic intervertebral disc space narrowing with diffuse disc bulge and disc desiccation. Extensive reactive endplate changes with marginal endplate osteophytic spurring. Mild bilateral facet hypertrophy with associated trace joint effusions. Broad-based posterior disc osteophyte contacts the descending S1 nerve roots bilaterally without frank neural impingement or displacement. Moderate to severe bilateral L5 foraminal stenosis.   IMPRESSION: 1. Left eccentric disc osteophyte and facet hypertrophy at L4-5 with resultant severe left foraminal stenosis, with moderate to severe left lateral recess narrowing. Either the left L4 or descending L5 nerve roots could be  affected. 2. Chronic degenerative disc osteophyte at L5-S1, contacting the descending S1 nerve roots bilaterally without frank neural impingement or displacement. Moderate to severe bilateral L5 foraminal stenosis  at this level. 3. Right extraforaminal disc protrusion at L3-4, potentially affecting the exiting right L3 nerve root. 4. Disc bulge with facet hypertrophy at L2-3 with resultant moderate spinal stenosis. 5. Overall, these changes are progressed relative to 2016.     Electronically Signed   By: Jeannine Boga M.D.   On: 08/17/2019 08:22   He reports that he has never smoked. He has never used smokeless tobacco.  Recent Labs    06/14/20 1013 01/07/21 0804  HGBA1C 6.5 6.0    Objective:  VS:  HT:    WT:   BMI:     BP: (!) 145/87  HR:91bpm  TEMP: ( )  RESP:  Physical Exam Vitals and nursing note reviewed.  HENT:     Head: Normocephalic and atraumatic.     Right Ear: External ear normal.     Left Ear: External ear normal.     Nose: Nose normal.     Mouth/Throat:     Mouth: Mucous membranes are moist.  Eyes:     Extraocular Movements: Extraocular movements intact.  Cardiovascular:     Rate and Rhythm: Normal rate.     Pulses: Normal pulses.  Pulmonary:     Effort: Pulmonary effort is normal.  Abdominal:     General: Abdomen is flat. There is no distension.  Musculoskeletal:        General: Tenderness present.     Cervical back: Normal range of motion.     Comments: Pt is slow to rise from seated position to standing. Concordant low back pain with facet loading, lumbar spine extension and rotation.  Strong distal strength without clonus, no pain upon palpation of greater trochanters. Sensation intact bilaterally. Walks independently, gait steady.      Skin:    General: Skin is warm and dry.     Capillary Refill: Capillary refill takes less than 2 seconds.  Neurological:     General: No focal deficit present.     Mental Status: He is alert and oriented to person, place, and time.  Psychiatric:        Mood and Affect: Mood normal.    Ortho Exam  Imaging: No results found.  Past Medical/Family/Surgical/Social History: Medications & Allergies reviewed per  EMR, new medications updated. Patient Active Problem List   Diagnosis Date Noted   Nonallopathic lesion of lumbar region 07/28/2020   Nonallopathic lesion of sacral region 07/28/2020   Nonallopathic lesion of thoracic region 07/28/2020   Impaired glucose tolerance 12/04/2019   Low testosterone in male 06/11/2019   Controlled type 2 diabetes mellitus without complication, without long-term current use of insulin ([place]) 06/11/2019   Pain in left hand 03/28/2019   Hx of adenomatous colonic polyps 10/25/2017   Lumbar radiculopathy 05/11/2016   VERTIGO 02/22/2010   Obstructive sleep apnea 09/27/2009   ERECTILE DYSFUNCTION, ORGANIC 09/07/2009   CHEST PAIN 03/13/2008   OTHER SYMPTOMS INVOLVING DIGESTIVE SYSTEM OTHER 03/12/2008   RECTAL BLEEDING, HX OF 03/12/2008   Hypothyroidism 02/22/2007   Allergic rhinitis 02/22/2007   GERD 02/22/2007   LOW BACK PAIN 02/22/2007   Past Medical History:  Diagnosis Date   Allergic rhinitis    Allergy    Arthritis    back   ED (erectile dysfunction)    Elevated lipase    Enlarged prostate  GERD (gastroesophageal reflux disease)    Hepatic steatosis    Hypothyroidism    Low back pain    Low testosterone    Sleep apnea    had sleep study 09-10-16- needs new cpap   Family History  Problem Relation Age of Onset   Cancer Father        sarcoma- both kidneys removed   Diabetes Father        s/p renal transplant   Arthritis Mother    High blood pressure Mother    High Cholesterol Mother    Heart disease Mother    Diabetes Brother    Diabetes Maternal Grandmother    Diabetes Paternal Grandmother    Cancer Paternal Grandmother        colon   Depression Other    Diabetes Other    Hypertension Other    Kidney disease Other    Obesity Sister    Stroke Maternal Grandfather 70       tobacco abuse   Alcohol abuse Maternal Grandfather    Early death Paternal Grandfather        accidental   Prostate cancer Maternal Uncle    Heart attack  Maternal Uncle 60   Prostate cancer Maternal Uncle    Cancer Paternal Uncle        uncertain type   Alcohol abuse Paternal Uncle    Colon polyps Neg Hx    Rectal cancer Neg Hx    Stomach cancer Neg Hx    Esophageal cancer Neg Hx    Pancreatic cancer Neg Hx    Past Surgical History:  Procedure Laterality Date   COLONOSCOPY  03/19/2008; 2018   ext. hems    HERNIA REPAIR     x2 1968, inguinal [place] EXTRACTION     Social History   Occupational History   Not on file  Tobacco Use   Smoking status: Never   Smokeless tobacco: Never  Vaping Use   Vaping Use: Never used  Substance and Sexual Activity   Alcohol use: Yes    Comment: social   Drug use: No   Sexual activity: Not on file

## 2021-05-02 ENCOUNTER — Other Ambulatory Visit (INDEPENDENT_AMBULATORY_CARE_PROVIDER_SITE_OTHER): Payer: 59

## 2021-05-02 DIAGNOSIS — E349 Endocrine disorder, unspecified: Secondary | ICD-10-CM | POA: Diagnosis not present

## 2021-05-02 LAB — TESTOSTERONE: Testosterone: 410.48 ng/dL (ref 300.00–890.00)

## 2021-05-02 LAB — PSA: PSA: 1.73 ng/mL (ref 0.10–4.00)

## 2021-05-10 ENCOUNTER — Ambulatory Visit: Payer: Self-pay

## 2021-05-10 ENCOUNTER — Other Ambulatory Visit: Payer: Self-pay

## 2021-05-10 ENCOUNTER — Ambulatory Visit (INDEPENDENT_AMBULATORY_CARE_PROVIDER_SITE_OTHER): Payer: 59 | Admitting: Physical Medicine and Rehabilitation

## 2021-05-10 ENCOUNTER — Encounter: Payer: Self-pay | Admitting: Physical Medicine and Rehabilitation

## 2021-05-10 VITALS — BP 131/89 | HR 74

## 2021-05-10 DIAGNOSIS — G8929 Other chronic pain: Secondary | ICD-10-CM

## 2021-05-10 DIAGNOSIS — M47816 Spondylosis without myelopathy or radiculopathy, lumbar region: Secondary | ICD-10-CM | POA: Diagnosis not present

## 2021-05-10 MED ORDER — BUPIVACAINE HCL 0.5 % IJ SOLN
3.0000 mL | Freq: Once | INTRAMUSCULAR | Status: AC
  Administered 2021-05-10: 10:00:00 3 mL

## 2021-05-10 NOTE — Progress Notes (Signed)
Pt state lower back pain that ravels travels down his right leg and both feet. Pt state he can sleep at night due to hips pain. Pt state he has right shoulder pain and spam in the middle of his back. Pt state he takes pian meds to help ease his pain.  Numeric Pain Rating Scale and Functional Assessment Average Pain 2   In the last MONTH (on 0-10 scale) has pain interfered with the following?  1. General activity like being  able to carry out your everyday physical activities such as walking, climbing stairs, carrying groceries, or moving a chair?  Rating(7)   +Driver, -BT, -Dye Allergies.

## 2021-05-10 NOTE — Patient Instructions (Signed)

## 2021-05-19 ENCOUNTER — Ambulatory Visit: Payer: 59

## 2021-05-19 ENCOUNTER — Ambulatory Visit (INDEPENDENT_AMBULATORY_CARE_PROVIDER_SITE_OTHER): Payer: 59

## 2021-05-19 DIAGNOSIS — R7989 Other specified abnormal findings of blood chemistry: Secondary | ICD-10-CM | POA: Diagnosis not present

## 2021-05-19 MED ORDER — TESTOSTERONE CYPIONATE 200 MG/ML IM SOLN
200.0000 mg | Freq: Once | INTRAMUSCULAR | Status: AC
  Administered 2021-05-19: 15:00:00 200 mg via INTRAMUSCULAR

## 2021-05-19 NOTE — Progress Notes (Signed)
Walter Jordan. is a 55 y.o. male presents to the office today for Testosterone injections, per physician's orders. Original order: Testosterone injection 3 weeks. "Ok to keep interval between 2-3 weeks for injection but I would at least initially try to keep closer to 3 weeks (just not over that)" Testosterone 200mg  IM was administered right ventrogluteal today. Patient tolerated injection. Patient due for follow up labs/provider appt: No.  Patient next injection due: 06/02/21, appt made No. Pt declines to schedule appt for next injection at this time.   Sherre Poot: Please cosign since PCP is out of office today. Thank you!

## 2021-06-07 ENCOUNTER — Other Ambulatory Visit: Payer: Self-pay | Admitting: Physical Medicine and Rehabilitation

## 2021-06-07 ENCOUNTER — Telehealth: Payer: Self-pay | Admitting: Physical Medicine and Rehabilitation

## 2021-06-07 DIAGNOSIS — M47816 Spondylosis without myelopathy or radiculopathy, lumbar region: Secondary | ICD-10-CM

## 2021-06-07 DIAGNOSIS — G8929 Other chronic pain: Secondary | ICD-10-CM

## 2021-06-07 NOTE — Telephone Encounter (Signed)
Pt called and states that the last injection he had helped him over 80% and he is ready to move on to the next step.   CB [phone]

## 2021-06-07 NOTE — Telephone Encounter (Signed)
Thank you, I will put in an order for his second set of diagnostic facet joint injections.

## 2021-06-08 ENCOUNTER — Telehealth: Payer: Self-pay | Admitting: Physical Medicine and Rehabilitation

## 2021-06-08 ENCOUNTER — Other Ambulatory Visit: Payer: Self-pay | Admitting: Family Medicine

## 2021-06-08 DIAGNOSIS — G8929 Other chronic pain: Secondary | ICD-10-CM

## 2021-06-08 MED ORDER — HYDROCODONE-ACETAMINOPHEN 10-325 MG PO TABS: 1.0000 | ORAL_TABLET | Freq: Two times a day (BID) | ORAL | 0 refills | Status: DC | PRN

## 2021-06-08 NOTE — Telephone Encounter (Signed)
Pt called stating he would like to get an appt for a back inj.   [phone]

## 2021-06-08 NOTE — Telephone Encounter (Signed)
Last refill per controlled substance database: 04/13/21 Last OV: 12/25/20 Next OV: none scheduled.

## 2021-06-09 ENCOUNTER — Ambulatory Visit (INDEPENDENT_AMBULATORY_CARE_PROVIDER_SITE_OTHER): Payer: 59

## 2021-06-09 ENCOUNTER — Telehealth: Payer: Self-pay

## 2021-06-09 DIAGNOSIS — R7989 Other specified abnormal findings of blood chemistry: Secondary | ICD-10-CM

## 2021-06-09 MED ORDER — TESTOSTERONE CYPIONATE 200 MG/ML IM SOLN
200.0000 mg | Freq: Once | INTRAMUSCULAR | Status: AC
  Administered 2021-06-09: 200 mg via INTRAMUSCULAR

## 2021-06-09 NOTE — Telephone Encounter (Signed)
Pt is requesting to have supplies sent to pharmacy so that he can administer testosterone injections at home following prescribed schedule.

## 2021-06-09 NOTE — Progress Notes (Signed)
Walter Jordan. is a 56 y.o. male presents to the office today for Testosterone injections, per physician's orders. Original order: Testosterone injection 3 weeks. "Ok to keep interval between 2-3 weeks for injection but I would at least initially try to keep closer to 3 weeks (just not over that)" Testosterone 200mg  IM was administered right ventrogluteal today. Patient tolerated injection. Patient due for follow up labs/provider appt: No. Patient next injection due: 06/23/21, appt made No. Pt would like to start injections at home. Will discuss with PCP.  [name]   Dr Jerilee Hoh: please cosign since PCP is out of office.

## 2021-06-10 MED ORDER — "BD ECLIPSE SYRINGE/NEEDLE 22G X 1"" 3 ML MISC": 0 refills | Status: DC

## 2021-06-10 NOTE — Telephone Encounter (Signed)
Spoke with the patient and informed him the Rx was sent to [facility] as below.

## 2021-06-10 NOTE — Telephone Encounter (Signed)
ok 

## 2021-06-10 NOTE — Addendum Note (Signed)
Addended by: Agnes Lawrence on: 06/10/2021 01:46 PM   Modules accepted: Orders

## 2021-06-14 ENCOUNTER — Other Ambulatory Visit: Payer: Self-pay | Admitting: Family Medicine

## 2021-06-15 NOTE — Procedures (Signed)
Lumbar Diagnostic Facet Joint Nerve Block with Fluoroscopic Guidance   Patient: Walter Jordan.      Date of Birth: [birth year] MRN: [id number] PCP: Caren Macadam, MD      Visit Date: 05/10/2021   Universal Protocol:    Date/Time: 01/25/235:38 AM  Consent Given By: the patient  Position: PRONE  Additional Comments: Vital signs were monitored before and after the procedure. Patient was prepped and draped in the usual sterile fashion. The correct patient, procedure, and site was verified.   Injection Procedure Details:   Procedure diagnoses:  1. Chronic bilateral low back pain without sciatica   2. Facet hypertrophy of lumbar region      Meds Administered:  Meds ordered this encounter  Medications   bupivacaine (MARCAINE) 0.5 % (with pres) injection 3 mL     Laterality: Bilateral  Location/Site: L3-L4, L2 and L3 medial branches, L4-L5, L3 and L4 medial branches, and L5-S1, L4 medial branch and L5 dorsal ramus  Needle: 5.0 in., 25 ga.  Short bevel or Quincke spinal needle  Needle Placement: Oblique pedical  Findings:   -Comments: There was excellent flow of contrast along the articular pillars without intravascular flow.  Procedure Details: The fluoroscope beam is vertically oriented in AP and then obliqued 15 to 20 degrees to the ipsilateral side of the desired nerve to achieve the Scotty dog appearance.  The skin over the target area of the junction of the superior articulating process and the transverse process (sacral ala if blocking the L5 dorsal rami) was locally anesthetized with a 1 ml volume of 1% Lidocaine without Epinephrine.  The spinal needle was inserted and advanced in a trajectory view down to the target.   After contact with periosteum and negative aspirate for blood and CSF, correct placement without intravascular or epidural spread was confirmed by injecting 0.5 ml. of Isovue-250.  A spot radiograph was obtained of this image.    Next, a 0.5 ml.  volume of the injectate described above was injected. The needle was then redirected to the other facet joint nerves mentioned above if needed.  Prior to the procedure, the patient was given a Pain Diary which was completed for baseline measurements.  After the procedure, the patient rated their pain every 30 minutes and will continue rating at this frequency for a total of 5 hours.  The patient has been asked to complete the Diary and return to [place] by mail, fax or hand delivered as soon as possible.   Additional Comments:  The patient tolerated the procedure well Dressing: 2 x 2 sterile gauze and Band-Aid    Post-procedure details: Patient was observed during the procedure. Post-procedure instructions were reviewed.  Patient left the clinic in stable condition.

## 2021-06-15 NOTE — Progress Notes (Signed)
Walter Jordan. - 56 y.o. male MRN [id number]  Date of birth: 1965/09/08  Office Visit Note: Visit Date: 05/10/2021 PCP: Caren Macadam, MD Referred by: Caren Macadam, MD  Subjective: Chief Complaint  Patient presents with   Lower Back - Pain   Right Leg - Pain   Right Foot - Pain   Left Foot - Pain   HPI:  Walter Yepiz. is a 56 y.o. male who comes in today at the request of Barnet Pall, FNP for planned Bilateral  L3-4, L4-5, and L5-S1 Lumbar facet/medial branch block with fluoroscopic guidance.  The patient has failed conservative care including home exercise, medications, time and activity modification.  This injection will be diagnostic and hopefully therapeutic.  Please see requesting physician notes for further details and justification.  Exam has shown concordant pain with facet joint loading.   ROS Otherwise per HPI.  Assessment & Plan: Visit Diagnoses:    ICD-10-CM   1. Chronic bilateral low back pain without sciatica  M54.50 XR C-ARM NO REPORT   G89.29 Facet Injection    bupivacaine (MARCAINE) 0.5 % (with pres) injection 3 mL    2. Facet hypertrophy of lumbar region  M47.816 XR C-ARM NO REPORT    Facet Injection    bupivacaine (MARCAINE) 0.5 % (with pres) injection 3 mL      Plan: No additional findings.   Meds & Orders:  Meds ordered this encounter  Medications   bupivacaine (MARCAINE) 0.5 % (with pres) injection 3 mL    Orders Placed This Encounter  Procedures   Facet Injection   XR C-ARM NO REPORT    Follow-up: Return for Review Pain Diary.   Procedures: No procedures performed  Lumbar Diagnostic Facet Joint Nerve Block with Fluoroscopic Guidance   Patient: Walter Jordan.      Date of Birth: October 23, 1965 MRN: [id number] PCP: Caren Macadam, MD      Visit Date: 05/10/2021   Universal Protocol:    Date/Time: 01/25/235:38 AM  Consent Given By: the patient  Position: PRONE  Additional Comments: Vital signs were monitored  before and after the procedure. Patient was prepped and draped in the usual sterile fashion. The correct patient, procedure, and site was verified.   Injection Procedure Details:   Procedure diagnoses:  1. Chronic bilateral low back pain without sciatica   2. Facet hypertrophy of lumbar region      Meds Administered:  Meds ordered this encounter  Medications   bupivacaine (MARCAINE) 0.5 % (with pres) injection 3 mL     Laterality: Bilateral  Location/Site: L3-L4, L2 and L3 medial branches, L4-L5, L3 and L4 medial branches, and L5-S1, L4 medial branch and L5 dorsal ramus  Needle: 5.0 in., 25 ga.  Short bevel or Quincke spinal needle  Needle Placement: Oblique pedical  Findings:   -Comments: There was excellent flow of contrast along the articular pillars without intravascular flow.  Procedure Details: The fluoroscope beam is vertically oriented in AP and then obliqued 15 to 20 degrees to the ipsilateral side of the desired nerve to achieve the Scotty dog appearance.  The skin over the target area of the junction of the superior articulating process and the transverse process (sacral ala if blocking the L5 dorsal rami) was locally anesthetized with a 1 ml volume of 1% Lidocaine without Epinephrine.  The spinal needle was inserted and advanced in a trajectory view down to the target.   After contact with periosteum and negative  aspirate for blood and CSF, correct placement without intravascular or epidural spread was confirmed by injecting 0.5 ml. of Isovue-250.  A spot radiograph was obtained of this image.    Next, a 0.5 ml. volume of the injectate described above was injected. The needle was then redirected to the other facet joint nerves mentioned above if needed.  Prior to the procedure, the patient was given a Pain Diary which was completed for baseline measurements.  After the procedure, the patient rated their pain every 30 minutes and will continue rating at this  frequency for a total of 5 hours.  The patient has been asked to complete the Diary and return to [place] by mail, fax or hand delivered as soon as possible.   Additional Comments:  The patient tolerated the procedure well Dressing: 2 x 2 sterile gauze and Band-Aid    Post-procedure details: Patient was observed during the procedure. Post-procedure instructions were reviewed.  Patient left the clinic in stable condition.    Clinical History: MRI LUMBAR SPINE WITHOUT CONTRAST   TECHNIQUE: Multiplanar, multisequence MR imaging of the lumbar spine was performed. No intravenous contrast was administered.   COMPARISON:  Previous MRI from 07/28/2014.   FINDINGS: Segmentation: Standard. Lowest well-formed disc space labeled the L5-S1 level.   Alignment: 4 mm retrolisthesis of L5 on S1, with trace retrolisthesis of L2 on L3. Trace anterolisthesis of L3 on L4. Findings chronic and facet mediated. Straightening of the normal lumbar lordosis with underlying mild dextroscoliosis.   Vertebrae: Vertebral body height maintained without evidence acute or chronic fracture. Bone marrow signal intensity somewhat heterogeneous but within normal limits. No discrete or worrisome osseous lesions. Discogenic reactive endplate changes seen about the left aspect of the L4-5 interspace and right aspect of the L5-S1 interspace. No other abnormal marrow edema.   Conus medullaris and cauda equina: Conus extends to the L1 level. Conus and cauda equina appear normal.   Paraspinal and other soft tissues: Paraspinous soft tissues demonstrate no acute finding. T2 hyperintense simple cyst partially visualize within the interpolar left kidney. Visualized visceral structures otherwise unremarkable.   Disc levels:   L1-2: Negative interspace. Mild facet hypertrophy. No significant canal or foraminal stenosis.   L2-3: Chronic intervertebral disc space narrowing with diffuse disc bulge. Superimposed mild  reactive endplate changes. Moderate facet hypertrophy. Prominence of the dorsal epidural fat. Changes resultant moderate canal with bilateral lateral recess stenosis, greater on the left. Mild bilateral L2 foraminal narrowing.   L3-4: Chronic intervertebral disc space narrowing with mild diffuse disc bulge. There is a superimposed broad right extraforaminal disc protrusion, closely approximating the exiting right L3 nerve root (series 13, image 23). Moderate bilateral facet hypertrophy. Resultant mild spinal stenosis. Mild to moderate bilateral L3 foraminal narrowing, slightly worse on the right.   L4-5: Chronic intervertebral disc space narrowing with diffuse disc bulge and disc desiccation. Disc bulging asymmetric to the left with exuberant left-sided endplate osteophytic spurring. Superimposed moderate left worse than right facet hypertrophy. Trace joint effusion on the right. Resultant severe left foraminal stenosis with probable impingement of the left L4 nerve root. Moderate to severe left lateral recess stenosis. Central canal remains patent. Mild right foraminal narrowing noted.   L5-S1: Advanced chronic intervertebral disc space narrowing with diffuse disc bulge and disc desiccation. Extensive reactive endplate changes with marginal endplate osteophytic spurring. Mild bilateral facet hypertrophy with associated trace joint effusions. Broad-based posterior disc osteophyte contacts the descending S1 nerve roots bilaterally without frank neural impingement or displacement. Moderate to  severe bilateral L5 foraminal stenosis.   IMPRESSION: 1. Left eccentric disc osteophyte and facet hypertrophy at L4-5 with resultant severe left foraminal stenosis, with moderate to severe left lateral recess narrowing. Either the left L4 or descending L5 nerve roots could be affected. 2. Chronic degenerative disc osteophyte at L5-S1, contacting the descending S1 nerve roots bilaterally without  frank neural impingement or displacement. Moderate to severe bilateral L5 foraminal stenosis at this level. 3. Right extraforaminal disc protrusion at L3-4, potentially affecting the exiting right L3 nerve root. 4. Disc bulge with facet hypertrophy at L2-3 with resultant moderate spinal stenosis. 5. Overall, these changes are progressed relative to 2016.     Electronically Signed   By: Jeannine Boga M.D.   On: 08/17/2019 08:22     Objective:  VS:  HT:     WT:    BMI:      BP:131/89   HR:74bpm   TEMP: ( )   RESP:  Physical Exam Vitals and nursing note reviewed.  Constitutional:      General: He is not in acute distress.    Appearance: Normal appearance. He is not ill-appearing.  HENT:     Head: Normocephalic and atraumatic.     Right Ear: External ear normal.     Left Ear: External ear normal.     Nose: No congestion.  Eyes:     Extraocular Movements: Extraocular movements intact.  Cardiovascular:     Rate and Rhythm: Normal rate.     Pulses: Normal pulses.  Pulmonary:     Effort: Pulmonary effort is normal. No respiratory distress.  Abdominal:     General: There is no distension.     Palpations: Abdomen is soft.  Musculoskeletal:        General: No tenderness or signs of injury.     Cervical back: Neck supple.     Right lower leg: No edema.     Left lower leg: No edema.     Comments: Patient has good distal strength without clonus. Patient somewhat slow to rise from a seated position to full extension.  There is concordant low back pain with facet loading and lumbar spine extension rotation.  There are no definitive trigger points but the patient is somewhat tender across the lower back and PSIS.  There is no pain with hip rotation.   Skin:    Findings: No erythema or rash.  Neurological:     General: No focal deficit present.     Mental Status: He is alert and oriented to person, place, and time.     Sensory: No sensory deficit.     Motor: No weakness or  abnormal muscle tone.     Coordination: Coordination normal.  Psychiatric:        Mood and Affect: Mood normal.        Behavior: Behavior normal.     Imaging: No results found.

## 2021-06-16 ENCOUNTER — Telehealth: Payer: Self-pay | Admitting: Physical Medicine and Rehabilitation

## 2021-06-16 NOTE — Telephone Encounter (Signed)
Pt called about his referral. Pt stated its been a few months and was waiting for approval for appt. Please call pt about this matter at [phone].

## 2021-06-30 ENCOUNTER — Ambulatory Visit (INDEPENDENT_AMBULATORY_CARE_PROVIDER_SITE_OTHER): Payer: 59

## 2021-06-30 ENCOUNTER — Encounter: Payer: Self-pay | Admitting: Adult Health

## 2021-06-30 ENCOUNTER — Other Ambulatory Visit: Payer: Self-pay

## 2021-06-30 ENCOUNTER — Ambulatory Visit: Payer: 59 | Admitting: Adult Health

## 2021-06-30 VITALS — BP 120/88 | HR 76 | Temp 98.8°F | Wt 221.0 lb

## 2021-06-30 DIAGNOSIS — R7989 Other specified abnormal findings of blood chemistry: Secondary | ICD-10-CM | POA: Diagnosis not present

## 2021-06-30 DIAGNOSIS — M546 Pain in thoracic spine: Secondary | ICD-10-CM

## 2021-06-30 MED ORDER — TESTOSTERONE CYPIONATE 100 MG/ML IM SOLN: 200.0000 mg | INTRAMUSCULAR | Status: DC

## 2021-06-30 MED ORDER — TESTOSTERONE CYPIONATE 200 MG/ML IM SOLN
200.0000 mg | Freq: Once | INTRAMUSCULAR | Status: AC
  Administered 2021-06-30: 200 mg via INTRAMUSCULAR

## 2021-06-30 MED ORDER — METHYLPREDNISOLONE 4 MG PO TBPK: ORAL_TABLET | ORAL | 0 refills | Status: DC

## 2021-06-30 NOTE — Patient Instructions (Signed)
It was great meeting you today   I think you strained your paraspinal muscles and latissimus dorsi muscle group   I have sent in a medrol dose pack and will order an xray to make sure nothing acutely is wrong with your spine

## 2021-06-30 NOTE — Addendum Note (Signed)
Addended by: Gwenyth Ober R on: 06/30/2021 04:45 PM   Modules accepted: Orders

## 2021-06-30 NOTE — Progress Notes (Signed)
Subjective:    Patient ID: Walter Halsted., male    DOB: 02/04/1966, 56 y.o.   MRN: [id number]  HPI 56 year old male who  has a past medical history of Allergic rhinitis, Allergy, Arthritis, ED (erectile dysfunction), Elevated lipase, Enlarged prostate, GERD (gastroesophageal reflux disease), Hepatic steatosis, Hypothyroidism, Low back pain, Low testosterone, and Sleep apnea.  He is a patient of Dr. Ethlyn Gallery who I am seeing today for back pain. He reports that he has a history of chronic back pain. About 4-5 days ago he was going to sit down on a chair and the chair rolled out causing him to fall onto the floor. He has had right sided thoracic pain since that time. Pain is worse with laying down and reaching motions. Pain radiates to his right flank.  His usual cocktail of 1 tab of hydrocodone, flexeril and motrin has not really helped with his pain.   Review of Systems See HPI   Past Medical History:  Diagnosis Date   Allergic rhinitis    Allergy    Arthritis    back   ED (erectile dysfunction)    Elevated lipase    Enlarged prostate    GERD (gastroesophageal reflux disease)    Hepatic steatosis    Hypothyroidism    Low back pain    Low testosterone    Sleep apnea    had sleep study 09-10-16- needs new cpap    Social History   Socioeconomic History   Marital status: Married    Spouse name: Not on file   Number of children: Not on file   Years of education: Not on file   Highest education level: Some college, no degree  Occupational History   Not on file  Tobacco Use   Smoking status: Never   Smokeless tobacco: Never  Vaping Use   Vaping Use: Never used  Substance and Sexual Activity   Alcohol use: Yes    Comment: social   Drug use: No   Sexual activity: Not on file  Other Topics Concern   Not on file  Social History Narrative   Not on file   Social Determinants of Health   Financial Resource Strain: Low Risk    Difficulty of Paying Living Expenses: Not  hard at all  Food Insecurity: No Food Insecurity   Worried About Charity fundraiser in the Last Year: Never true   Ran Out of Food in the Last Year: Never true  Transportation Needs: No Transportation Needs   Lack of Transportation (Medical): No   Lack of Transportation (Non-Medical): No  Physical Activity: Insufficiently Active   Days of Exercise per Week: 1 day   Minutes of Exercise per Session: 90 min  Stress: Stress Concern Present   Feeling of Stress : To some extent  Social Connections: Unknown   Frequency of Communication with Friends and Family: Patient refused   Frequency of Social Gatherings with Friends and Family: Patient refused   Attends Religious Services: More than 4 times per year   Active Member of [employer] or Organizations: Yes   Attends Archivist Meetings: Never   Marital Status: Married  Human resources officer Violence: Not on file    Past Surgical History:  Procedure Laterality Date   COLONOSCOPY  03/19/2008; 2018   ext. hems    HERNIA REPAIR     x2 1968, inguinal 1973   WISDOM TOOTH EXTRACTION      Family History  Problem Relation Age  of Onset   Cancer Father        sarcoma- both kidneys removed   Diabetes Father        s/p renal transplant   Arthritis Mother    High blood pressure Mother    High Cholesterol Mother    Heart disease Mother    Diabetes Brother    Diabetes Maternal Grandmother    Diabetes Paternal Grandmother    Cancer Paternal Grandmother        colon   Depression Other    Diabetes Other    Hypertension Other    Kidney disease Other    Obesity Sister    Stroke Maternal Grandfather 26       tobacco abuse   Alcohol abuse Maternal Grandfather    Early death Paternal Grandfather        accidental   Prostate cancer Maternal Uncle    Heart attack Maternal Uncle 60   Prostate cancer Maternal Uncle    Cancer Paternal Uncle        uncertain type   Alcohol abuse Paternal Uncle    Colon polyps Neg Hx    Rectal cancer Neg Hx     Stomach cancer Neg Hx    Esophageal cancer Neg Hx    Pancreatic cancer Neg Hx     No Known Allergies  Current Outpatient Medications on File Prior to Visit  Medication Sig Dispense Refill   alfuzosin (UROXATRAL) 10 MG 24 hr tablet Take 10 mg by mouth daily.     cyclobenzaprine (FLEXERIL) 10 MG tablet TAKE 1 TABLET BY MOUTH THREE TIMES A DAY AS NEEDED FOR MUSCLE SPASMS 90 tablet 3   gabapentin (NEURONTIN) 100 MG capsule Take 2 capsules (200 mg total) by mouth at bedtime. 180 capsule 3   HYDROcodone-acetaminophen (NORCO) 10-325 MG tablet Take 1 tablet by mouth 2 (two) times daily as needed. 60 tablet 0   levothyroxine (SYNTHROID) 50 MCG tablet TAKE 1 TABLET BY MOUTH EVERY DAY BEFORE BREAKFAST 90 tablet 1   losartan (COZAAR) 25 MG tablet TAKE 1 TABLET BY MOUTH EVERY DAY 90 tablet 1   Multiple Vitamins-Minerals (MEGA MULTIVITAMIN FOR MEN PO) Take by mouth daily.     pantoprazole (PROTONIX) 40 MG tablet TAKE 1 TABLET BY MOUTH EVERY DAY 90 tablet 1   SYRINGE-NEEDLE, DISP, 3 ML (BD ECLIPSE SYRINGE/NEEDLE) 22G X 1" 3 ML MISC Use as directed for testosterone injection 10 each 0   testosterone cypionate (DEPOTESTOSTERONE CYPIONATE) 200 MG/ML injection Inject 1 mL (200 mg total) into the muscle every 21 ( twenty-one) days. 6 mL 1   Current Facility-Administered Medications on File Prior to Visit  Medication Dose Route Frequency Provider Last Rate Last Admin   0.9 %  sodium chloride infusion  500 mL Intravenous Continuous Danis, Estill Cotta III, MD        BP 120/88    Pulse 76    Temp 98.8 F (37.1 C)    Wt 221 lb (100.2 kg)    SpO2 99%    BMI 31.71 kg/m       Objective:   Physical Exam Vitals and nursing note reviewed.  Constitutional:      Appearance: Normal appearance.  Musculoskeletal:        General: No swelling, tenderness (no tenderness with palpation today) or deformity. Normal range of motion.  Skin:    General: Skin is warm and dry.  Neurological:     General: No focal deficit  present.  Mental Status: He is alert and oriented to person, place, and time.  Psychiatric:        Mood and Affect: Mood normal.        Behavior: Behavior normal.        Thought Content: Thought content normal.        Judgment: Judgment normal.      Assessment & Plan:  1. Acute left-sided thoracic back pain - likely msk strain.  - Continue with home medications for chronic back pain. Will prescribe medrol dose pack and ge xray to r/o acute spinal injury  - methylPREDNISolone (MEDROL DOSEPAK) 4 MG TBPK tablet; Take as directed  Dispense: 21 tablet; Refill: 0 - DG Thoracic Spine W/Swimmers; Future   Dorothyann Peng, NP   Time spent on chart review, time with patient; discussion of back pain and muscle strain, treatment, follow up plan, and documentation 30 minutes

## 2021-07-01 ENCOUNTER — Other Ambulatory Visit: Payer: Self-pay | Admitting: Family Medicine

## 2021-07-21 ENCOUNTER — Telehealth: Payer: Self-pay

## 2021-07-21 NOTE — Telephone Encounter (Signed)
---  caller states he feels like his heart is flutter every now and then. couple weeks ago had a lot of congestion for 5-6 days. was short of breath going up stairs. ? ?07/20/2021 1:20:03 PM Go to ED Now (or PCP triage) [employer], RN, Estill Bamberg ? ?Referrals ?[facility] - ED ? ?07/21/21 1549: LVM instructions for pt to call office to schedule appt with PCP. ?

## 2021-07-27 ENCOUNTER — Ambulatory Visit (INDEPENDENT_AMBULATORY_CARE_PROVIDER_SITE_OTHER): Payer: 59 | Admitting: Physical Medicine and Rehabilitation

## 2021-07-27 ENCOUNTER — Ambulatory Visit: Payer: Self-pay

## 2021-07-27 ENCOUNTER — Other Ambulatory Visit: Payer: Self-pay

## 2021-07-27 ENCOUNTER — Other Ambulatory Visit: Payer: Self-pay | Admitting: Family Medicine

## 2021-07-27 ENCOUNTER — Encounter: Payer: Self-pay | Admitting: Physical Medicine and Rehabilitation

## 2021-07-27 VITALS — BP 120/80 | HR 80

## 2021-07-27 DIAGNOSIS — G8929 Other chronic pain: Secondary | ICD-10-CM

## 2021-07-27 DIAGNOSIS — M25569 Pain in unspecified knee: Secondary | ICD-10-CM

## 2021-07-27 DIAGNOSIS — M47816 Spondylosis without myelopathy or radiculopathy, lumbar region: Secondary | ICD-10-CM | POA: Diagnosis not present

## 2021-07-27 MED ORDER — HYDROCODONE-ACETAMINOPHEN 10-325 MG PO TABS: 1.0000 | ORAL_TABLET | Freq: Two times a day (BID) | ORAL | 0 refills | Status: DC | PRN

## 2021-07-27 MED ORDER — METHYLPREDNISOLONE ACETATE 80 MG/ML IJ SUSP: 80.0000 mg | Freq: Once | INTRAMUSCULAR | Status: DC

## 2021-07-27 NOTE — Patient Instructions (Signed)

## 2021-07-27 NOTE — Progress Notes (Signed)
Pt state lower back pain.. Pt state walking, standing and laying down. Pt state he takes pian meds to help ease his pain. Pt has hx of inj on 05/10/21 pt state it helped. ? ?Numeric Pain Rating Scale and Functional Assessment ?Average Pain 2 ? ? ?In the last MONTH (on 0-10 scale) has pain interfered with the following? ? ?1. General activity like being  able to carry out your everyday physical activities such as walking, climbing stairs, carrying groceries, or moving a chair?  ?Rating(8) ? ? ?+Driver, -BT, -Dye Allergies. ? ?

## 2021-08-03 ENCOUNTER — Other Ambulatory Visit: Payer: Self-pay | Admitting: Adult Health

## 2021-08-03 ENCOUNTER — Telehealth: Payer: Self-pay

## 2021-08-03 ENCOUNTER — Telehealth: Payer: Self-pay | Admitting: Physical Medicine and Rehabilitation

## 2021-08-03 ENCOUNTER — Other Ambulatory Visit: Payer: Self-pay | Admitting: Physical Medicine and Rehabilitation

## 2021-08-03 DIAGNOSIS — M545 Low back pain, unspecified: Secondary | ICD-10-CM

## 2021-08-03 DIAGNOSIS — M47816 Spondylosis without myelopathy or radiculopathy, lumbar region: Secondary | ICD-10-CM

## 2021-08-03 DIAGNOSIS — M546 Pain in thoracic spine: Secondary | ICD-10-CM

## 2021-08-03 NOTE — Telephone Encounter (Signed)
Patient called into the office and would like to follow up with Dr. Ernestina Patches he stated that he is having some issues since his last injection  ?

## 2021-08-03 NOTE — Telephone Encounter (Signed)
Spoke with patient via telephone this afternoon, reports greater than 90% pain relief with bilateral L3-L4, L4-L5 and L5-S1 facet joint/medial branch blocks on 07/27/2021. Order placed for radiofrequency ablation. ?

## 2021-08-04 ENCOUNTER — Ambulatory Visit (INDEPENDENT_AMBULATORY_CARE_PROVIDER_SITE_OTHER): Payer: 59

## 2021-08-04 DIAGNOSIS — R7989 Other specified abnormal findings of blood chemistry: Secondary | ICD-10-CM | POA: Diagnosis not present

## 2021-08-04 MED ORDER — TESTOSTERONE CYPIONATE 200 MG/ML IM SOLN
200.0000 mg | Freq: Once | INTRAMUSCULAR | Status: AC
  Administered 2021-08-04: 200 mg via INTRAMUSCULAR

## 2021-08-04 NOTE — Progress Notes (Signed)
Walter Jordan. is a 56 y.o. male presents to the office today for Testosteron injections, per Dr. Ethlyn Gallery. ?Original order: Testosterone injection 3 weeks. "Ok to keep interval between 2-3 weeks for injection but I would at least initially try to keep closer to 3 weeks (just not over that)" ?Testosterone '200mg'$  IM was administered right ventrogluteal today. Patient tolerated injection. ?Patient due for follow up labs/provider appt: No. ?Patient next injection due: 08/18/21, appt made No. Pt declines to schedule appt at this time. ? ?[name]  ? ?Dr Volanda Napoleon: please cosign since PCP is out of office today. ?

## 2021-08-05 ENCOUNTER — Telehealth: Payer: Self-pay | Admitting: Physical Medicine and Rehabilitation

## 2021-08-05 NOTE — Telephone Encounter (Signed)
Pt is returning call to schedule ?

## 2021-08-06 NOTE — Progress Notes (Signed)
? ?Walter Jordan. - 56 y.o. male MRN [id number]  Date of birth: 19-Apr-1966 ? ?Office Visit Note: ?Visit Date: 07/27/2021 ?PCP: Caren Macadam, MD ?Referred by: Caren Macadam, MD ? ?Subjective: ?Chief Complaint  ?Patient presents with  ? Lower Back - Pain  ? ?HPI:  Walter Jordan. is a 56 y.o. male who comes in today for planned repeat Bilateral L3-4, L4-5, and L5-S1 Lumbar facet/medial branch block with fluoroscopic guidance.  The patient has failed conservative care including home exercise, medications, time and activity modification.  This injection will be diagnostic and hopefully therapeutic.  Please see requesting physician notes for further details and justification.  Exam shows concordant low back pain with facet joint loading and extension. Patient received more than 80% pain relief from prior injection. This would be the second block in a diagnostic double block paradigm. ? ? ? ? ?Referring:Dr. Eduard Roux and Barnet Pall, FNP  ? ?ROS Otherwise per HPI. ? ?Assessment & Plan: ?Visit Diagnoses:  ?  ICD-10-CM   ?1. Facet hypertrophy of lumbar region  M47.816 XR C-ARM NO REPORT  ?  Facet Injection  ?  DISCONTINUED: methylPREDNISolone acetate (DEPO-MEDROL) injection 80 mg  ?  ?  ?Plan: No additional findings.  ? ?Meds & Orders:  ?Meds ordered this encounter  ?Medications  ? DISCONTD: methylPREDNISolone acetate (DEPO-MEDROL) injection 80 mg  ?  ?Orders Placed This Encounter  ?Procedures  ? Facet Injection  ? XR C-ARM NO REPORT  ?  ?Follow-up: Return for Review Pain Diary.  ? ?Procedures: ?No procedures performed  ?Lumbar Diagnostic Facet Joint Nerve Block with Fluoroscopic Guidance  ? ?Patient: Walter Jordan.      ?Date of Birth: 12-19-1965 ?MRN: [id number] ?PCP: Caren Macadam, MD      ?Visit Date: 07/27/2021 ?  ?Universal Protocol:    ?Date/Time: 03/18/237:08 PM ? ?Consent Given By: the patient ? ?Position: PRONE ? ?Additional Comments: ?Vital signs were monitored before and after the  procedure. ?Patient was prepped and draped in the usual sterile fashion. ?The correct patient, procedure, and site was verified. ? ? ?Injection Procedure Details:  ? ?Procedure diagnoses:  ?1. Facet hypertrophy of lumbar region   ?  ? ?Meds Administered:  ?Meds ordered this encounter  ?Medications  ? methylPREDNISolone acetate (DEPO-MEDROL) injection 80 mg  ?  ? ?Laterality: Bilateral ? ?Location/Site: L3-L4, L2 and L3 medial branches, L4-L5, L3 and L4 medial branches, and L5-S1, L4 medial branch and L5 dorsal ramus ? ?Needle: 5.0 in., 25 ga.  Short bevel or Quincke spinal needle ? ?Needle Placement: Oblique pedical ? ?Findings: ? ? -Comments: There was excellent flow of contrast along the articular pillars without intravascular flow. ? ?Procedure Details: ?The fluoroscope beam is vertically oriented in AP and then obliqued 15 to 20 degrees to the ipsilateral side of the desired nerve to achieve the ?Scotty dog? appearance.  The skin over the target area of the junction of the superior articulating process and the transverse process (sacral ala if blocking the L5 dorsal rami) was locally anesthetized with a 1 ml volume of 1% Lidocaine without Epinephrine.  The spinal needle was inserted and advanced in a trajectory view down to the target.  ? ?After contact with periosteum and negative aspirate for blood and CSF, correct placement without intravascular or epidural spread was confirmed by injecting 0.5 ml. of Isovue-250.  A spot radiograph was obtained of this image.   ? ?Next, a 0.5 ml. volume of the injectate  described above was injected. The needle was then redirected to the other facet joint nerves mentioned above if needed. ? ?Prior to the procedure, the patient was given a Pain Diary which was completed for baseline measurements.  After the procedure, the patient rated their pain every 30 minutes and will continue rating at this frequency for a total of 5 hours.  The patient has been asked to complete the Diary  and return to [place] by mail, fax or hand delivered as soon as possible. ? ? ?Additional Comments:  ?The patient tolerated the procedure well ?Dressing: 2 x 2 sterile gauze and Band-Aid ?  ? ?Post-procedure details: ?Patient was observed during the procedure. ?Post-procedure instructions were reviewed. ? ?Patient left the clinic in stable condition.   ? ?Clinical History: ?MRI LUMBAR SPINE WITHOUT CONTRAST  ?   ?TECHNIQUE:  ?Multiplanar, multisequence MR imaging of the lumbar spine was  ?performed. No intravenous contrast was administered.  ?   ?COMPARISON:  Previous MRI from 07/28/2014.  ?   ?FINDINGS:  ?Segmentation: Standard. Lowest well-formed disc space labeled the  ?L5-S1 level.  ?   ?Alignment: 4 mm retrolisthesis of L5 on S1, with trace  ?retrolisthesis of L2 on L3. Trace anterolisthesis of L3 on L4.  ?Findings chronic and facet mediated. Straightening of the normal  ?lumbar lordosis with underlying mild dextroscoliosis.  ?   ?Vertebrae: Vertebral body height maintained without evidence acute  ?or chronic fracture. Bone marrow signal intensity somewhat  ?heterogeneous but within normal limits. No discrete or worrisome  ?osseous lesions. Discogenic reactive endplate changes seen about the  ?left aspect of the L4-5 interspace and right aspect of the L5-S1  ?interspace. No other abnormal marrow edema.  ?   ?Conus medullaris and cauda equina: Conus extends to the L1 level.  ?Conus and cauda equina appear normal.  ?   ?Paraspinal and other soft tissues: Paraspinous soft tissues  ?demonstrate no acute finding. T2 hyperintense simple cyst partially  ?visualize within the interpolar left kidney. Visualized visceral  ?structures otherwise unremarkable.  ?   ?Disc levels:  ?   ?L1-2: Negative interspace. Mild facet hypertrophy. No significant  ?canal or foraminal stenosis.  ?   ?L2-3: Chronic intervertebral disc space narrowing with diffuse disc  ?bulge. Superimposed mild reactive endplate changes. Moderate facet   ?hypertrophy. Prominence of the dorsal epidural fat. Changes  ?resultant moderate canal with bilateral lateral recess stenosis,  ?greater on the left. Mild bilateral L2 foraminal narrowing.  ?   ?L3-4: Chronic intervertebral disc space narrowing with mild diffuse  ?disc bulge. There is a superimposed broad right extraforaminal disc  ?protrusion, closely approximating the exiting right L3 nerve root  ?(series 13, image 23). Moderate bilateral facet hypertrophy.  ?Resultant mild spinal stenosis. Mild to moderate bilateral L3  ?foraminal narrowing, slightly worse on the right.  ?   ?L4-5: Chronic intervertebral disc space narrowing with diffuse disc  ?bulge and disc desiccation. Disc bulging asymmetric to the left with  ?exuberant left-sided endplate osteophytic spurring. Superimposed  ?moderate left worse than right facet hypertrophy. Trace joint  ?effusion on the right. Resultant severe left foraminal stenosis with  ?probable impingement of the left L4 nerve root. Moderate to severe  ?left lateral recess stenosis. Central canal remains patent. Mild  ?right foraminal narrowing noted.  ?   ?L5-S1: Advanced chronic intervertebral disc space narrowing with  ?diffuse disc bulge and disc desiccation. Extensive reactive endplate  ?changes with marginal endplate osteophytic spurring. Mild bilateral  ?facet hypertrophy with associated  trace joint effusions. Broad-based  ?posterior disc osteophyte contacts the descending S1 nerve roots  ?bilaterally without frank neural impingement or displacement.  ?Moderate to severe bilateral L5 foraminal stenosis.  ?   ?IMPRESSION:  ?1. Left eccentric disc osteophyte and facet hypertrophy at L4-5 with  ?resultant severe left foraminal stenosis, with moderate to severe  ?left lateral recess narrowing. Either the left L4 or descending L5  ?nerve roots could be affected.  ?2. Chronic degenerative disc osteophyte at L5-S1, contacting the  ?descending S1 nerve roots bilaterally without frank  neural  ?impingement or displacement. Moderate to severe bilateral L5  ?foraminal stenosis at this level.  ?3. Right extraforaminal disc protrusion at L3-4, potentially  ?affecting the exiting right L3 nerve root.  ?

## 2021-08-06 NOTE — Procedures (Signed)
Lumbar Diagnostic Facet Joint Nerve Block with Fluoroscopic Guidance  ? ?Patient: Walter Jordan.      ?Date of Birth: [birth year] ?MRN: [id number] ?PCP: Caren Macadam, MD      ?Visit Date: 07/27/2021 ?  ?Universal Protocol:    ?Date/Time: 03/18/237:08 PM ? ?Consent Given By: the patient ? ?Position: PRONE ? ?Additional Comments: ?Vital signs were monitored before and after the procedure. ?Patient was prepped and draped in the usual sterile fashion. ?The correct patient, procedure, and site was verified. ? ? ?Injection Procedure Details:  ? ?Procedure diagnoses:  ?1. Facet hypertrophy of lumbar region   ?  ? ?Meds Administered:  ?Meds ordered this encounter  ?Medications  ? methylPREDNISolone acetate (DEPO-MEDROL) injection 80 mg  ?  ? ?Laterality: Bilateral ? ?Location/Site: L3-L4, L2 and L3 medial branches, L4-L5, L3 and L4 medial branches, and L5-S1, L4 medial branch and L5 dorsal ramus ? ?Needle: 5.0 in., 25 ga.  Short bevel or Quincke spinal needle ? ?Needle Placement: Oblique pedical ? ?Findings: ? ? -Comments: There was excellent flow of contrast along the articular pillars without intravascular flow. ? ?Procedure Details: ?The fluoroscope beam is vertically oriented in AP and then obliqued 15 to 20 degrees to the ipsilateral side of the desired nerve to achieve the ?Scotty dog? appearance.  The skin over the target area of the junction of the superior articulating process and the transverse process (sacral ala if blocking the L5 dorsal rami) was locally anesthetized with a 1 ml volume of 1% Lidocaine without Epinephrine.  The spinal needle was inserted and advanced in a trajectory view down to the target.  ? ?After contact with periosteum and negative aspirate for blood and CSF, correct placement without intravascular or epidural spread was confirmed by injecting 0.5 ml. of Isovue-250.  A spot radiograph was obtained of this image.   ? ?Next, a 0.5 ml. volume of the injectate described above was injected.  The needle was then redirected to the other facet joint nerves mentioned above if needed. ? ?Prior to the procedure, the patient was given a Pain Diary which was completed for baseline measurements.  After the procedure, the patient rated their pain every 30 minutes and will continue rating at this frequency for a total of 5 hours.  The patient has been asked to complete the Diary and return to [place] by mail, fax or hand delivered as soon as possible. ? ? ?Additional Comments:  ?The patient tolerated the procedure well ?Dressing: 2 x 2 sterile gauze and Band-Aid ?  ? ?Post-procedure details: ?Patient was observed during the procedure. ?Post-procedure instructions were reviewed. ? ?Patient left the clinic in stable condition.  ?

## 2021-08-08 ENCOUNTER — Telehealth: Payer: Self-pay | Admitting: Physical Medicine and Rehabilitation

## 2021-08-08 NOTE — Telephone Encounter (Signed)
error 

## 2021-08-08 NOTE — Telephone Encounter (Signed)
Pt called and would like to get another injection.  ? ?CB [phone]  ?

## 2021-08-19 ENCOUNTER — Ambulatory Visit: Payer: 59 | Admitting: Orthopaedic Surgery

## 2021-08-19 DIAGNOSIS — G8929 Other chronic pain: Secondary | ICD-10-CM

## 2021-08-19 DIAGNOSIS — M545 Low back pain, unspecified: Secondary | ICD-10-CM | POA: Diagnosis not present

## 2021-08-19 NOTE — Progress Notes (Signed)
? ?Office Visit Note ?  ?Patient: Walter Jordan.           ?Date of Birth: Mar 05, 1966           ?MRN: [id number] ?Visit Date: 08/19/2021 ?             ?Requested by: Caren Macadam, MD ?[address] ?[place],  [place] [id number] ?PCP: Caren Macadam, MD ? ? ?Assessment & Plan: ?Visit Diagnoses:  ?1. Chronic right-sided low back pain without sciatica   ? ? ?Plan: Walter Jordan comes back today for evaluation of constant left hip and back numbness.  Denies any pain.  He sees Dr. Ernestina Jordan regularly for his back issues.  He wants to make sure it is not a circulation or hip problem. ? ?Examination of left lower extremity shows painless range of motion of the left hip.  Negative sciatic tension signs.  Capillary refill normal.  No neurovascular compromise. ? ?Based on findings of constant numbness from the left iliac crest down to the lateral knee impression is lumbar radiculopathy.  Do not see any evidence that is coming from his hip joint or circulation problem.  He is already taking gabapentin has not noticed much improvement.  He will mention this to Dr. Ernestina Jordan ?He sees them to see if he has any ideas on how to improve this. ? ?Follow-Up Instructions: No follow-ups on file.  ? ?Orders:  ?No orders of the defined types were placed in this encounter. ? ?No orders of the defined types were placed in this encounter. ? ? ? ? Procedures: ?No procedures performed ? ? ?Clinical Data: ?No additional findings. ? ? ?Subjective: ?Chief Complaint  ?Patient presents with  ? Left Hip - Pain  ? ? ?HPI ? ?Review of Systems ? ? ?Objective: ?Vital Signs: There were no vitals taken for this visit. ? ?Physical Exam ? ?Ortho Exam ? ?Specialty Comments:  ?MRI LUMBAR SPINE WITHOUT CONTRAST  ?   ?TECHNIQUE:  ?Multiplanar, multisequence MR imaging of the lumbar spine was  ?performed. No intravenous contrast was administered.  ?   ?COMPARISON:  Previous MRI from 07/28/2014.  ?   ?FINDINGS:  ?Segmentation: Standard. Lowest well-formed disc  space labeled the  ?L5-S1 level.  ?   ?Alignment: 4 mm retrolisthesis of L5 on S1, with trace  ?retrolisthesis of L2 on L3. Trace anterolisthesis of L3 on L4.  ?Findings chronic and facet mediated. Straightening of the normal  ?lumbar lordosis with underlying mild dextroscoliosis.  ?   ?Vertebrae: Vertebral body height maintained without evidence acute  ?or chronic fracture. Bone marrow signal intensity somewhat  ?heterogeneous but within normal limits. No discrete or worrisome  ?osseous lesions. Discogenic reactive endplate changes seen about the  ?left aspect of the L4-5 interspace and right aspect of the L5-S1  ?interspace. No other abnormal marrow edema.  ?   ?Conus medullaris and cauda equina: Conus extends to the L1 level.  ?Conus and cauda equina appear normal.  ?   ?Paraspinal and other soft tissues: Paraspinous soft tissues  ?demonstrate no acute finding. T2 hyperintense simple cyst partially  ?visualize within the interpolar left kidney. Visualized visceral  ?structures otherwise unremarkable.  ?   ?Disc levels:  ?   ?L1-2: Negative interspace. Mild facet hypertrophy. No significant  ?canal or foraminal stenosis.  ?   ?L2-3: Chronic intervertebral disc space narrowing with diffuse disc  ?bulge. Superimposed mild reactive endplate changes. Moderate facet  ?hypertrophy. Prominence of the dorsal epidural fat. Changes  ?resultant  moderate canal with bilateral lateral recess stenosis,  ?greater on the left. Mild bilateral L2 foraminal narrowing.  ?   ?L3-4: Chronic intervertebral disc space narrowing with mild diffuse  ?disc bulge. There is a superimposed broad right extraforaminal disc  ?protrusion, closely approximating the exiting right L3 nerve root  ?(series 13, image 23). Moderate bilateral facet hypertrophy.  ?Resultant mild spinal stenosis. Mild to moderate bilateral L3  ?foraminal narrowing, slightly worse on the right.  ?   ?L4-5: Chronic intervertebral disc space narrowing with diffuse disc  ?bulge  and disc desiccation. Disc bulging asymmetric to the left with  ?exuberant left-sided endplate osteophytic spurring. Superimposed  ?moderate left worse than right facet hypertrophy. Trace joint  ?effusion on the right. Resultant severe left foraminal stenosis with  ?probable impingement of the left L4 nerve root. Moderate to severe  ?left lateral recess stenosis. Central canal remains patent. Mild  ?right foraminal narrowing noted.  ?   ?L5-S1: Advanced chronic intervertebral disc space narrowing with  ?diffuse disc bulge and disc desiccation. Extensive reactive endplate  ?changes with marginal endplate osteophytic spurring. Mild bilateral  ?facet hypertrophy with associated trace joint effusions. Broad-based  ?posterior disc osteophyte contacts the descending S1 nerve roots  ?bilaterally without frank neural impingement or displacement.  ?Moderate to severe bilateral L5 foraminal stenosis.  ?   ?IMPRESSION:  ?1. Left eccentric disc osteophyte and facet hypertrophy at L4-5 with  ?resultant severe left foraminal stenosis, with moderate to severe  ?left lateral recess narrowing. Either the left L4 or descending L5  ?nerve roots could be affected.  ?2. Chronic degenerative disc osteophyte at L5-S1, contacting the  ?descending S1 nerve roots bilaterally without frank neural  ?impingement or displacement. Moderate to severe bilateral L5  ?foraminal stenosis at this level.  ?3. Right extraforaminal disc protrusion at L3-4, potentially  ?affecting the exiting right L3 nerve root.  ?4. Disc bulge with facet hypertrophy at L2-3 with resultant moderate  ?spinal stenosis.  ?5. Overall, these changes are progressed relative to 2016.  ?   ?   ?Electronically Signed  ?  By: Walter Jordan M.D.  ?  On: 08/17/2019 08:22 ? ?Imaging: ?No results found. ? ? ?PMFS History: ?Patient Active Problem List  ? Diagnosis Date Noted  ? Nonallopathic lesion of lumbar region 07/28/2020  ? Nonallopathic lesion of sacral region 07/28/2020  ?  Nonallopathic lesion of thoracic region 07/28/2020  ? Impaired glucose tolerance 12/04/2019  ? Low testosterone in male 06/11/2019  ? Controlled type 2 diabetes mellitus without complication, without long-term current use of insulin ([place]) 06/11/2019  ? Pain in left hand 03/28/2019  ? Hx of adenomatous colonic polyps 10/25/2017  ? Lumbar radiculopathy 05/11/2016  ? VERTIGO 02/22/2010  ? Obstructive sleep apnea 09/27/2009  ? ERECTILE DYSFUNCTION, ORGANIC 09/07/2009  ? CHEST PAIN 03/13/2008  ? OTHER SYMPTOMS INVOLVING DIGESTIVE SYSTEM OTHER 03/12/2008  ? RECTAL BLEEDING, HX OF 03/12/2008  ? Hypothyroidism 02/22/2007  ? Allergic rhinitis 02/22/2007  ? GERD 02/22/2007  ? LOW BACK PAIN 02/22/2007  ? ?Past Medical History:  ?Diagnosis Date  ? Allergic rhinitis   ? Allergy   ? Arthritis   ? back  ? ED (erectile dysfunction)   ? Elevated lipase   ? Enlarged prostate   ? GERD (gastroesophageal reflux disease)   ? Hepatic steatosis   ? Hypothyroidism   ? Low back pain   ? Low testosterone   ? Sleep apnea   ? had sleep study 09-10-16- needs new cpap  ?  ?  Family History  ?Problem Relation Age of Onset  ? Cancer Father   ?     sarcoma- both kidneys removed  ? Diabetes Father   ?     s/p renal transplant  ? Arthritis Mother   ? High blood pressure Mother   ? High Cholesterol Mother   ? Heart disease Mother   ? Diabetes Brother   ? Diabetes Maternal Grandmother   ? Diabetes Paternal Grandmother   ? Cancer Paternal Grandmother   ?     colon  ? Depression Other   ? Diabetes Other   ? Hypertension Other   ? Kidney disease Other   ? Obesity Sister   ? Stroke Maternal Grandfather 60  ?     tobacco abuse  ? Alcohol abuse Maternal Grandfather   ? Early death Paternal Grandfather   ?     accidental  ? Prostate cancer Maternal Uncle   ? Heart attack Maternal Uncle 60  ? Prostate cancer Maternal Uncle   ? Cancer Paternal Uncle   ?     uncertain type  ? Alcohol abuse Paternal Uncle   ? Colon polyps Neg Hx   ? Rectal cancer Neg Hx   ? Stomach  cancer Neg Hx   ? Esophageal cancer Neg Hx   ? Pancreatic cancer Neg Hx   ?  ?Past Surgical History:  ?Procedure Laterality Date  ? COLONOSCOPY  03/19/2008; 2018  ? ext. [facility]   ? HERNIA REPAIR    ? x2 19

## 2021-08-29 ENCOUNTER — Ambulatory Visit: Payer: 59

## 2021-08-30 ENCOUNTER — Ambulatory Visit (INDEPENDENT_AMBULATORY_CARE_PROVIDER_SITE_OTHER): Payer: 59 | Admitting: *Deleted

## 2021-08-30 DIAGNOSIS — E291 Testicular hypofunction: Secondary | ICD-10-CM

## 2021-08-30 DIAGNOSIS — R7989 Other specified abnormal findings of blood chemistry: Secondary | ICD-10-CM

## 2021-08-30 MED ORDER — TESTOSTERONE CYPIONATE 200 MG/ML IM SOLN
200.0000 mg | Freq: Once | INTRAMUSCULAR | Status: AC
  Administered 2021-08-30: 200 mg via INTRAMUSCULAR

## 2021-08-30 NOTE — Progress Notes (Signed)
Per orders of Dr. Fry, injection of Testosterone cypionate 200mg given by Latiana Tomei A. Patient tolerated injection well.  

## 2021-09-08 ENCOUNTER — Ambulatory Visit: Payer: 59 | Admitting: Physical Medicine and Rehabilitation

## 2021-09-08 ENCOUNTER — Encounter: Payer: Self-pay | Admitting: Physical Medicine and Rehabilitation

## 2021-09-08 ENCOUNTER — Ambulatory Visit: Payer: Self-pay

## 2021-09-08 DIAGNOSIS — M545 Low back pain, unspecified: Secondary | ICD-10-CM | POA: Diagnosis not present

## 2021-09-08 DIAGNOSIS — G8929 Other chronic pain: Secondary | ICD-10-CM

## 2021-09-08 MED ORDER — DIAZEPAM 5 MG PO TABS: ORAL_TABLET | ORAL | 0 refills | Status: DC

## 2021-09-08 MED ORDER — METHYLPREDNISOLONE ACETATE 80 MG/ML IJ SUSP
80.0000 mg | Freq: Once | INTRAMUSCULAR | Status: AC
  Administered 2021-09-08: 80 mg

## 2021-09-08 NOTE — Patient Instructions (Signed)

## 2021-09-08 NOTE — Progress Notes (Signed)
Pt state lower back pain.. Pt state walking, standing and laying down. Pt state he takes pian meds to help ease his pain. ? ?Numeric Pain Rating Scale and Functional Assessment ?Average Pain 7 ? ? ?In the last MONTH (on 0-10 scale) has pain interfered with the following? ? ?1. General activity like being  able to carry out your everyday physical activities such as walking, climbing stairs, carrying groceries, or moving a chair?  ?Rating(8) ? ? ?+Driver, -BT, -Dye Allergies. ? ?

## 2021-09-12 ENCOUNTER — Encounter: Payer: Self-pay | Admitting: Family Medicine

## 2021-09-12 ENCOUNTER — Ambulatory Visit (INDEPENDENT_AMBULATORY_CARE_PROVIDER_SITE_OTHER): Payer: 59 | Admitting: Family Medicine

## 2021-09-12 VITALS — BP 134/106 | HR 80 | Temp 98.2°F | Ht 70.75 in | Wt 224.8 lb

## 2021-09-12 DIAGNOSIS — G4733 Obstructive sleep apnea (adult) (pediatric): Secondary | ICD-10-CM

## 2021-09-12 DIAGNOSIS — R7302 Impaired glucose tolerance (oral): Secondary | ICD-10-CM

## 2021-09-12 DIAGNOSIS — E039 Hypothyroidism, unspecified: Secondary | ICD-10-CM | POA: Diagnosis not present

## 2021-09-12 DIAGNOSIS — G8929 Other chronic pain: Secondary | ICD-10-CM

## 2021-09-12 DIAGNOSIS — I10 Essential (primary) hypertension: Secondary | ICD-10-CM

## 2021-09-12 DIAGNOSIS — E785 Hyperlipidemia, unspecified: Secondary | ICD-10-CM

## 2021-09-12 DIAGNOSIS — Z0000 Encounter for general adult medical examination without abnormal findings: Secondary | ICD-10-CM

## 2021-09-12 DIAGNOSIS — R002 Palpitations: Secondary | ICD-10-CM

## 2021-09-12 DIAGNOSIS — K219 Gastro-esophageal reflux disease without esophagitis: Secondary | ICD-10-CM

## 2021-09-12 DIAGNOSIS — R7989 Other specified abnormal findings of blood chemistry: Secondary | ICD-10-CM | POA: Diagnosis not present

## 2021-09-12 DIAGNOSIS — M545 Low back pain, unspecified: Secondary | ICD-10-CM

## 2021-09-12 LAB — CBC WITH DIFFERENTIAL/PLATELET
Basophils Absolute: 0.1 10*3/uL (ref 0.0–0.1)
Basophils Relative: 0.8 % (ref 0.0–3.0)
Eosinophils Absolute: 0.1 10*3/uL (ref 0.0–0.7)
Eosinophils Relative: 0.5 % (ref 0.0–5.0)
HCT: 51.9 % (ref 39.0–52.0)
Hemoglobin: 17.2 g/dL — ABNORMAL HIGH (ref 13.0–17.0)
Lymphocytes Relative: 21.4 % (ref 12.0–46.0)
Lymphs Abs: 2.1 10*3/uL (ref 0.7–4.0)
MCHC: 33.2 g/dL (ref 30.0–36.0)
MCV: 96.2 fl (ref 78.0–100.0)
Monocytes Absolute: 1.1 10*3/uL — ABNORMAL HIGH (ref 0.1–1.0)
Monocytes Relative: 10.6 % (ref 3.0–12.0)
Neutro Abs: 6.7 10*3/uL (ref 1.4–7.7)
Neutrophils Relative %: 66.7 % (ref 43.0–77.0)
Platelets: 315 10*3/uL (ref 150.0–400.0)
RBC: 5.39 Mil/uL (ref 4.22–5.81)
RDW: 14.7 % (ref 11.5–15.5)
WBC: 10.1 10*3/uL (ref 4.0–10.5)

## 2021-09-12 LAB — COMPREHENSIVE METABOLIC PANEL
ALT: 35 U/L (ref 0–53)
AST: 20 U/L (ref 0–37)
Albumin: 5 g/dL (ref 3.5–5.2)
Alkaline Phosphatase: 69 U/L (ref 39–117)
BUN: 20 mg/dL (ref 6–23)
CO2: 31 mEq/L (ref 19–32)
Calcium: 9.7 mg/dL (ref 8.4–10.5)
Chloride: 99 mEq/L (ref 96–112)
Creatinine, Ser: 1.06 mg/dL (ref 0.40–1.50)
GFR: 78.66 mL/min (ref >60.00)
Glucose, Bld: 93 mg/dL (ref 70–99)
Potassium: 4.4 mEq/L (ref 3.5–5.1)
Sodium: 139 mEq/L (ref 135–145)
Total Bilirubin: 0.7 mg/dL (ref 0.2–1.2)
Total Protein: 8.1 g/dL (ref 6.0–8.3)

## 2021-09-12 LAB — LIPID PANEL
Cholesterol: 157 mg/dL (ref 0–200)
HDL: 50.4 mg/dL (ref >39.00)
LDL Cholesterol: 80 mg/dL (ref 0–99)
NonHDL: 106.24
Total CHOL/HDL Ratio: 3
Triglycerides: 132 mg/dL (ref 0.0–149.0)
VLDL: 26.4 mg/dL (ref 0.0–40.0)

## 2021-09-12 LAB — PSA: PSA: 1.54 ng/mL (ref 0.10–4.00)

## 2021-09-12 LAB — HEMOGLOBIN A1C: Hgb A1c MFr Bld: 6.6 % — ABNORMAL HIGH (ref 4.6–6.5)

## 2021-09-12 LAB — TESTOSTERONE: Testosterone: 269.99 ng/dL — ABNORMAL LOW (ref 300.00–890.00)

## 2021-09-12 LAB — TSH: TSH: 2.8 u[IU]/mL (ref 0.35–5.50)

## 2021-09-12 MED ORDER — LEVOTHYROXINE SODIUM 50 MCG PO TABS: ORAL_TABLET | ORAL | 1 refills | Status: DC

## 2021-09-12 MED ORDER — TESTOSTERONE CYPIONATE 200 MG/ML IM SOLN: 200.0000 mg | INTRAMUSCULAR | 1 refills | Status: DC

## 2021-09-12 MED ORDER — LOSARTAN POTASSIUM 25 MG PO TABS: 25.0000 mg | ORAL_TABLET | Freq: Every day | ORAL | 1 refills | Status: DC

## 2021-09-12 NOTE — Progress Notes (Signed)
Walter Jordan. ?DOB: 1965/06/26 ?Encounter date: 09/12/2021 ? ?This is a 56 y.o. male who presents for complete physical  ? ?History of present illness/Additional concerns: ? ?Last visit with me was 01/14/2021. 2 mo ago he felt like heart wasn't beating right; not breathing right and he was told to go to ER, but he didn't go.also was light headed. Does still feel the same way. Just not normal. Has also had more headaches. Symptoms came on and then stayed. Not consistent. Will get a run of fast beats; and sometimes will have a hard beat and then returns to normal. Day he called in he was feeling short of breath. No cough. Not light headed now, but has moments of this. Not sure if associated with heart racing. Feels ok with exercise; just weight lifting on occasion. No chest pain, no pressure. Energy levela  little low. ? ?Has not been consistent with taking medications. Hasn't been checking blood pressure.  ? ? ?Low testosterone: still taking this. Gets urinary med from them; testosterone from [place].  ?Type 2 diabetes: not checking sugars. ? ?Hypothyroid: synthroid 28mg daily ? ?Sleep apnea: does use cpap regularly; hard to put machine on regularly. Feels better when using.  ? ?Acid reflux: sx stable; not taken protonix in awhile.  ?Misses losartan 2-3 days/week. Just forgets. ? ?Had ablation back last thurs; has not taken pain/muscle relaxer since that time. Has appointment this week for right side.  ? ?Colonoscopy is due in May/2023. ? ? ?Past Medical History:  ?Diagnosis Date  ? Allergic rhinitis   ? Allergy   ? Arthritis   ? back  ? ED (erectile dysfunction)   ? Elevated lipase   ? Enlarged prostate   ? GERD (gastroesophageal reflux disease)   ? Hepatic steatosis   ? Hypothyroidism   ? Low back pain   ? Low testosterone   ? Sleep apnea   ? had sleep study 09-10-16- needs new cpap  ? ?Past Surgical History:  ?Procedure Laterality Date  ? COLONOSCOPY  03/19/2008; 2018  ? ext. [facility]   ? HERNIA REPAIR    ? x2 1968,  inguinal 1973  ? WISDOM TOOTH EXTRACTION    ? ?No Known Allergies ?Current Meds  ?Medication Sig  ? alfuzosin (UROXATRAL) 10 MG 24 hr tablet Take 10 mg by mouth daily.  ? cyclobenzaprine (FLEXERIL) 10 MG tablet TAKE 1 TABLET BY MOUTH THREE TIMES A DAY AS NEEDED FOR MUSCLE SPASMS  ? HYDROcodone-acetaminophen (NORCO) 10-325 MG tablet Take 1 tablet by mouth 2 (two) times daily as needed.  ? Multiple Vitamins-Minerals (MEGA MULTIVITAMIN FOR MEN PO) Take by mouth daily.  ? SYRINGE-NEEDLE, DISP, 3 ML (B-D 3CC LUER-LOK SYR 23GX1") 23G X 1" 3 ML MISC Use as directed  ? [DISCONTINUED] diazepam (VALIUM) 5 MG tablet Take one tablet by mouth with food one hour prior to procedure. May repeat 30 minutes prior if needed.  ? [DISCONTINUED] gabapentin (NEURONTIN) 100 MG capsule Take 2 capsules (200 mg total) by mouth at bedtime.  ? [DISCONTINUED] levothyroxine (SYNTHROID) 50 MCG tablet TAKE 1 TABLET BY MOUTH EVERY DAY BEFORE BREAKFAST  ? [DISCONTINUED] losartan (COZAAR) 25 MG tablet TAKE 1 TABLET BY MOUTH EVERY DAY  ? [DISCONTINUED] methylPREDNISolone (MEDROL DOSEPAK) 4 MG TBPK tablet Take as directed  ? [DISCONTINUED] pantoprazole (PROTONIX) 40 MG tablet TAKE 1 TABLET BY MOUTH EVERY DAY  ? [DISCONTINUED] testosterone cypionate (DEPOTESTOSTERONE CYPIONATE) 200 MG/ML injection Inject 1 mL (200 mg total) into the muscle every 21 (  twenty-one) days.  ? ?Current Facility-Administered Medications for the 09/12/21 encounter (Office Visit) with Caren Macadam, MD  ?Medication  ? 0.9 %  sodium chloride infusion  ? methylPREDNISolone acetate (DEPO-MEDROL) injection 80 mg  ? ?Social History  ? ?Tobacco Use  ? Smoking status: Never  ? Smokeless tobacco: Never  ?Substance Use Topics  ? Alcohol use: Yes  ?  Comment: social  ? ?Family History  ?Problem Relation Age of Onset  ? Cancer Father   ?     sarcoma- both kidneys removed  ? Diabetes Father   ?     s/p renal transplant  ? Arthritis Mother   ? High blood pressure Mother   ? High  Cholesterol Mother   ? Heart disease Mother   ? Diabetes Brother   ? Diabetes Maternal Grandmother   ? Diabetes Paternal Grandmother   ? Cancer Paternal Grandmother   ?     colon  ? Depression Other   ? Diabetes Other   ? Hypertension Other   ? Kidney disease Other   ? Obesity Sister   ? Stroke Maternal Grandfather 60  ?     tobacco abuse  ? Alcohol abuse Maternal Grandfather   ? Early death Paternal Grandfather   ?     accidental  ? Prostate cancer Maternal Uncle   ? Heart attack Maternal Uncle 60  ? Prostate cancer Maternal Uncle   ? Cancer Paternal Uncle   ?     uncertain type  ? Alcohol abuse Paternal Uncle   ? Colon polyps Neg Hx   ? Rectal cancer Neg Hx   ? Stomach cancer Neg Hx   ? Esophageal cancer Neg Hx   ? Pancreatic cancer Neg Hx   ? ? ? ?Review of Systems ? ?CBC:  ?Lab Results  ?Component Value Date  ? WBC 4.2 01/07/2021  ? HGB 16.4 01/07/2021  ? HCT 48.7 01/07/2021  ? MCH 31.9 02/06/2020  ? MCHC 33.7 01/07/2021  ? RDW 14.2 01/07/2021  ? PLT 159.0 01/07/2021  ? MPV 10.7 02/06/2020  ? ?CMP: ?Lab Results  ?Component Value Date  ? NA 137 01/07/2021  ? K 4.4 01/07/2021  ? CL 103 01/07/2021  ? CO2 25 01/07/2021  ? GLUCOSE 99 01/07/2021  ? BUN 17 01/07/2021  ? CREATININE 1.16 01/07/2021  ? CREATININE 1.13 12/04/2019  ? CALCIUM 9.3 01/07/2021  ? PROT 6.9 01/28/2021  ? BILITOT 0.7 01/28/2021  ? ALKPHOS 55 01/28/2021  ? ALT 28 01/28/2021  ? AST 22 01/28/2021  ? ?LIPID: ?Lab Results  ?Component Value Date  ? CHOL 148 01/07/2021  ? TRIG 68.0 01/07/2021  ? HDL 48.60 01/07/2021  ? [place] 85 01/07/2021  ? LDLCALC 108 (H) 12/04/2019  ? ? ?Objective: ? ?BP (!) 134/106 Comment: repeat by Mykal--jaf  Pulse 80   Temp 98.2 ?F (36.8 ?C) (Oral)   Ht 5' 10.75" (1.797 m)   Wt 224 lb 12.8 oz (102 kg)   SpO2 100%   BMI 31.58 kg/m?   Weight: 224 lb 12.8 oz (102 kg)  ? ?BP Readings from Last 3 Encounters:  ?09/12/21 (!) 134/106  ?07/27/21 120/80  ?06/30/21 120/88  ? ?Wt Readings from Last 3 Encounters:  ?09/12/21 224 lb  12.8 oz (102 kg)  ?06/30/21 221 lb (100.2 kg)  ?03/23/21 221 lb 1.6 oz (100.3 kg)  ? ? ?Physical Exam ?Constitutional:   ?   General: He is not in acute distress. ?   Appearance: He is  well-developed.  ?HENT:  ?   Head: Normocephalic and atraumatic.  ?   Right Ear: External ear normal.  ?   Left Ear: External ear normal.  ?   Nose: Nose normal.  ?   Mouth/Throat:  ?   Pharynx: No oropharyngeal exudate.  ?Eyes:  ?   Conjunctiva/sclera: Conjunctivae normal.  ?   Pupils: Pupils are equal, round, and reactive to light.  ?Neck:  ?   Thyroid: No thyromegaly.  ?Cardiovascular:  ?   Rate and Rhythm: Normal rate and regular rhythm. FrequentExtrasystoles are present. ?   Heart sounds: Normal heart sounds. No murmur heard. ?  No friction rub. No gallop.  ?Pulmonary:  ?   Effort: Pulmonary effort is normal. No respiratory distress.  ?   Breath sounds: Normal breath sounds. No stridor. No wheezing or rales.  ?Abdominal:  ?   General: Bowel sounds are normal.  ?   Palpations: Abdomen is soft.  ?Musculoskeletal:     ?   General: Normal range of motion.  ?   Cervical back: Neck supple.  ?   Right lower leg: No edema.  ?   Left lower leg: No edema.  ?Skin: ?   General: Skin is warm and dry.  ?   Comments: No abnormal skin moles.  ?Neurological:  ?   Mental Status: He is alert and oriented to person, place, and time.  ?Psychiatric:     ?   Behavior: Behavior normal.     ?   Thought Content: Thought content normal.     ?   Judgment: Judgment normal.  ? ? ?Assessment/Plan: ?Health Maintenance Due  ?Topic Date Due  ? HEMOGLOBIN A1C  07/10/2021  ? ?Health Maintenance reviewed. ? ? ?1. Preventative health care ?He is due in May for repeat colonoscopy.  Last colonoscopy was done with Dr. Loletha Carrow.  Would like for him to keep up with regular exercise, but he is going to need evaluation for ongoing symptoms (see below) ? ?2. Obstructive sleep apnea ?Stressed importance of compliance with CPAP. ? ?3. Gastroesophageal reflux disease, unspecified  whether esophagitis present ?He is no longer taking Protonix and is without symptoms. ? ?4. Hypothyroidism, unspecified type ?Currently taking Synthroid 50 mcg daily.  Recheck levels today. ?- TSH; Future ?- levothyroxine (S

## 2021-09-13 LAB — MICROALBUMIN / CREATININE URINE RATIO
Creatinine,U: 127.1 mg/dL
Microalb Creat Ratio: 0.8 mg/g (ref 0.0–30.0)
Microalb, Ur: 1.1 mg/dL (ref 0.0–1.9)

## 2021-09-15 ENCOUNTER — Ambulatory Visit: Payer: 59 | Admitting: Physical Medicine and Rehabilitation

## 2021-09-15 ENCOUNTER — Encounter: Payer: Self-pay | Admitting: Physical Medicine and Rehabilitation

## 2021-09-15 ENCOUNTER — Ambulatory Visit: Payer: Self-pay

## 2021-09-15 ENCOUNTER — Encounter: Payer: 59 | Admitting: Physical Medicine and Rehabilitation

## 2021-09-15 VITALS — BP 134/84 | HR 78

## 2021-09-15 DIAGNOSIS — M47816 Spondylosis without myelopathy or radiculopathy, lumbar region: Secondary | ICD-10-CM | POA: Diagnosis not present

## 2021-09-15 MED ORDER — METHYLPREDNISOLONE ACETATE 80 MG/ML IJ SUSP
80.0000 mg | Freq: Once | INTRAMUSCULAR | Status: AC
  Administered 2021-09-15: 80 mg

## 2021-09-15 NOTE — Patient Instructions (Signed)

## 2021-09-15 NOTE — Progress Notes (Signed)
Pt state lower back pain.. Pt state walking, standing and laying down. Pt state he takes pian meds to help ease his pain. ? ?Numeric Pain Rating Scale and Functional Assessment ?Average Pain 2 ? ? ?In the last MONTH (on 0-10 scale) has pain interfered with the following? ? ?1. General activity like being  able to carry out your everyday physical activities such as walking, climbing stairs, carrying groceries, or moving a chair?  ?Rating(9) ? ? ?+Driver, -BT, -Dye Allergies. ? ?

## 2021-09-19 NOTE — Procedures (Signed)
Lumbar Facet Joint Nerve Denervation ? ?Patient: Walter Jordan.      ?Date of Birth: [birth year] ?MRN: [id number] ?PCP: Caren Macadam, MD      ?Visit Date: 09/08/2021 ?  ?Universal Protocol:    ?Date/Time: 05/01/235:55 AM ? ?Consent Given By: the patient ? ?Position: PRONE ? ?Additional Comments: ?Vital signs were monitored before and after the procedure. ?Patient was prepped and draped in the usual sterile fashion. ?The correct patient, procedure, and site was verified. ? ? ?Injection Procedure Details:  ? ?Procedure diagnoses:  ?1. Chronic bilateral low back pain without sciatica   ?  ? ?Meds Administered:  ?Meds ordered this encounter  ?Medications  ? methylPREDNISolone acetate (DEPO-MEDROL) injection 80 mg  ? DISCONTD: diazepam (VALIUM) 5 MG tablet  ?  Sig: Take one tablet by mouth with food one hour prior to procedure. May repeat 30 minutes prior if needed.  ?  Dispense:  2 tablet  ?  Refill:  0  ?  Order Specific Question:   Supervising Provider  ?  AnswerMagnus Sinning [[id number]]  ?  ? ?Laterality: Left ? ?Location/Site:  L3-L4, L2 and L3 medial branches, L4-L5, L3 and L4 medial branches, and L5-S1, L4 medial branch and L5 dorsal ramus ? ?Needle: 18 ga.,  20m active tip, 1082mRF Cannula ? ?Needle Placement: Along juncture of superior articular process and transverse pocess ? ?Findings: ? -Comments: ? ?Procedure Details: ?For each desired target nerve, the corresponding transverse process (sacral ala for the L5 dorsal rami) was identified and the fluoroscope was positioned to square off the endplates of the corresponding vertebral body to achieve a true AP midline view.  The beam was then obliqued 15 to 20 degrees and caudally tilted 15 to 20 degrees to line up a trajectory along the target nerves. The skin over the target of the junction of superior articulating process and transverse process (sacral ala for the L5 dorsal rami) was infiltrated with 27m47mf 1% Lidocaine without Epinephrine.  The 18  gauge 70m73mtive tip outer cannula was advanced in trajectory view to the target. ? ?This procedure was repeated for each target nerve.  Then, for all levels, the outer cannula placement was fine-tuned and the position was then confirmed with bi-planar imaging.   ? ?Test stimulation was done both at sensory and motor levels to ensure there was no radicular stimulation. The target tissues were then infiltrated with 1 ml of 1% Lidocaine without Epinephrine. Subsequently, a percutaneous neurotomy was carried out for 90 seconds at 80 degrees Celsius.  After the completion of the lesion, 1 ml of injectate was delivered. It was then repeated for each facet joint nerve mentioned above. Appropriate radiographs were obtained to verify the probe placement during the neurotomy. ? ? ?Additional Comments:  ?The patient tolerated the procedure well ?Dressing: 2 x 2 sterile gauze and Band-Aid ?  ? ?Post-procedure details: ?Patient was observed during the procedure. ?Post-procedure instructions were reviewed. ? ?Patient left the clinic in stable condition. ? ? ? ?

## 2021-09-19 NOTE — Progress Notes (Signed)
? ?Mohab Ashby. - 56 y.o. male MRN [id number]  Date of birth: 11-Oct-1965 ? ?Office Visit Note: ?Visit Date: 09/08/2021 ?PCP: Caren Macadam, MD ?Referred by: Lorine Bears, NP ? ?Subjective: ?Chief Complaint  ?Patient presents with  ? Lower Back - Pain  ? ?HPI:  Marsh Heckler. is a 56 y.o. male who comes in todayfor planned radiofrequency ablation of the Left L3-4, L4-5, and L5-S1 Lumbar facet joints. This would be ablation of the corresponding medial branches and/or dorsal rami.  Patient has had double diagnostic blocks with more than 50% relief.  These are documented on pain diary.  They have had chronic back pain for quite some time, more than 3 months, which has been an ongoing situation with recalcitrant axial back pain.  They have no radicular pain.  Their axial pain is worse with standing and ambulating and on exam today with facet loading.  They have had physical therapy as well as home exercise program.  The imaging noted in the chart below indicated facet pathology. Accordingly they meet all the criteria and qualification for for radiofrequency ablation and we are going to complete this today hopefully for more longer term relief as part of comprehensive management program. ? ?ROS Otherwise per HPI. ? ?Assessment & Plan: ?Visit Diagnoses:  ?  ICD-10-CM   ?1. Chronic bilateral low back pain without sciatica  M54.50 XR C-ARM NO REPORT  ? G89.29 Radiofrequency,Lumbar  ?  methylPREDNISolone acetate (DEPO-MEDROL) injection 80 mg  ?  ?  ?Plan: No additional findings.  ? ?Meds & Orders:  ?Meds ordered this encounter  ?Medications  ? methylPREDNISolone acetate (DEPO-MEDROL) injection 80 mg  ? DISCONTD: diazepam (VALIUM) 5 MG tablet  ?  Sig: Take one tablet by mouth with food one hour prior to procedure. May repeat 30 minutes prior if needed.  ?  Dispense:  2 tablet  ?  Refill:  0  ?  Order Specific Question:   Supervising Provider  ?  AnswerMagnus Sinning [[id number]]  ?  ?Orders Placed This  Encounter  ?Procedures  ? Radiofrequency,Lumbar  ? XR C-ARM NO REPORT  ?  ?Follow-up: Return if symptoms worsen or fail to improve.  ? ?Procedures: ?No procedures performed  ?Lumbar Facet Joint Nerve Denervation ? ?Patient: Isaias Dowson.      ?Date of Birth: 06-Sep-1965 ?MRN: [id number] ?PCP: Caren Macadam, MD      ?Visit Date: 09/08/2021 ?  ?Universal Protocol:    ?Date/Time: 05/01/235:55 AM ? ?Consent Given By: the patient ? ?Position: PRONE ? ?Additional Comments: ?Vital signs were monitored before and after the procedure. ?Patient was prepped and draped in the usual sterile fashion. ?The correct patient, procedure, and site was verified. ? ? ?Injection Procedure Details:  ? ?Procedure diagnoses:  ?1. Chronic bilateral low back pain without sciatica   ?  ? ?Meds Administered:  ?Meds ordered this encounter  ?Medications  ? methylPREDNISolone acetate (DEPO-MEDROL) injection 80 mg  ? DISCONTD: diazepam (VALIUM) 5 MG tablet  ?  Sig: Take one tablet by mouth with food one hour prior to procedure. May repeat 30 minutes prior if needed.  ?  Dispense:  2 tablet  ?  Refill:  0  ?  Order Specific Question:   Supervising Provider  ?  AnswerMagnus Sinning [[id number]]  ?  ? ?Laterality: Left ? ?Location/Site:  L3-L4, L2 and L3 medial branches, L4-L5, L3 and L4 medial branches, and L5-S1, L4 medial branch  and L5 dorsal ramus ? ?Needle: 18 ga.,  50m active tip, 1033mRF Cannula ? ?Needle Placement: Along juncture of superior articular process and transverse pocess ? ?Findings: ? -Comments: ? ?Procedure Details: ?For each desired target nerve, the corresponding transverse process (sacral ala for the L5 dorsal rami) was identified and the fluoroscope was positioned to square off the endplates of the corresponding vertebral body to achieve a true AP midline view.  The beam was then obliqued 15 to 20 degrees and caudally tilted 15 to 20 degrees to line up a trajectory along the target nerves. The skin over the target of  the junction of superior articulating process and transverse process (sacral ala for the L5 dorsal rami) was infiltrated with 9m66mf 1% Lidocaine without Epinephrine.  The 18 gauge 71m69mtive tip outer cannula was advanced in trajectory view to the target. ? ?This procedure was repeated for each target nerve.  Then, for all levels, the outer cannula placement was fine-tuned and the position was then confirmed with bi-planar imaging.   ? ?Test stimulation was done both at sensory and motor levels to ensure there was no radicular stimulation. The target tissues were then infiltrated with 1 ml of 1% Lidocaine without Epinephrine. Subsequently, a percutaneous neurotomy was carried out for 90 seconds at 80 degrees Celsius.  After the completion of the lesion, 1 ml of injectate was delivered. It was then repeated for each facet joint nerve mentioned above. Appropriate radiographs were obtained to verify the probe placement during the neurotomy. ? ? ?Additional Comments:  ?The patient tolerated the procedure well ?Dressing: 2 x 2 sterile gauze and Band-Aid ?  ? ?Post-procedure details: ?Patient was observed during the procedure. ?Post-procedure instructions were reviewed. ? ?Patient left the clinic in stable condition. ? ? ?  ? ?Clinical History: ?MRI LUMBAR SPINE WITHOUT CONTRAST  ?   ?TECHNIQUE:  ?Multiplanar, multisequence MR imaging of the lumbar spine was  ?performed. No intravenous contrast was administered.  ?   ?COMPARISON:  Previous MRI from 07/28/2014.  ?   ?FINDINGS:  ?Segmentation: Standard. Lowest well-formed disc space labeled the  ?L5-S1 level.  ?   ?Alignment: 4 mm retrolisthesis of L5 on S1, with trace  ?retrolisthesis of L2 on L3. Trace anterolisthesis of L3 on L4.  ?Findings chronic and facet mediated. Straightening of the normal  ?lumbar lordosis with underlying mild dextroscoliosis.  ?   ?Vertebrae: Vertebral body height maintained without evidence acute  ?or chronic fracture. Bone marrow signal  intensity somewhat  ?heterogeneous but within normal limits. No discrete or worrisome  ?osseous lesions. Discogenic reactive endplate changes seen about the  ?left aspect of the L4-5 interspace and right aspect of the L5-S1  ?interspace. No other abnormal marrow edema.  ?   ?Conus medullaris and cauda equina: Conus extends to the L1 level.  ?Conus and cauda equina appear normal.  ?   ?Paraspinal and other soft tissues: Paraspinous soft tissues  ?demonstrate no acute finding. T2 hyperintense simple cyst partially  ?visualize within the interpolar left kidney. Visualized visceral  ?structures otherwise unremarkable.  ?   ?Disc levels:  ?   ?L1-2: Negative interspace. Mild facet hypertrophy. No significant  ?canal or foraminal stenosis.  ?   ?L2-3: Chronic intervertebral disc space narrowing with diffuse disc  ?bulge. Superimposed mild reactive endplate changes. Moderate facet  ?hypertrophy. Prominence of the dorsal epidural fat. Changes  ?resultant moderate canal with bilateral lateral recess stenosis,  ?greater on the left. Mild bilateral L2 foraminal narrowing.  ?   ?  L3-4: Chronic intervertebral disc space narrowing with mild diffuse  ?disc bulge. There is a superimposed broad right extraforaminal disc  ?protrusion, closely approximating the exiting right L3 nerve root  ?(series 13, image 23). Moderate bilateral facet hypertrophy.  ?Resultant mild spinal stenosis. Mild to moderate bilateral L3  ?foraminal narrowing, slightly worse on the right.  ?   ?L4-5: Chronic intervertebral disc space narrowing with diffuse disc  ?bulge and disc desiccation. Disc bulging asymmetric to the left with  ?exuberant left-sided endplate osteophytic spurring. Superimposed  ?moderate left worse than right facet hypertrophy. Trace joint  ?effusion on the right. Resultant severe left foraminal stenosis with  ?probable impingement of the left L4 nerve root. Moderate to severe  ?left lateral recess stenosis. Central canal remains patent.  Mild  ?right foraminal narrowing noted.  ?   ?L5-S1: Advanced chronic intervertebral disc space narrowing with  ?diffuse disc bulge and disc desiccation. Extensive reactive endplate  ?changes with marginal endplate o

## 2021-09-19 NOTE — Procedures (Signed)
Lumbar Facet Joint Nerve Denervation ? ?Patient: Walter Jordan.      ?Date of Birth: [birth year] ?MRN: [id number] ?PCP: Caren Macadam, MD      ?Visit Date: 09/15/2021 ?  ?Universal Protocol:    ?Date/Time: 05/01/235:56 AM ? ?Consent Given By: the patient ? ?Position: PRONE ? ?Additional Comments: ?Vital signs were monitored before and after the procedure. ?Patient was prepped and draped in the usual sterile fashion. ?The correct patient, procedure, and site was verified. ? ? ?Injection Procedure Details:  ? ?Procedure diagnoses:  ?1. Spondylosis without myelopathy or radiculopathy, lumbar region   ?  ? ?Meds Administered:  ?Meds ordered this encounter  ?Medications  ? methylPREDNISolone acetate (DEPO-MEDROL) injection 80 mg  ?  ? ?Laterality: Right ? ?Location/Site:  L3-L4, L2 and L3 medial branches, L4-L5, L3 and L4 medial branches, and L5-S1, L4 medial branch and L5 dorsal ramus ? ?Needle: 18 ga.,  29m active tip, 1065mRF Cannula ? ?Needle Placement: Along juncture of superior articular process and transverse pocess ? ?Findings: ? -Comments: ? ?Procedure Details: ?For each desired target nerve, the corresponding transverse process (sacral ala for the L5 dorsal rami) was identified and the fluoroscope was positioned to square off the endplates of the corresponding vertebral body to achieve a true AP midline view.  The beam was then obliqued 15 to 20 degrees and caudally tilted 15 to 20 degrees to line up a trajectory along the target nerves. The skin over the target of the junction of superior articulating process and transverse process (sacral ala for the L5 dorsal rami) was infiltrated with 93m64mf 1% Lidocaine without Epinephrine.  The 18 gauge 73m24mtive tip outer cannula was advanced in trajectory view to the target. ? ?This procedure was repeated for each target nerve.  Then, for all levels, the outer cannula placement was fine-tuned and the position was then confirmed with bi-planar imaging.   ? ?Test  stimulation was done both at sensory and motor levels to ensure there was no radicular stimulation. The target tissues were then infiltrated with 1 ml of 1% Lidocaine without Epinephrine. Subsequently, a percutaneous neurotomy was carried out for 90 seconds at 80 degrees Celsius.  After the completion of the lesion, 1 ml of injectate was delivered. It was then repeated for each facet joint nerve mentioned above. Appropriate radiographs were obtained to verify the probe placement during the neurotomy. ? ? ?Additional Comments:  ?The patient tolerated the procedure well ?Dressing: 2 x 2 sterile gauze and Band-Aid ?  ? ?Post-procedure details: ?Patient was observed during the procedure. ?Post-procedure instructions were reviewed. ? ?Patient left the clinic in stable condition. ? ? ? ?

## 2021-09-19 NOTE — Progress Notes (Signed)
? ?Walter Jordan. - 56 y.o. male MRN [id number]  Date of birth: 1965-10-06 ? ?Office Visit Note: ?Visit Date: 09/15/2021 ?PCP: Caren Macadam, MD ?Referred by: Caren Macadam, MD ? ?Subjective: ?Chief Complaint  ?Patient presents with  ? Lower Back - Pain  ? ?HPI:  Walter Jordan. is a 56 y.o. male who comes in todayfor planned radiofrequency ablation of the Right L3-4, L4-5, and L5-S1 Lumbar facet joints. This would be ablation of the corresponding medial branches and/or dorsal rami.  Patient has had double diagnostic blocks with more than 50% relief.  These are documented on pain diary.  They have had chronic back pain for quite some time, more than 3 months, which has been an ongoing situation with recalcitrant axial back pain.  They have no radicular pain.  Their axial pain is worse with standing and ambulating and on exam today with facet loading.  They have had physical therapy as well as home exercise program.  The imaging noted in the chart below indicated facet pathology. Accordingly they meet all the criteria and qualification for for radiofrequency ablation and we are going to complete this today hopefully for more longer term relief as part of comprehensive management program. ? ?ROS Otherwise per HPI. ? ?Assessment & Plan: ?Visit Diagnoses:  ?  ICD-10-CM   ?1. Spondylosis without myelopathy or radiculopathy, lumbar region  M47.816 XR C-ARM NO REPORT  ?  Radiofrequency,Lumbar  ?  methylPREDNISolone acetate (DEPO-MEDROL) injection 80 mg  ?  ?  ?Plan: No additional findings.  ? ?Meds & Orders:  ?Meds ordered this encounter  ?Medications  ? methylPREDNISolone acetate (DEPO-MEDROL) injection 80 mg  ?  ?Orders Placed This Encounter  ?Procedures  ? Radiofrequency,Lumbar  ? XR C-ARM NO REPORT  ?  ?Follow-up: Return if symptoms worsen or fail to improve.  ? ?Procedures: ?No procedures performed  ?Lumbar Facet Joint Nerve Denervation ? ?Patient: Walter Jordan.      ?Date of Birth: [birth year] ?MRN:  [id number] ?PCP: Caren Macadam, MD      ?Visit Date: 09/15/2021 ?  ?Universal Protocol:    ?Date/Time: 05/01/235:56 AM ? ?Consent Given By: the patient ? ?Position: PRONE ? ?Additional Comments: ?Vital signs were monitored before and after the procedure. ?Patient was prepped and draped in the usual sterile fashion. ?The correct patient, procedure, and site was verified. ? ? ?Injection Procedure Details:  ? ?Procedure diagnoses:  ?1. Spondylosis without myelopathy or radiculopathy, lumbar region   ?  ? ?Meds Administered:  ?Meds ordered this encounter  ?Medications  ? methylPREDNISolone acetate (DEPO-MEDROL) injection 80 mg  ?  ? ?Laterality: Right ? ?Location/Site:  L3-L4, L2 and L3 medial branches, L4-L5, L3 and L4 medial branches, and L5-S1, L4 medial branch and L5 dorsal ramus ? ?Needle: 18 ga.,  28m active tip, [id number] Cannula ? ?Needle Placement: Along juncture of superior articular process and transverse pocess ? ?Findings: ? -Comments: ? ?Procedure Details: ?For each desired target nerve, the corresponding transverse process (sacral ala for the L5 dorsal rami) was identified and the fluoroscope was positioned to square off the endplates of the corresponding vertebral body to achieve a true AP midline view.  The beam was then obliqued 15 to 20 degrees and caudally tilted 15 to 20 degrees to line up a trajectory along the target nerves. The skin over the target of the junction of superior articulating process and transverse process (sacral ala for the L5 dorsal rami) was infiltrated with 47m72m  of 1% Lidocaine without Epinephrine.  The 18 gauge 14m active tip outer cannula was advanced in trajectory view to the target. ? ?This procedure was repeated for each target nerve.  Then, for all levels, the outer cannula placement was fine-tuned and the position was then confirmed with bi-planar imaging.   ? ?Test stimulation was done both at sensory and motor levels to ensure there was no radicular stimulation.  The target tissues were then infiltrated with 1 ml of 1% Lidocaine without Epinephrine. Subsequently, a percutaneous neurotomy was carried out for 90 seconds at 80 degrees Celsius.  After the completion of the lesion, 1 ml of injectate was delivered. It was then repeated for each facet joint nerve mentioned above. Appropriate radiographs were obtained to verify the probe placement during the neurotomy. ? ? ?Additional Comments:  ?The patient tolerated the procedure well ?Dressing: 2 x 2 sterile gauze and Band-Aid ?  ? ?Post-procedure details: ?Patient was observed during the procedure. ?Post-procedure instructions were reviewed. ? ?Patient left the clinic in stable condition. ? ? ?  ? ?Clinical History: ?MRI LUMBAR SPINE WITHOUT CONTRAST  ?   ?TECHNIQUE:  ?Multiplanar, multisequence MR imaging of the lumbar spine was  ?performed. No intravenous contrast was administered.  ?   ?COMPARISON:  Previous MRI from 07/28/2014.  ?   ?FINDINGS:  ?Segmentation: Standard. Lowest well-formed disc space labeled the  ?L5-S1 level.  ?   ?Alignment: 4 mm retrolisthesis of L5 on S1, with trace  ?retrolisthesis of L2 on L3. Trace anterolisthesis of L3 on L4.  ?Findings chronic and facet mediated. Straightening of the normal  ?lumbar lordosis with underlying mild dextroscoliosis.  ?   ?Vertebrae: Vertebral body height maintained without evidence acute  ?or chronic fracture. Bone marrow signal intensity somewhat  ?heterogeneous but within normal limits. No discrete or worrisome  ?osseous lesions. Discogenic reactive endplate changes seen about the  ?left aspect of the L4-5 interspace and right aspect of the L5-S1  ?interspace. No other abnormal marrow edema.  ?   ?Conus medullaris and cauda equina: Conus extends to the L1 level.  ?Conus and cauda equina appear normal.  ?   ?Paraspinal and other soft tissues: Paraspinous soft tissues  ?demonstrate no acute finding. T2 hyperintense simple cyst partially  ?visualize within the interpolar  left kidney. Visualized visceral  ?structures otherwise unremarkable.  ?   ?Disc levels:  ?   ?L1-2: Negative interspace. Mild facet hypertrophy. No significant  ?canal or foraminal stenosis.  ?   ?L2-3: Chronic intervertebral disc space narrowing with diffuse disc  ?bulge. Superimposed mild reactive endplate changes. Moderate facet  ?hypertrophy. Prominence of the dorsal epidural fat. Changes  ?resultant moderate canal with bilateral lateral recess stenosis,  ?greater on the left. Mild bilateral L2 foraminal narrowing.  ?   ?L3-4: Chronic intervertebral disc space narrowing with mild diffuse  ?disc bulge. There is a superimposed broad right extraforaminal disc  ?protrusion, closely approximating the exiting right L3 nerve root  ?(series 13, image 23). Moderate bilateral facet hypertrophy.  ?Resultant mild spinal stenosis. Mild to moderate bilateral L3  ?foraminal narrowing, slightly worse on the right.  ?   ?L4-5: Chronic intervertebral disc space narrowing with diffuse disc  ?bulge and disc desiccation. Disc bulging asymmetric to the left with  ?exuberant left-sided endplate osteophytic spurring. Superimposed  ?moderate left worse than right facet hypertrophy. Trace joint  ?effusion on the right. Resultant severe left foraminal stenosis with  ?probable impingement of the left L4 nerve root. Moderate to severe  ?  left lateral recess stenosis. Central canal remains patent. Mild  ?right foraminal narrowing noted.  ?   ?L5-S1: Advanced chronic intervertebral disc space narrowing with  ?diffuse disc bulge and disc desiccation. Extensive reactive endplate  ?changes with marginal endplate osteophytic spurring. Mild bilateral  ?facet hypertrophy with associated trace joint effusions. Broad-based  ?posterior disc osteophyte contacts the descending S1 nerve roots  ?bilaterally without frank neural impingement or displacement.  ?Moderate to severe bilateral L5 foraminal stenosis.  ?   ?IMPRESSION:  ?1. Left eccentric disc  osteophyte and facet hypertrophy at L4-5 with  ?resultant severe left foraminal stenosis, with moderate to severe  ?left lateral recess narrowing. Either the left L4 or descending L5  ?nerve roots could be affe

## 2021-09-22 ENCOUNTER — Ambulatory Visit (INDEPENDENT_AMBULATORY_CARE_PROVIDER_SITE_OTHER): Payer: 59

## 2021-09-22 DIAGNOSIS — R7989 Other specified abnormal findings of blood chemistry: Secondary | ICD-10-CM | POA: Diagnosis not present

## 2021-09-22 MED ORDER — TESTOSTERONE CYPIONATE 200 MG/ML IM SOLN
200.0000 mg | INTRAMUSCULAR | Status: DC
  Administered 2021-09-22: 200 mg via INTRAMUSCULAR

## 2021-09-22 NOTE — Progress Notes (Signed)
Per orders of Dr. Jerilee Hoh, injection of Testosterone Cypionate 200 mg/ml given by Doneshia Hill L Jadan Hinojos. ?Patient tolerated injection well.  ?

## 2021-10-05 ENCOUNTER — Other Ambulatory Visit: Payer: Self-pay | Admitting: Family Medicine

## 2021-10-05 MED ORDER — XYOSTED 75 MG/0.5ML ~~LOC~~ SOAJ: 75.0000 mg | SUBCUTANEOUS | 5 refills | Status: DC

## 2021-10-10 ENCOUNTER — Ambulatory Visit (INDEPENDENT_AMBULATORY_CARE_PROVIDER_SITE_OTHER): Payer: 59

## 2021-10-10 ENCOUNTER — Ambulatory Visit: Payer: 59 | Admitting: Family Medicine

## 2021-10-10 ENCOUNTER — Encounter: Payer: Self-pay | Admitting: Family Medicine

## 2021-10-10 VITALS — BP 110/78 | HR 80 | Temp 98.4°F | Ht 70.75 in | Wt 225.2 lb

## 2021-10-10 DIAGNOSIS — E119 Type 2 diabetes mellitus without complications: Secondary | ICD-10-CM | POA: Diagnosis not present

## 2021-10-10 DIAGNOSIS — E039 Hypothyroidism, unspecified: Secondary | ICD-10-CM

## 2021-10-10 DIAGNOSIS — E785 Hyperlipidemia, unspecified: Secondary | ICD-10-CM

## 2021-10-10 DIAGNOSIS — R002 Palpitations: Secondary | ICD-10-CM

## 2021-10-10 DIAGNOSIS — R7989 Other specified abnormal findings of blood chemistry: Secondary | ICD-10-CM | POA: Diagnosis not present

## 2021-10-10 MED ORDER — TESTOSTERONE CYPIONATE 200 MG/ML IM SOLN: 400.0000 mg | INTRAMUSCULAR | 2 refills | Status: DC

## 2021-10-10 MED ORDER — EMPAGLIFLOZIN 10 MG PO TABS: 10.0000 mg | ORAL_TABLET | Freq: Every day | ORAL | 1 refills | Status: DC

## 2021-10-10 NOTE — Progress Notes (Unsigned)
Walter Jordan. DOB: 03-25-1966 Encounter date: 10/10/2021  This is a 56 y.o. male who presents with Chief Complaint  Patient presents with   Follow-up    History of present illness:  We did increase the testosterone to '400mg'$  dose due to recent lows. He has not started this new dose yet.   Has been doing more motorcycle riding.   Hasn't scheduled colonoscopy yet, but knows he is due for repeat this month.  He has been working on [employer]. Interested in preventative health measures.  He was scheduled for follow-up for lab review, but came in earlier today and was directed.  However, due to blood sugar elevating and other health concerns, there are some chronic conditions that we will review today.  No Known Allergies Current Meds  Medication Sig   alfuzosin (UROXATRAL) 10 MG 24 hr tablet Take 10 mg by mouth daily.   cyclobenzaprine (FLEXERIL) 10 MG tablet TAKE 1 TABLET BY MOUTH THREE TIMES A DAY AS NEEDED FOR MUSCLE SPASMS   empagliflozin (JARDIANCE) 10 MG TABS tablet Take 1 tablet (10 mg total) by mouth daily before breakfast.   HYDROcodone-acetaminophen (NORCO) 10-325 MG tablet Take 1 tablet by mouth 2 (two) times daily as needed.   levothyroxine (SYNTHROID) 50 MCG tablet TAKE 1 TABLET BY MOUTH EVERY DAY BEFORE BREAKFAST   losartan (COZAAR) 25 MG tablet Take 1 tablet (25 mg total) by mouth daily.   Multiple Vitamins-Minerals (MEGA MULTIVITAMIN FOR MEN PO) Take by mouth daily.   SYRINGE-NEEDLE, DISP, 3 ML (B-D 3CC LUER-LOK SYR 23GX1") 23G X 1" 3 ML MISC Use as directed   testosterone cypionate (DEPOTESTOSTERONE CYPIONATE) 200 MG/ML injection Inject 1 mL (200 mg total) into the muscle every 21 ( twenty-one) days.   [DISCONTINUED] Testosterone Enanthate (XYOSTED) 75 MG/0.5ML SOAJ Inject 75 mg into the skin once a week.   Current Facility-Administered Medications for the 10/10/21 encounter (Office Visit) with Caren Macadam, MD  Medication   0.9 %  sodium chloride  infusion   testosterone cypionate (DEPOTESTOSTERONE CYPIONATE) injection 200 mg    Review of Systems  Constitutional:  Negative for chills, fatigue and fever.  Respiratory:  Negative for cough, chest tightness, shortness of breath and wheezing.   Cardiovascular:  Negative for chest pain, palpitations and leg swelling.   Objective:  BP 110/78 (BP Location: Left Arm, Patient Position: Sitting, Cuff Size: Large)   Pulse 80   Temp 98.4 F (36.9 C) (Oral)   Ht 5' 10.75" (1.797 m)   Wt 225 lb 3.2 oz (102.2 kg)   SpO2 98%   BMI 31.63 kg/m   Weight: 225 lb 3.2 oz (102.2 kg)   BP Readings from Last 3 Encounters:  10/10/21 110/78  09/15/21 134/84  09/12/21 (!) 134/106   Wt Readings from Last 3 Encounters:  10/10/21 225 lb 3.2 oz (102.2 kg)  09/12/21 224 lb 12.8 oz (102 kg)  06/30/21 221 lb (100.2 kg)    Physical Exam Constitutional:      Appearance: Normal appearance.  Pulmonary:     Effort: Pulmonary effort is normal.  Neurological:     Mental Status: He is alert.    Assessment/Plan   1. Controlled type 2 diabetes mellitus without complication, without long-term current use of insulin ([place]) He is interested in some weight loss and staying on track with healthy eating.  We discussed importance of regular exercise and portion/carbohydrate control.  He is interested in taking medication to help with blood sugar control and potentially  weight loss, so Jardiance was started today.  Samples given. Discussed new medication(s) today with patient. Discussed potential side effects and patient verbalized understanding.  We discussed that it is likely with where his A1c is at that he will be able to control sugars without medication.  I do feel if he is able to lose weight, he may be able to come off with the Jardiance and control sugars with diet and exercise alone.  We discussed importance for cholesterol control with diagnosis of diabetes.  He is just over the edge of diagnosis of  diabetes, and cholesterol is generally been very well controlled with diet alone, so I do not feel he needs to start a statin today.  We discussed that if his LDL is not at goal on recheck and his sugar is remaining in the diabetic range, that he should start on a statin, to which she is agreeable (we discussed low-dose Crestor even just a few days a week) - empagliflozin (JARDIANCE) 10 MG TABS tablet; Take 1 tablet (10 mg total) by mouth daily before breakfast.  Dispense: 90 tablet; Refill: 1 - Hemoglobin A1c; Future - CBC with Differential/Platelet; Future  2. Hypothyroidism, unspecified type Continue current dose of Synthroid.  3. Low testosterone in male Testosterone is not been well replaced.  Insurance will not cover [employer].  We will increase testosterone to 400 mg. - Testosterone; Future - PSA; Future - testosterone cypionate (DEPOTESTOSTERONE CYPIONATE) 200 MG/ML injection; Inject 2 mLs (400 mg total) into the muscle every 21 ( twenty-one) days.  Dispense: 10 mL; Refill: 2  4. Hyperlipidemia, unspecified hyperlipidemia type See above. - Lipid panel; Future - Comprehensive metabolic panel; Future   Jardiance samples: [id number] x 2, [id number], [id number]  Return in about 6 months (around 04/12/2022) for bloodwork in 6 months; ok to establish care any time with new doc. 30 minutes chart review time, discussion of treatment options for diabetes and cholesterol, discussion of lifestyle changes and long-term plan for control of blood sugar and cholesterol, charting time.    Micheline Rough, MD

## 2021-10-13 LAB — HM DIABETES EYE EXAM

## 2021-10-14 ENCOUNTER — Ambulatory Visit (HOSPITAL_BASED_OUTPATIENT_CLINIC_OR_DEPARTMENT_OTHER): Payer: 59 | Admitting: Cardiology

## 2021-10-14 ENCOUNTER — Encounter (HOSPITAL_BASED_OUTPATIENT_CLINIC_OR_DEPARTMENT_OTHER): Payer: Self-pay | Admitting: Cardiology

## 2021-10-14 VITALS — BP 122/84 | HR 68 | Ht 71.0 in | Wt 220.4 lb

## 2021-10-14 DIAGNOSIS — R002 Palpitations: Secondary | ICD-10-CM | POA: Diagnosis not present

## 2021-10-14 DIAGNOSIS — Z7189 Other specified counseling: Secondary | ICD-10-CM | POA: Diagnosis not present

## 2021-10-14 DIAGNOSIS — E119 Type 2 diabetes mellitus without complications: Secondary | ICD-10-CM | POA: Diagnosis not present

## 2021-10-14 DIAGNOSIS — I10 Essential (primary) hypertension: Secondary | ICD-10-CM

## 2021-10-14 DIAGNOSIS — Z79899 Other long term (current) drug therapy: Secondary | ICD-10-CM

## 2021-10-14 MED ORDER — ASPIRIN 81 MG PO TBEC: 81.0000 mg | DELAYED_RELEASE_TABLET | Freq: Every day | ORAL | 3 refills | Status: DC

## 2021-10-14 MED ORDER — ROSUVASTATIN CALCIUM 5 MG PO TABS: 5.0000 mg | ORAL_TABLET | Freq: Every day | ORAL | 3 refills | Status: DC

## 2021-10-14 NOTE — Progress Notes (Signed)
Cardiology Office Note:    Date:  10/14/2021   ID:  Walter Jordan., DOB 09/16/1965, MRN [id number]  PCP:  Caren Macadam, MD  Cardiologist:  Buford Dresser, MD  Referring MD: Caren Macadam, MD   CC: Hypertension and palpitations  History of Present Illness:    Walter Jordan. is a 56 y.o. male with a hx of GERD, hypothyroidism, arthritis, hepatic steatosis, and sleep apnea, who is seen as a new consult at the request of Caren Macadam, MD for the evaluation and management of hypertension and palpitations.  He saw Dr. Ethlyn Gallery on 09/12/2021 and was noted to have frequent early beats on exam. He was referred to cardiology for further evaluation due to his age and other risk factors.  Tachycardia/palpitations: -Initial onset: 4-5 months ago -Frequency/Duration: Irregularly, duration can be short or the entire day. He had only one severe episode lasting the whole day.    -Associated symptoms: With his all day severe episode he had vertigo, sweating, and fatigue. -Aggravating/alleviating factors: Patient notices irregular heartbeat when lying on left side. He describes his palpitations as feeling a "hesitation" prior to the next heart beat. -Syncope/near syncope: No syncope -Prior cardiac history: none -Caffeine: 1 cup of coffee a day -Alcohol: 1 drink a month -Tobacco: Never smoked -OTC supplements: No plans to take any natural supplements for health benefit.  -Comorbidities: Blood pressure was 122/84. Blood pressure has only been high the last few months. He has been on losartan for about 6 months. -Exercise level: walking occasionally. Patient can walk perfectly well and doesn't feel like he has any exercise limitations.  -Labs: TSH, kidney function/electrolytes, CBC reviewed. -Cardiac ROS: no chest pain, no shortness of breath, no PND, no orthopnea, no LE edema. -Family history:  Grandmother on maternal side needed a open heart bypass (CABG). Uncle on maternal  side has needed multiple heart ablations.  He reports that he has finished using his monitor today, and will mail it in soon.  He denies any  chest pain, shortness of breath, or peripheral edema. No headaches, syncope, orthopnea, or PND.    Past Medical History:  Diagnosis Date   Allergic rhinitis    Allergy    Arthritis    back   ED (erectile dysfunction)    Elevated lipase    Enlarged prostate    GERD (gastroesophageal reflux disease)    Hepatic steatosis    Hypothyroidism    Low back pain    Low testosterone    Sleep apnea    had sleep study 09-10-16- needs new cpap    Past Surgical History:  Procedure Laterality Date   COLONOSCOPY  03/19/2008; 2018   ext. hems    HERNIA REPAIR     x2 1968, inguinal 1973   WISDOM TOOTH EXTRACTION      Current Medications: Current Outpatient Medications on File Prior to Visit  Medication Sig   alfuzosin (UROXATRAL) 10 MG 24 hr tablet Take 10 mg by mouth daily.   cyclobenzaprine (FLEXERIL) 10 MG tablet TAKE 1 TABLET BY MOUTH THREE TIMES A DAY AS NEEDED FOR MUSCLE SPASMS   empagliflozin (JARDIANCE) 10 MG TABS tablet Take 1 tablet (10 mg total) by mouth daily before breakfast.   HYDROcodone-acetaminophen (NORCO) 10-325 MG tablet Take 1 tablet by mouth 2 (two) times daily as needed.   levothyroxine (SYNTHROID) 50 MCG tablet TAKE 1 TABLET BY MOUTH EVERY DAY BEFORE BREAKFAST   losartan (COZAAR) 25 MG tablet Take 1 tablet (25  mg total) by mouth daily.   Multiple Vitamins-Minerals (MEGA MULTIVITAMIN FOR MEN PO) Take by mouth daily.   SYRINGE-NEEDLE, DISP, 3 ML (B-D 3CC LUER-LOK SYR 23GX1") 23G X 1" 3 ML MISC Use as directed   testosterone cypionate (DEPOTESTOSTERONE CYPIONATE) 200 MG/ML injection Inject 2 mLs (400 mg total) into the muscle every 21 ( twenty-one) days.   Current Facility-Administered Medications on File Prior to Visit  Medication   0.9 %  sodium chloride infusion     Allergies:   Patient has no known allergies.   Social  History   Tobacco Use   Smoking status: Never   Smokeless tobacco: Never  Vaping Use   Vaping Use: Never used  Substance Use Topics   Alcohol use: Yes    Comment: social   Drug use: No    Family History: family history includes Alcohol abuse in his maternal grandfather and paternal uncle; Arthritis in his mother; Cancer in his father, paternal grandmother, and paternal uncle; Depression in an other family member; Diabetes in his brother, father, maternal grandmother, paternal grandmother, and another family member; Early death in his paternal grandfather; Heart attack (age of onset: 104) in his maternal uncle; Heart disease in his mother; High Cholesterol in his mother; High blood pressure in his mother; Hypertension in an other family member; Kidney disease in an other family member; Obesity in his sister; Prostate cancer in his maternal uncle and maternal uncle; Stroke (age of onset: 33) in his maternal grandfather. There is no history of Colon polyps, Rectal cancer, Stomach cancer, Esophageal cancer, or Pancreatic cancer.  ROS:   Please see the history of present illness.  Additional pertinent ROS: Constitutional: Negative for chills, fever, night sweats, unintentional weight loss  HENT: Negative for ear pain and hearing loss.   Eyes: Negative for loss of vision and eye pain.  Respiratory: Negative for cough, sputum, wheezing.   Cardiovascular: See HPI. Positive for palpitations. Gastrointestinal: Negative for abdominal pain, melena, and hematochezia.  Genitourinary: Negative for dysuria and hematuria.  Musculoskeletal: Negative for falls and myalgias. Positive for fatigue and excessive sweating. Skin: Negative for itching and rash.  Neurological: Negative for focal weakness, focal sensory changes and loss of consciousness. Positive of dizziness and nausea. Endo/Heme/Allergies: Does not bruise/bleed easily.     EKGs/Labs/Other Studies Reviewed:    The following studies were  reviewed today:  X-Ray Chest 06/14/20:  FINDINGS: The cardiomediastinal silhouette is within normal limits. No airspace consolidation, edema, pleural effusion, or pneumothorax is identified. No acute osseous abnormality is seen.   IMPRESSION: No active cardiopulmonary disease.   EKG:  EKG is personally reviewed.    10/14/21: NSR at 68 bpm  Recent Labs: 09/12/2021: ALT 35; BUN 20; Creatinine, Ser 1.06; Hemoglobin 17.2; Platelets 315.0; Potassium 4.4; Sodium 139; TSH 2.80  Recent Lipid Panel    Component Value Date/Time   CHOL 157 09/12/2021 1259   TRIG 132.0 09/12/2021 1259   HDL 50.40 09/12/2021 1259   CHOLHDL 3 09/12/2021 1259   VLDL 26.4 09/12/2021 1259   LDLCALC 80 09/12/2021 1259   LDLCALC 108 (H) 12/04/2019 0928    Physical Exam:    VS:  BP 122/84 (BP Location: Right Arm, Patient Position: Sitting, Cuff Size: Large)   Pulse 68   Ht '5\' 11"'$  (1.803 m)   Wt 220 lb 6.4 oz (100 kg)   BMI 30.74 kg/m     Wt Readings from Last 3 Encounters:  10/14/21 220 lb 6.4 oz (100 kg)  10/10/21 225  lb 3.2 oz (102.2 kg)  09/12/21 224 lb 12.8 oz (102 kg)    GEN: Well nourished, well developed in no acute distress HEENT: Normal, moist mucous membranes NECK: No JVD CARDIAC: regular rhythm, normal S1 and S2, no rubs or gallops. No murmur. VASCULAR: Radial and DP pulses 2+ bilaterally. No carotid bruits RESPIRATORY:  Clear to auscultation without rales, wheezing or rhonchi  ABDOMEN: Soft, non-tender, non-distended MUSCULOSKELETAL:  Ambulates independently SKIN: Warm and dry, no edema NEUROLOGIC:  Alert and oriented x 3. No focal neuro deficits noted. PSYCHIATRIC:  Normal affect    ASSESSMENT:    1. Heart palpitations   2. Type 2 diabetes mellitus without complication, without long-term current use of insulin (HCC)   3. Cardiac risk counseling   4. Counseling on health promotion and disease prevention   5. Essential hypertension   6. Medication management    PLAN:     Palpitations: -just completed monitor, plans to mail in shortly. -I am not sure who is assigned to read this, but I will watch for the results. He will also contact me when he gets the results  Type II diabetes -On Jardiance, last A1c 6.6 -we discussed recommendations for statin and aspirin in diabetes. He is amenable. Will start rosuvastatin 5 mg daily, recheck lipids/LFTs in 2-3 mos -start aspirin 81 mg daily  Hypertension -On losartan, just near goal today  Cardiac risk counseling and prevention recommendations: -recommend heart healthy/Mediterranean diet, with whole grains, fruits, vegetable, fish, lean meats, nuts, and olive oil. Limit salt. -recommend moderate walking, 3-5 times/week for 30-50 minutes each session. Aim for at least 150 minutes.week. Goal should be pace of 3 miles/hours, or walking 1.5 miles in 30 minutes -recommend avoidance of tobacco products. Avoid excess alcohol. -ASCVD risk score: The 10-year ASCVD risk score (Arnett DK, et al., 2019) is: 17.8%   Values used to calculate the score:     Age: 27 years     Sex: Male     Is Non-Hispanic African American: Yes     Diabetic: Yes     Tobacco smoker: No     Systolic Blood Pressure: 638 mmHg     Is BP treated: Yes     HDL Cholesterol: 50.4 mg/dL     Total Cholesterol: 157 mg/dL    Plan for follow up: TBD based on results of testing.  Buford Dresser, MD, PhD, [facility]    Medication Adjustments/Labs and Tests Ordered: Current medicines are reviewed at length with the patient today.  Concerns regarding medicines are outlined above.  Orders Placed This Encounter  Procedures   Lipid panel   Hepatic function panel   EKG 12-Lead   Meds ordered this encounter  Medications   rosuvastatin (CRESTOR) 5 MG tablet    Sig: Take 1 tablet (5 mg total) by mouth daily.    Dispense:  90 tablet    Refill:  3   aspirin EC 81 MG tablet    Sig: Take 1 tablet (81 mg total) by mouth daily.  Swallow whole.    Dispense:  90 tablet    Refill:  3    Patient Instructions  Medication Instructions:  START: Aspirin 81 mg daily               Rosuvastatin 5 mg daily *If you need a refill on your cardiac medications before your next appointment, please call your pharmacy*   Lab Work: Your provider has recommended lab work in August, 2023 (  Lipid, LFT). Please have this collected at [facility] at [place]. The lab is open 8:00 am - 4:30 pm. Please avoid 12:00p - 1:00p for lunch hour. You do not need an appointment. Please go to [address] [place], [place] [address]. This is in the Primary Care office on the 3rd floor, let them know you are there for blood work and they will direct you to the lab.  If you have labs (blood work) drawn today and your tests are completely normal, you will receive your results only by: [place] (if you have MyChart) OR A paper copy in the mail If you have any lab test that is abnormal or we need to change your treatment, we will call you to review the results.   Testing/Procedures: None ordered today   Follow-Up: At [facility], you and your health needs are our priority.  As part of our continuing mission to provide you with exceptional heart care, we have created designated Provider Care Teams.  These Care Teams include your primary Cardiologist (physician) and Advanced Practice Providers (APPs -  Physician Assistants and Nurse Practitioners) who all work together to provide you with the care you need, when you need it.  We recommend signing up for the patient portal called "MyChart".  Sign up information is provided on this After Visit Summary.  MyChart is used to connect with patients for Virtual Visits (Telemedicine).  Patients are able to view lab/test results, encounter notes, upcoming appointments, etc.  Non-urgent messages can be sent to your provider as well.   To learn more about what you can do with  MyChart, go to [email or web address].    Your next appointment:   Based on test results  The format for your next appointment:   In Person  Provider:   Buford Dresser, MD   Recent data, summarized from [facility] from a study from the [facility]:  https://wilkins.info/  "Researchers at the [facility] set out to answer this question by comparing statins to supplements in a clinical trial. They tracked the outcomes of 190 adults, ages 55 to 67. Some participants were given a 5 mg daily dose of rosuvastatin, a statin that is sold under the brand name Crestor for 28 days. Others were given supplements, including fish oil, cinnamon, garlic, turmeric, plant sterols or red yeast rice for the same period."  "What we found was that rosuvastatin lowered LDL cholesterol by almost 38% and that was vastly superior to placebo and any of the six supplements studied in the trial," study Despina Hidden, M.D. of the [facility], [place] told NPR. He says this level of reduction is enough to lower the risk of heart attacks and strokes. The findings are published in the Journal of the [employer] of Cardiology.  "Oftentimes these supplements are marketed as 'natural ways' to lower your cholesterol," says Laffin. But he says none of the dietary supplements demonstrated any significant decrease in LDL cholesterol compared with a placebo. LDL cholesterol is considered the 'bad cholesterol' because it can contribute to plaque build-up in the artery walls - which can narrow the arteries, and set the stage for heart attacks and strokes"    I,Mathew Stumpf,acting as a scribe for [employer], MD.,have documented all relevant documentation on the behalf of Buford Dresser, MD,as directed by  Buford Dresser, MD while in the  presence of Buford Dresser, MD.   I, Buford Dresser, MD, have reviewed all documentation for this  visit. The documentation on 10/14/21 for the exam, diagnosis, procedures, and orders are all accurate and complete.   Signed, Buford Dresser, MD PhD 10/14/2021 4:40 PM    [facility]

## 2021-10-14 NOTE — Patient Instructions (Addendum)
Medication Instructions:  START: Aspirin 81 mg daily               Rosuvastatin 5 mg daily *If you need a refill on your cardiac medications before your next appointment, please call your pharmacy*   Lab Work: Your provider has recommended lab work in August, 2023 (Lipid, LFT). Please have this collected at [facility] at [place]. The lab is open 8:00 am - 4:30 pm. Please avoid 12:00p - 1:00p for lunch hour. You do not need an appointment. Please go to [address] [place], [place] [address]. This is in the Primary Care office on the 3rd floor, let them know you are there for blood work and they will direct you to the lab.  If you have labs (blood work) drawn today and your tests are completely normal, you will receive your results only by: [place] (if you have MyChart) OR A paper copy in the mail If you have any lab test that is abnormal or we need to change your treatment, we will call you to review the results.   Testing/Procedures: None ordered today   Follow-Up: At [facility], you and your health needs are our priority.  As part of our continuing mission to provide you with exceptional heart care, we have created designated Provider Care Teams.  These Care Teams include your primary Cardiologist (physician) and Advanced Practice Providers (APPs -  Physician Assistants and Nurse Practitioners) who all work together to provide you with the care you need, when you need it.  We recommend signing up for the patient portal called "MyChart".  Sign up information is provided on this After Visit Summary.  MyChart is used to connect with patients for Virtual Visits (Telemedicine).  Patients are able to view lab/test results, encounter notes, upcoming appointments, etc.  Non-urgent messages can be sent to your provider as well.   To learn more about what you can do with MyChart, go to [email or web address].    Your next appointment:   Based on test  results  The format for your next appointment:   In Person  Provider:   Buford Dresser, MD   Recent data, summarized from [facility] from a study from the [facility]:  https://wilkins.info/  "Researchers at the [facility] set out to answer this question by comparing statins to supplements in a clinical trial. They tracked the outcomes of 190 adults, ages 78 to 81. Some participants were given a 5 mg daily dose of rosuvastatin, a statin that is sold under the brand name Crestor for 28 days. Others were given supplements, including fish oil, cinnamon, garlic, turmeric, plant sterols or red yeast rice for the same period."  "What we found was that rosuvastatin lowered LDL cholesterol by almost 38% and that was vastly superior to placebo and any of the six supplements studied in the trial," study Despina Hidden, M.D. of the [facility], [place] told NPR. He says this level of reduction is enough to lower the risk of heart attacks and strokes. The findings are published in the Journal of the [employer] of Cardiology.  "Oftentimes these supplements are marketed as 'natural ways' to lower your cholesterol," says Laffin. But he says none of the dietary supplements demonstrated any significant decrease in LDL cholesterol compared with a placebo. LDL cholesterol is considered the 'bad cholesterol' because it can contribute to plaque build-up in the artery walls - which can narrow the arteries, and set the  stage for heart attacks and strokes"

## 2021-10-20 ENCOUNTER — Ambulatory Visit (INDEPENDENT_AMBULATORY_CARE_PROVIDER_SITE_OTHER): Payer: 59

## 2021-10-20 DIAGNOSIS — R7989 Other specified abnormal findings of blood chemistry: Secondary | ICD-10-CM | POA: Diagnosis not present

## 2021-10-20 MED ORDER — TESTOSTERONE CYPIONATE 200 MG/ML IM SOLN
200.0000 mg | INTRAMUSCULAR | Status: DC
  Administered 2021-10-20 – 2021-11-18 (×2): 200 mg via INTRAMUSCULAR

## 2021-10-20 NOTE — Progress Notes (Signed)
Per orders of Dr. Jerilee Hoh, injection of Testosterone 200 mg  given by Talynn Lebon L Amilcar Reever. Patient tolerated injection well.  Patient was given 200 mg due to him not being able to pick up 400 mg at his pharmacy due to picking up medication too early. Patient agreed to take 200 mg until 400 is available for pickup at his pharmacy

## 2021-10-31 ENCOUNTER — Encounter: Payer: Self-pay | Admitting: Family Medicine

## 2021-11-03 ENCOUNTER — Other Ambulatory Visit: Payer: Self-pay | Admitting: Family

## 2021-11-03 DIAGNOSIS — G8929 Other chronic pain: Secondary | ICD-10-CM

## 2021-11-03 MED ORDER — HYDROCODONE-ACETAMINOPHEN 10-325 MG PO TABS: 1.0000 | ORAL_TABLET | Freq: Two times a day (BID) | ORAL | 0 refills | Status: DC | PRN

## 2021-11-17 ENCOUNTER — Ambulatory Visit: Payer: 59

## 2021-11-18 ENCOUNTER — Ambulatory Visit (INDEPENDENT_AMBULATORY_CARE_PROVIDER_SITE_OTHER): Payer: 59

## 2021-11-18 DIAGNOSIS — R7989 Other specified abnormal findings of blood chemistry: Secondary | ICD-10-CM | POA: Diagnosis not present

## 2021-11-18 NOTE — Progress Notes (Signed)
.  testosterone injection to left upper otter qu patient tolerated well

## 2021-12-07 ENCOUNTER — Ambulatory Visit (INDEPENDENT_AMBULATORY_CARE_PROVIDER_SITE_OTHER): Payer: 59

## 2021-12-07 DIAGNOSIS — R7989 Other specified abnormal findings of blood chemistry: Secondary | ICD-10-CM | POA: Diagnosis not present

## 2021-12-07 MED ORDER — TESTOSTERONE CYPIONATE 200 MG/ML IM SOLN
200.0000 mg | INTRAMUSCULAR | Status: DC
  Administered 2021-12-07: 200 mg via INTRAMUSCULAR

## 2021-12-07 NOTE — Progress Notes (Signed)
Per orders of Dr. Hershal Coria, injection of Testosterone Cyp '200mg'$ /ml  given by Encarnacion Slates. Patient tolerated injection well.

## 2021-12-19 ENCOUNTER — Encounter: Payer: Self-pay | Admitting: Family Medicine

## 2021-12-19 ENCOUNTER — Ambulatory Visit: Payer: 59 | Admitting: Family Medicine

## 2021-12-19 VITALS — BP 100/62 | HR 66 | Temp 98.7°F | Ht 71.0 in | Wt 225.5 lb

## 2021-12-19 DIAGNOSIS — R7989 Other specified abnormal findings of blood chemistry: Secondary | ICD-10-CM

## 2021-12-19 DIAGNOSIS — M5416 Radiculopathy, lumbar region: Secondary | ICD-10-CM | POA: Diagnosis not present

## 2021-12-19 DIAGNOSIS — E119 Type 2 diabetes mellitus without complications: Secondary | ICD-10-CM

## 2021-12-19 LAB — POCT GLYCOSYLATED HEMOGLOBIN (HGB A1C): Hemoglobin A1C: 6 % — AB (ref 4.0–5.6)

## 2021-12-19 MED ORDER — HYDROCODONE-ACETAMINOPHEN 7.5-325 MG PO TABS: 1.0000 | ORAL_TABLET | Freq: Two times a day (BID) | ORAL | Status: DC | PRN

## 2021-12-19 MED ORDER — HYDROCODONE-ACETAMINOPHEN 7.5-325 MG PO TABS: 1.0000 | ORAL_TABLET | Freq: Two times a day (BID) | ORAL | 0 refills | Status: DC | PRN

## 2021-12-19 NOTE — Assessment & Plan Note (Signed)
I have reviewed the patient's MRI from 2021 and he has had significant improvement in his pain and sciatica symptoms since having injections and RFA procedure. We had a long discussion about long term use of chronic narcotics and complications including dependency, increasing tolerance to the medication, etc. Patient is doing very well, only taking them at night and occasionally during the day for pain level of 7 or greater, 60 tablets usually lasting him longer than a month. Also takes aleve (4 tablets) OTC as needed as well which does work for him. I advised that we could try reducing the dose of the norco to 7.5 mg/ 325 mg and continue the current frequency, pt is agreeable to trying this. RTC every 3 months for medication refills.

## 2021-12-19 NOTE — Progress Notes (Signed)
Established Patient Office Visit  Subjective   Patient ID: Walter Jordan., male    DOB: [birth year]  Age: 56 y.o. MRN: [id number]  Chief Complaint  Patient presents with   Establish Care   Medication Refill    Patient requests a refill on Hydrocodone    Patient reports his sciatica has resolved since he has received injections and underwent radiofrequency ablations most recently. His MRI results were reviewed and his progress notes and procedures note from his back physician. Patient has had significant improvement in his symptoms since his treatment. Patient is also taking aleve which he takes most of the time, only taking the norco for severe pain. Pt reports no improvement with ibuprofen or tylenol. He also has tried tramadol in the past without improvement. We discussed that since he has experienced improvement in his pain symptoms with the injections and RFA procedure we can try reducing his dose of norco to 7.5 mg instead of 10 mg and he will continue to use the aleve. Pt is agreeable to try a lower dose.  # DIABETES Type II,   Current symptoms/problems include none and have been improving.  Known diabetic complications: impotence Cardiovascular risk factors: advanced age (older than 52 for men, 37 for women), diabetes mellitus, and obesity (BMI >= 30 kg/m2) Current diabetic medications include oral agent (monotherapy): jardiance 10 mg recently started at the last visit.  Eye exam current (within one year): yes Foot exam current (within one year): yes Weight trend: decreasing steadily Prior visit with dietician: no Current diet: not asked Current exercise: not asked  Current monitoring regimen: none Any episodes of hypoglycemia? no  Is He on ACE inhibitor or angiotensin II receptor blocker?  losartan (Cozaar)  The following portions of the patient's history were reviewed and updated as appropriate: current medications, past social history, past surgical history, and problem  list.    Medication Refill This is a chronic problem. The current episode started more than 1 year ago. The problem occurs daily. The problem has been gradually improving. He has tried NSAIDs, oral narcotics and acetaminophen for the symptoms. The treatment provided moderate relief.     Review of Systems  All other systems reviewed and are negative.  Current Outpatient Medications  Medication Instructions   alfuzosin (UROXATRAL) 10 mg, Oral, Daily   aspirin EC 81 mg, Oral, Daily, Swallow whole.   cyclobenzaprine (FLEXERIL) 10 MG tablet TAKE 1 TABLET BY MOUTH THREE TIMES A DAY AS NEEDED FOR MUSCLE SPASMS   empagliflozin (JARDIANCE) 10 mg, Oral, Daily before breakfast   HYDROcodone-acetaminophen (NORCO) 7.5-325 MG tablet 1 tablet, Oral, 2 times daily PRN   levothyroxine (SYNTHROID) 50 MCG tablet TAKE 1 TABLET BY MOUTH EVERY DAY BEFORE BREAKFAST   losartan (COZAAR) 25 mg, Oral, Daily   Multiple Vitamins-Minerals (MEGA MULTIVITAMIN FOR MEN PO) Oral, Daily,     rosuvastatin (CRESTOR) 5 mg, Oral, Daily   SYRINGE-NEEDLE, DISP, 3 ML (B-D 3CC LUER-LOK SYR 23GX1") 23G X 1" 3 ML MISC Use as directed   testosterone cypionate (DEPOTESTOSTERONE CYPIONATE) 400 mg, Intramuscular, Every 21 days      Objective:     BP 100/62 (BP Location: Left Arm, Patient Position: Sitting, Cuff Size: Large)   Pulse 66   Temp 98.7 F (37.1 C) (Oral)   Ht '5\' 11"'$  (1.803 m)   Wt 225 lb 8 oz (102.3 kg)   SpO2 99%   BMI 31.45 kg/m  BP Readings from Last 3 Encounters:  12/19/21 100/62  10/14/21 122/84  10/10/21 110/78      Physical Exam Vitals reviewed.  Constitutional:      Appearance: Normal appearance. He is well-groomed and normal weight.  HENT:     Head: Normocephalic and atraumatic.  Eyes:     Extraocular Movements: Extraocular movements intact.     Conjunctiva/sclera: Conjunctivae normal.     Pupils: Pupils are equal, round, and reactive to light.  Cardiovascular:     Rate and Rhythm: Normal  rate and regular rhythm.     Heart sounds: S1 normal and S2 normal. No murmur heard. Pulmonary:     Effort: Pulmonary effort is normal.     Breath sounds: Normal breath sounds and air entry. No rales.  Abdominal:     General: Bowel sounds are normal.     Tenderness: There is no abdominal tenderness.  Musculoskeletal:     Right lower leg: No edema.     Left lower leg: No edema.  Neurological:     General: No focal deficit present.     Mental Status: He is alert and oriented to person, place, and time.     Gait: Gait is intact.     Deep Tendon Reflexes: Reflexes are normal and symmetric.  Psychiatric:        Mood and Affect: Mood and affect normal.        Last CBC Lab Results  Component Value Date   WBC 10.1 09/12/2021   HGB 17.2 (H) 09/12/2021   HCT 51.9 09/12/2021   MCV 96.2 09/12/2021   MCH 31.9 02/06/2020   RDW 14.7 09/12/2021   PLT 315.0 09/12/2021   Last hemoglobin A1c Lab Results  Component Value Date   HGBA1C 6.0 (A) 12/19/2021      The 10-year ASCVD risk score (Arnett DK, et al., 2019) is: 12.5%    Assessment & Plan:   Problem List Items Addressed This Visit       Endocrine   Controlled type 2 diabetes mellitus without complication, without long-term current use of insulin ([place]) - Primary    Doing very well on the jardiance 10 mg daily, A1C has reduced to 6.0 and he has lost 5 pounds since his last visit. No side effects reported, will continue this medication for him. A1C was performed in office and reviewed with the patient. His 10 year CVD risk is 12.5%, at the level and with his DM it is recommended that he be placed on statin therapy for risk reduction, will discuss this with the patient at the next visit.       Relevant Orders   POC HgB A1c (Completed)     Nervous and Auditory   Lumbar radiculopathy    I have reviewed the patient's MRI from 2021 and he has had significant improvement in his pain and sciatica symptoms since having injections and  RFA procedure. We had a long discussion about long term use of chronic narcotics and complications including dependency, increasing tolerance to the medication, etc. Patient is doing very well, only taking them at night and occasionally during the day for pain level of 7 or greater, 60 tablets usually lasting him longer than a month. Also takes aleve (4 tablets) OTC as needed as well which does work for him. I advised that we could try reducing the dose of the norco to 7.5 mg/ 325 mg and continue the current frequency, pt is agreeable to trying this. RTC every 3 months for medication refills.      Relevant  Medications   HYDROcodone-acetaminophen (NORCO) 7.5-325 MG tablet     Other   Low testosterone in male    Patient has not yet increased his dose to 400 mg, states his next injection will start the 400 mg dose. Will order repeat testosterone and CBC in 3 months at his next visit. Low T is often exacerbated by the use of chronic opioid therapy so reducing his dose of norco may help improve his testosterone levels as well....       Return in about 3 months (around 03/21/2022) for controlled medication refills.    Farrel Conners, MD

## 2021-12-19 NOTE — Assessment & Plan Note (Signed)
Patient has not yet increased his dose to 400 mg, states his next injection will start the 400 mg dose. Will order repeat testosterone and CBC in 3 months at his next visit. Low T is often exacerbated by the use of chronic opioid therapy so reducing his dose of norco may help improve his testosterone levels as well.Marland KitchenMarland KitchenMarland Kitchen

## 2021-12-19 NOTE — Assessment & Plan Note (Addendum)
Doing very well on the jardiance 10 mg daily, A1C has reduced to 6.0 and he has lost 5 pounds since his last visit. No side effects reported, will continue this medication for him. A1C was performed in office and reviewed with the patient. His 10 year CVD risk is 12.5%, at the level and with his DM it is recommended that he be placed on statin therapy for risk reduction, will discuss this with the patient at the next visit.

## 2021-12-28 ENCOUNTER — Ambulatory Visit (INDEPENDENT_AMBULATORY_CARE_PROVIDER_SITE_OTHER): Payer: 59 | Admitting: *Deleted

## 2021-12-28 DIAGNOSIS — E291 Testicular hypofunction: Secondary | ICD-10-CM

## 2021-12-28 DIAGNOSIS — R7989 Other specified abnormal findings of blood chemistry: Secondary | ICD-10-CM

## 2021-12-28 MED ORDER — TESTOSTERONE CYPIONATE 200 MG/ML IM SOLN
400.0000 mg | Freq: Once | INTRAMUSCULAR | Status: AC
  Administered 2021-12-28: 400 mg via INTRAMUSCULAR

## 2021-12-28 NOTE — Progress Notes (Signed)
Per orders of Dr. Michael, injection of Testosterone cypionate 400mg given by Braxdon Gappa A. Patient tolerated injection well.  

## 2022-01-06 ENCOUNTER — Ambulatory Visit: Payer: 59 | Admitting: Physician Assistant

## 2022-01-06 ENCOUNTER — Encounter: Payer: Self-pay | Admitting: Physician Assistant

## 2022-01-06 ENCOUNTER — Ambulatory Visit: Payer: Self-pay

## 2022-01-06 DIAGNOSIS — M542 Cervicalgia: Secondary | ICD-10-CM

## 2022-01-06 MED ORDER — DIAZEPAM 5 MG PO TABS: 5.0000 mg | ORAL_TABLET | Freq: Two times a day (BID) | ORAL | 0 refills | Status: AC | PRN

## 2022-01-06 MED ORDER — METHYLPREDNISOLONE 4 MG PO TBPK: ORAL_TABLET | ORAL | 0 refills | Status: DC

## 2022-01-06 NOTE — Progress Notes (Signed)
Office Visit Note   Patient: Walter Jordan.           Date of Birth: 1965-12-06           MRN: [id number] Visit Date: 01/06/2022              Requested by: Farrel Conners, [address] [place],  [place] [address] PCP: Farrel Conners, MD  Chief Complaint  Patient presents with   Neck - Pain      HPI: Patient is a pleasant 56 year old gentleman who is a patient of Dr. Ernestina Patches and Dr. Erlinda Hong.  He presents today with a 2-day history of neck pain going down into his right side.  He denies any history but he states he woke up with the pain after moving around in his sleep.  It is all on the right side.  He denies any paresthesias he denies any numbness or loss of strength he has tried taking Flexeril which she takes chronically as well as hydrocodone which she takes chronically without much improvement.  He is a non-insulin-dependent diabetic and his last A1c was 6.  Denies any headache  Assessment & Plan: Visit Diagnoses:  1. Neck pain     Plan: X-rays show straightening of the lordotic curve as well as degenerative changes in his cervical spine.  Patient has taken a Medrol Dosepak in the past and done well.  I would like him to stop the anti-inflammatories he is on it and begin a Medrol Dosepak.  We will switch out his Flexeril with temporarily with some Valium he understands not to take other narcotics or other muscle relaxants with this.  We will also give him a soft neck collar.  He understands to follow-up if symptoms do not improve or get worse  Follow-Up Instructions: As needed  Ortho Exam  Patient is alert, oriented, no adenopathy, well-dressed, normal affect, normal respiratory effort. Examination he is holding his head very straight resistant to bending or extending.  He can turn to the left it is extremely painful when he tries to turn to the right.  Distal sensation on the right side is intact he is able to elevate his arm without difficulty.  He has quite a bit  of spasm that extends from the neck down into his trapezius on the right.  Imaging: No results found. No images are attached to the encounter.  Labs: Lab Results  Component Value Date   HGBA1C 6.0 (A) 12/19/2021   HGBA1C 6.6 (H) 09/12/2021   HGBA1C 6.0 01/07/2021   ESRSEDRATE 2 02/08/2018     Lab Results  Component Value Date   ALBUMIN 5.0 09/12/2021   ALBUMIN 4.3 01/28/2021   ALBUMIN 4.5 01/07/2021    No results found for: "MG" Lab Results  Component Value Date   VD25OH 26.46 (L) 04/28/2019    No results found for: "PREALBUMIN"    Latest Ref Rng & Units 09/12/2021   12:59 PM 01/07/2021    8:04 AM 02/06/2020    9:20 AM  CBC EXTENDED  WBC 4.0 - 10.5 K/uL 10.1  4.2  4.0   RBC 4.22 - 5.81 Mil/uL 5.39  5.11  4.83   Hemoglobin 13.0 - 17.0 g/dL 17.2  16.4  15.4   HCT 39.0 - 52.0 % 51.9  48.7  46.1   Platelets 150.0 - 400.0 K/uL 315.0  159.0  251   NEUT# 1.4 - 7.7 K/uL 6.7  2.3  1,396   Lymph# 0.7 -  4.0 K/uL 2.1  1.2  1,988      There is no height or weight on file to calculate BMI.  Orders:  Orders Placed This Encounter  Procedures   XR Cervical Spine 2 or 3 views   Meds ordered this encounter  Medications   methylPREDNISolone (MEDROL DOSEPAK) 4 MG TBPK tablet    Sig: Take as directed    Dispense:  21 tablet    Refill:  0   diazepam (VALIUM) 5 MG tablet    Sig: Take 1 tablet (5 mg total) by mouth every 12 (twelve) hours as needed for anxiety.    Dispense:  20 tablet    Refill:  0     Procedures: No procedures performed  Clinical Data: No additional findings.  ROS:  All other systems negative, except as noted in the HPI. Review of Systems  Objective: Vital Signs: There were no vitals taken for this visit.  Specialty Comments:  MRI LUMBAR SPINE WITHOUT CONTRAST     TECHNIQUE:  Multiplanar, multisequence MR imaging of the lumbar spine was  performed. No intravenous contrast was administered.     COMPARISON:  Previous MRI from 07/28/2014.      FINDINGS:  Segmentation: Standard. Lowest well-formed disc space labeled the  L5-S1 level.     Alignment: 4 mm retrolisthesis of L5 on S1, with trace  retrolisthesis of L2 on L3. Trace anterolisthesis of L3 on L4.  Findings chronic and facet mediated. Straightening of the normal  lumbar lordosis with underlying mild dextroscoliosis.     Vertebrae: Vertebral body height maintained without evidence acute  or chronic fracture. Bone marrow signal intensity somewhat  heterogeneous but within normal limits. No discrete or worrisome  osseous lesions. Discogenic reactive endplate changes seen about the  left aspect of the L4-5 interspace and right aspect of the L5-S1  interspace. No other abnormal marrow edema.     Conus medullaris and cauda equina: Conus extends to the L1 level.  Conus and cauda equina appear normal.     Paraspinal and other soft tissues: Paraspinous soft tissues  demonstrate no acute finding. T2 hyperintense simple cyst partially  visualize within the interpolar left kidney. Visualized visceral  structures otherwise unremarkable.     Disc levels:     L1-2: Negative interspace. Mild facet hypertrophy. No significant  canal or foraminal stenosis.     L2-3: Chronic intervertebral disc space narrowing with diffuse disc  bulge. Superimposed mild reactive endplate changes. Moderate facet  hypertrophy. Prominence of the dorsal epidural fat. Changes  resultant moderate canal with bilateral lateral recess stenosis,  greater on the left. Mild bilateral L2 foraminal narrowing.     L3-4: Chronic intervertebral disc space narrowing with mild diffuse  disc bulge. There is a superimposed broad right extraforaminal disc  protrusion, closely approximating the exiting right L3 nerve root  (series 13, image 23). Moderate bilateral facet hypertrophy.  Resultant mild spinal stenosis. Mild to moderate bilateral L3  foraminal narrowing, slightly worse on the right.     L4-5: Chronic  intervertebral disc space narrowing with diffuse disc  bulge and disc desiccation. Disc bulging asymmetric to the left with  exuberant left-sided endplate osteophytic spurring. Superimposed  moderate left worse than right facet hypertrophy. Trace joint  effusion on the right. Resultant severe left foraminal stenosis with  probable impingement of the left L4 nerve root. Moderate to severe  left lateral recess stenosis. Central canal remains patent. Mild  right foraminal narrowing noted.     L5-S1:  Advanced chronic intervertebral disc space narrowing with  diffuse disc bulge and disc desiccation. Extensive reactive endplate  changes with marginal endplate osteophytic spurring. Mild bilateral  facet hypertrophy with associated trace joint effusions. Broad-based  posterior disc osteophyte contacts the descending S1 nerve roots  bilaterally without frank neural impingement or displacement.  Moderate to severe bilateral L5 foraminal stenosis.     IMPRESSION:  1. Left eccentric disc osteophyte and facet hypertrophy at L4-5 with  resultant severe left foraminal stenosis, with moderate to severe  left lateral recess narrowing. Either the left L4 or descending L5  nerve roots could be affected.  2. Chronic degenerative disc osteophyte at L5-S1, contacting the  descending S1 nerve roots bilaterally without frank neural  impingement or displacement. Moderate to severe bilateral L5  foraminal stenosis at this level.  3. Right extraforaminal disc protrusion at L3-4, potentially  affecting the exiting right L3 nerve root.  4. Disc bulge with facet hypertrophy at L2-3 with resultant moderate  spinal stenosis.  5. Overall, these changes are progressed relative to 2016.        Electronically Signed    By: Jeannine Boga M.D.    On: 08/17/2019 08:22  PMFS History: Patient Active Problem List   Diagnosis Date Noted   Nonallopathic lesion of lumbar region 07/28/2020   Nonallopathic lesion  of sacral region 07/28/2020   Nonallopathic lesion of thoracic region 07/28/2020   Impaired glucose tolerance 12/04/2019   Low testosterone in male 06/11/2019   Controlled type 2 diabetes mellitus without complication, without long-term current use of insulin ([place]) 06/11/2019   Pain in left hand 03/28/2019   Hx of adenomatous colonic polyps 10/25/2017   Lumbar radiculopathy 05/11/2016   VERTIGO 02/22/2010   Obstructive sleep apnea 09/27/2009   ERECTILE DYSFUNCTION, ORGANIC 09/07/2009   CHEST PAIN 03/13/2008   OTHER SYMPTOMS INVOLVING DIGESTIVE SYSTEM OTHER 03/12/2008   RECTAL BLEEDING, HX OF 03/12/2008   Hypothyroidism 02/22/2007   Allergic rhinitis 02/22/2007   GERD 02/22/2007   LOW BACK PAIN 02/22/2007   Past Medical History:  Diagnosis Date   Allergic rhinitis    Allergy    Arthritis    back   ED (erectile dysfunction)    Elevated lipase    Enlarged prostate    GERD (gastroesophageal reflux disease)    Hepatic steatosis    Hypothyroidism    Low back pain    Low testosterone    Sleep apnea    had sleep study 09-10-16- needs new cpap    Family History  Problem Relation Age of Onset   Cancer Father        sarcoma- both kidneys removed   Diabetes Father        s/p renal transplant   Arthritis Mother    High blood pressure Mother    High Cholesterol Mother    Heart disease Mother    Diabetes Brother    Diabetes Maternal Grandmother    Diabetes Paternal Grandmother    Cancer Paternal Grandmother        colon   Depression Other    Diabetes Other    Hypertension Other    Kidney disease Other    Obesity Sister    Stroke Maternal Grandfather 60       tobacco abuse   Alcohol abuse Maternal Grandfather    Early death Paternal Grandfather        accidental   Prostate cancer Maternal Uncle    Heart attack Maternal Uncle 41  Prostate cancer Maternal Uncle    Cancer Paternal Uncle        uncertain type   Alcohol abuse Paternal Uncle    Colon polyps Neg Hx     Rectal cancer Neg Hx    Stomach cancer Neg Hx    Esophageal cancer Neg Hx    Pancreatic cancer Neg Hx     Past Surgical History:  Procedure Laterality Date   COLONOSCOPY  03/19/2008; 2018   ext. hems    HERNIA REPAIR     x2 1968, inguinal [place] EXTRACTION     Social History   Occupational History   Not on file  Tobacco Use   Smoking status: Never   Smokeless tobacco: Never  Vaping Use   Vaping Use: Never used  Substance and Sexual Activity   Alcohol use: Yes    Comment: social   Drug use: No   Sexual activity: Not on file

## 2022-01-13 ENCOUNTER — Ambulatory Visit: Payer: 59 | Admitting: Physician Assistant

## 2022-01-16 ENCOUNTER — Telehealth: Payer: Self-pay | Admitting: Gastroenterology

## 2022-01-16 NOTE — Telephone Encounter (Signed)
Patient called to schedule colonoscopy, recall date states he isn't due until 2025. Pt states he is due every 5 years and after further review his last was 2018, so he should be due this year? Please advise, thank you.

## 2022-01-16 NOTE — Telephone Encounter (Signed)
Called and spoke with patient regarding Dr. Loletha Carrow' recommendations. Pt is aware that he will receive a reminder call closer to the time of his recall. Pt verbalized understanding and had no concerns at the end of the call.

## 2022-01-16 NOTE — Telephone Encounter (Signed)
The original recall recommendation was 5 years, based on the polyp surveillance guidelines in 2018.  Guidelines were updated since then and recommend a 7 year recall for him, so May 2025.  - HD

## 2022-01-20 ENCOUNTER — Ambulatory Visit (INDEPENDENT_AMBULATORY_CARE_PROVIDER_SITE_OTHER): Payer: 59 | Admitting: *Deleted

## 2022-01-20 DIAGNOSIS — R7989 Other specified abnormal findings of blood chemistry: Secondary | ICD-10-CM

## 2022-01-20 DIAGNOSIS — E785 Hyperlipidemia, unspecified: Secondary | ICD-10-CM | POA: Diagnosis not present

## 2022-01-20 DIAGNOSIS — E119 Type 2 diabetes mellitus without complications: Secondary | ICD-10-CM | POA: Diagnosis not present

## 2022-01-20 LAB — CBC WITH DIFFERENTIAL/PLATELET
Basophils Absolute: 0.1 10*3/uL (ref 0.0–0.1)
Basophils Relative: 2.9 % (ref 0.0–3.0)
Eosinophils Absolute: 0.1 10*3/uL (ref 0.0–0.7)
Eosinophils Relative: 3 % (ref 0.0–5.0)
HCT: 47.3 % (ref 39.0–52.0)
Hemoglobin: 15.9 g/dL (ref 13.0–17.0)
Lymphocytes Relative: 42.1 % (ref 12.0–46.0)
Lymphs Abs: 1.5 10*3/uL (ref 0.7–4.0)
MCHC: 33.6 g/dL (ref 30.0–36.0)
MCV: 95.2 fl (ref 78.0–100.0)
Monocytes Absolute: 0.5 10*3/uL (ref 0.1–1.0)
Monocytes Relative: 14.3 % — ABNORMAL HIGH (ref 3.0–12.0)
Neutro Abs: 1.3 10*3/uL — ABNORMAL LOW (ref 1.4–7.7)
Neutrophils Relative %: 37.7 % — ABNORMAL LOW (ref 43.0–77.0)
Platelets: 181 10*3/uL (ref 150.0–400.0)
RBC: 4.97 Mil/uL (ref 4.22–5.81)
RDW: 14.3 % (ref 11.5–15.5)
WBC: 3.5 10*3/uL — ABNORMAL LOW (ref 4.0–10.5)

## 2022-01-20 LAB — PSA: PSA: 2.51 ng/mL (ref 0.10–4.00)

## 2022-01-20 LAB — COMPREHENSIVE METABOLIC PANEL
ALT: 33 U/L (ref 0–53)
AST: 49 U/L — ABNORMAL HIGH (ref 0–37)
Albumin: 4.3 g/dL (ref 3.5–5.2)
Alkaline Phosphatase: 54 U/L (ref 39–117)
BUN: 18 mg/dL (ref 6–23)
CO2: 33 mEq/L — ABNORMAL HIGH (ref 19–32)
Calcium: 9.2 mg/dL (ref 8.4–10.5)
Chloride: 100 mEq/L (ref 96–112)
Creatinine, Ser: 1.23 mg/dL (ref 0.40–1.50)
GFR: 65.64 mL/min (ref >60.00)
Glucose, Bld: 111 mg/dL — ABNORMAL HIGH (ref 70–99)
Potassium: 4.5 mEq/L (ref 3.5–5.1)
Sodium: 138 mEq/L (ref 135–145)
Total Bilirubin: 1.2 mg/dL (ref 0.2–1.2)
Total Protein: 6.9 g/dL (ref 6.0–8.3)

## 2022-01-20 LAB — LIPID PANEL
Cholesterol: 121 mg/dL (ref 0–200)
HDL: 47.8 mg/dL (ref >39.00)
LDL Cholesterol: 62 mg/dL (ref 0–99)
NonHDL: 72.77
Total CHOL/HDL Ratio: 3
Triglycerides: 55 mg/dL (ref 0.0–149.0)
VLDL: 11 mg/dL (ref 0.0–40.0)

## 2022-01-20 LAB — HEMOGLOBIN A1C: Hgb A1c MFr Bld: 5.9 % (ref 4.6–6.5)

## 2022-01-20 LAB — TESTOSTERONE: Testosterone: 165.15 ng/dL — ABNORMAL LOW (ref 300.00–890.00)

## 2022-01-20 MED ORDER — TESTOSTERONE CYPIONATE 200 MG/ML IM SOLN
400.0000 mg | Freq: Once | INTRAMUSCULAR | Status: AC
  Administered 2022-01-20: 400 mg via INTRAMUSCULAR

## 2022-01-20 NOTE — Progress Notes (Signed)
Per orders of Dr. Michael, injection of Testosterone cypionate 400mg given by Harper Smoker A. Patient tolerated injection well.  

## 2022-02-10 ENCOUNTER — Ambulatory Visit: Payer: 59

## 2022-02-10 ENCOUNTER — Ambulatory Visit (INDEPENDENT_AMBULATORY_CARE_PROVIDER_SITE_OTHER): Payer: 59

## 2022-02-10 DIAGNOSIS — R7989 Other specified abnormal findings of blood chemistry: Secondary | ICD-10-CM | POA: Diagnosis not present

## 2022-02-10 MED ORDER — TESTOSTERONE CYPIONATE 200 MG/ML IM SOLN
400.0000 mg | INTRAMUSCULAR | Status: DC
  Administered 2022-02-10: 400 mg via INTRAMUSCULAR

## 2022-02-10 NOTE — Progress Notes (Signed)
Walter Jordan. is a 56 y.o. male presents to the office today for Testosteron injections, per Dr. Legrand Como. Original order: Testosterone injection '400mg'$   Q3 weeks. Testosterone '400mg'$  IM was administered left ventrogluteal today. Patient tolerated injection. Patient due for follow up labs/provider appt: No. Patient next injection due: 03/03/22, appt made No. Pt declines to schedule appt at this time. 60monthf/u OV scheduled for 03/21/22

## 2022-03-08 ENCOUNTER — Ambulatory Visit (INDEPENDENT_AMBULATORY_CARE_PROVIDER_SITE_OTHER): Payer: 59 | Admitting: *Deleted

## 2022-03-08 DIAGNOSIS — R7989 Other specified abnormal findings of blood chemistry: Secondary | ICD-10-CM

## 2022-03-08 MED ORDER — TESTOSTERONE CYPIONATE 200 MG/ML IM SOLN
400.0000 mg | Freq: Once | INTRAMUSCULAR | Status: AC
  Administered 2022-03-08: 400 mg via INTRAMUSCULAR

## 2022-03-08 NOTE — Progress Notes (Signed)
Per orders of Dr. Elease Hashimoto, injection of Testosterone cypionate '400mg'$  given by Agnes Lawrence. Patient tolerated injection well.

## 2022-03-10 ENCOUNTER — Other Ambulatory Visit: Payer: Self-pay | Admitting: *Deleted

## 2022-03-10 DIAGNOSIS — I10 Essential (primary) hypertension: Secondary | ICD-10-CM

## 2022-03-10 MED ORDER — LOSARTAN POTASSIUM 25 MG PO TABS: 25.0000 mg | ORAL_TABLET | Freq: Every day | ORAL | 1 refills | Status: DC

## 2022-03-21 ENCOUNTER — Encounter: Payer: Self-pay | Admitting: Family Medicine

## 2022-03-21 ENCOUNTER — Ambulatory Visit: Payer: 59 | Admitting: Family Medicine

## 2022-03-21 ENCOUNTER — Encounter: Payer: Self-pay | Admitting: Gastroenterology

## 2022-03-21 DIAGNOSIS — E039 Hypothyroidism, unspecified: Secondary | ICD-10-CM

## 2022-03-21 DIAGNOSIS — E119 Type 2 diabetes mellitus without complications: Secondary | ICD-10-CM

## 2022-03-21 DIAGNOSIS — M5416 Radiculopathy, lumbar region: Secondary | ICD-10-CM

## 2022-03-21 MED ORDER — HYDROCODONE-ACETAMINOPHEN 7.5-325 MG PO TABS: 1.0000 | ORAL_TABLET | Freq: Two times a day (BID) | ORAL | 0 refills | Status: DC | PRN

## 2022-03-21 MED ORDER — EMPAGLIFLOZIN 10 MG PO TABS: 10.0000 mg | ORAL_TABLET | Freq: Every day | ORAL | 3 refills | Status: DC

## 2022-03-21 MED ORDER — LEVOTHYROXINE SODIUM 50 MCG PO TABS: ORAL_TABLET | ORAL | 3 refills | Status: DC

## 2022-03-21 NOTE — Assessment & Plan Note (Signed)
Currently on crestor 5 mg daily, jardiance 10 mg daily was refilled today. A1C done on 01/20/22 was 5.9 which is very well controlled. Will continue the medication as prescribed.

## 2022-03-21 NOTE — Assessment & Plan Note (Signed)
Pt is doing well with PRN dosing, only taking the norco as needed, will refill the 7.5 mg dose and continue to see the patient every 3 months. Next visit may be video visit.

## 2022-03-21 NOTE — Assessment & Plan Note (Signed)
Last TSH level was 2.8. Patient is due for refills today. He reports he is stable on this medication. Will continue 50 mcg daily of levothyroxine as prescribed.

## 2022-03-21 NOTE — Progress Notes (Signed)
Established Patient Office Visit  Subjective   Patient ID: Walter Jordan., male    DOB: 08/09/1965  Age: 56 y.o. MRN: [id number]  Chief Complaint  Patient presents with   Follow-up    Lumbar radiculopathy --Patient reports that he never went to pick up the 7.5 mg norco-- states that the pharmacy never called to tell him the rx was ready. States that he had some 10 mg left over so he took those. States that he is also taking NSAIDS regularly for pain management. Is only taking the 10 mg as needed for severe pain, states that he has gone up to 2 weeks in between taking the medication.   Low T-- pt reports that he gets the injections every 21 days, his testosterone level was 165 on the day that he had his injection on 01/20/2022. He has tried the gel in the past and finds it cumbersome to apply every day. States that he is satisfied with the injections.   Health Maintenance-- we reviewed his HM, states he was told he did not need a colonoscopy for another 2 years. Declines flu shot today.   DM-- needs refills on his jardiance, reviewed last kidney function, he is UTD on the foot and eye exams. States that he is doing well on the medication, denies side effects.    Current Outpatient Medications  Medication Instructions   alfuzosin (UROXATRAL) 10 mg, Oral, Daily   aspirin EC 81 mg, Oral, Daily, Swallow whole.   cyclobenzaprine (FLEXERIL) 10 MG tablet TAKE 1 TABLET BY MOUTH THREE TIMES A DAY AS NEEDED FOR MUSCLE SPASMS   diazepam (VALIUM) 5 mg, Oral, Every 12 hours PRN   empagliflozin (JARDIANCE) 10 mg, Oral, Daily before breakfast   HYDROcodone-acetaminophen (NORCO) 7.5-325 MG tablet 1 tablet, Oral, 2 times daily PRN   levothyroxine (SYNTHROID) 50 MCG tablet TAKE 1 TABLET BY MOUTH EVERY DAY BEFORE BREAKFAST   losartan (COZAAR) 25 mg, Oral, Daily   Multiple Vitamins-Minerals (MEGA MULTIVITAMIN FOR MEN PO) Oral, Daily,     rosuvastatin (CRESTOR) 5 mg, Oral, Daily   SYRINGE-NEEDLE, DISP, 3  ML (B-D 3CC LUER-LOK SYR 23GX1") 23G X 1" 3 ML MISC Use as directed    Patient Active Problem List   Diagnosis Date Noted   Cervicalgia 01/06/2022   Nonallopathic lesion of lumbar region 07/28/2020   Nonallopathic lesion of sacral region 07/28/2020   Nonallopathic lesion of thoracic region 07/28/2020   Impaired glucose tolerance 12/04/2019   Low testosterone in male 06/11/2019   Controlled type 2 diabetes mellitus without complication, without long-term current use of insulin ([place]) 06/11/2019   Pain in left hand 03/28/2019   Hx of adenomatous colonic polyps 10/25/2017   Lumbar radiculopathy 05/11/2016   VERTIGO 02/22/2010   Obstructive sleep apnea 09/27/2009   ERECTILE DYSFUNCTION, ORGANIC 09/07/2009   CHEST PAIN 03/13/2008   OTHER SYMPTOMS INVOLVING DIGESTIVE SYSTEM OTHER 03/12/2008   RECTAL BLEEDING, HX OF 03/12/2008   Hypothyroidism 02/22/2007   Allergic rhinitis 02/22/2007   GERD 02/22/2007   LOW BACK PAIN 02/22/2007      Review of Systems  All other systems reviewed and are negative.     Objective:     BP 108/68 (BP Location: Left Arm, Patient Position: Sitting, Cuff Size: Large)   Pulse 75   Temp 98.8 F (37.1 C) (Oral)   Ht '5\' 11"'$  (1.803 m)   Wt 209 lb 4.8 oz (94.9 kg)   SpO2 98%   BMI 29.19 kg/m  Physical Exam Vitals reviewed.  Constitutional:      Appearance: Normal appearance. He is well-groomed and normal weight.  Eyes:     Conjunctiva/sclera: Conjunctivae normal.  Cardiovascular:     Rate and Rhythm: Normal rate and regular rhythm.     Heart sounds: S1 normal and S2 normal. No murmur heard. Pulmonary:     Effort: Pulmonary effort is normal.     Breath sounds: Normal breath sounds and air entry. No rales.  Abdominal:     General: Bowel sounds are normal.  Musculoskeletal:     Right lower leg: No edema.     Left lower leg: No edema.  Neurological:     General: No focal deficit present.     Mental Status: He is alert and oriented to person,  place, and time.     Gait: Gait is intact.  Psychiatric:        Mood and Affect: Mood and affect normal.      No results found for any visits on 03/21/22.  Last CBC Lab Results  Component Value Date   WBC 3.5 (L) 01/20/2022   HGB 15.9 01/20/2022   HCT 47.3 01/20/2022   MCV 95.2 01/20/2022   MCH 31.9 02/06/2020   RDW 14.3 01/20/2022   PLT 181.0 00/17/4944   Last metabolic panel Lab Results  Component Value Date   GLUCOSE 111 (H) 01/20/2022   NA 138 01/20/2022   K 4.5 01/20/2022   CL 100 01/20/2022   CO2 33 (H) 01/20/2022   BUN 18 01/20/2022   CREATININE 1.23 01/20/2022   CALCIUM 9.2 01/20/2022   PROT 6.9 01/20/2022   ALBUMIN 4.3 01/20/2022   BILITOT 1.2 01/20/2022   ALKPHOS 54 01/20/2022   AST 49 (H) 01/20/2022   ALT 33 01/20/2022   Last lipids Lab Results  Component Value Date   CHOL 121 01/20/2022   HDL 47.80 01/20/2022   LDLCALC 62 01/20/2022   TRIG 55.0 01/20/2022   CHOLHDL 3 01/20/2022   Last hemoglobin A1c Lab Results  Component Value Date   HGBA1C 5.9 01/20/2022   Last thyroid functions Lab Results  Component Value Date   TSH 2.80 09/12/2021      The ASCVD Risk score (Arnett DK, et al., 2019) failed to calculate for the following reasons:   The valid total cholesterol range is 130 to 320 mg/dL    Assessment & Plan:   Problem List Items Addressed This Visit       Endocrine   Hypothyroidism    Last TSH level was 2.8. Patient is due for refills today. He reports he is stable on this medication. Will continue 50 mcg daily of levothyroxine as prescribed.       Relevant Medications   levothyroxine (SYNTHROID) 50 MCG tablet   Controlled type 2 diabetes mellitus without complication, without long-term current use of insulin ([facility])    Currently on crestor 5 mg daily, jardiance 10 mg daily was refilled today. A1C done on 01/20/22 was 5.9 which is very well controlled. Will continue the medication as prescribed.       Relevant Medications    empagliflozin (JARDIANCE) 10 MG TABS tablet     Nervous and Auditory   Lumbar radiculopathy    Pt is doing well with PRN dosing, only taking the norco as needed, will refill the 7.5 mg dose and continue to see the patient every 3 months. Next visit may be video visit.       Relevant Medications  HYDROcodone-acetaminophen (NORCO) 7.5-325 MG tablet    Return in about 3 months (around 06/21/2022) for Medication refills -- ok to schedule video visit. Farrel Conners, MD

## 2022-03-31 ENCOUNTER — Ambulatory Visit (INDEPENDENT_AMBULATORY_CARE_PROVIDER_SITE_OTHER): Payer: 59

## 2022-03-31 DIAGNOSIS — R7989 Other specified abnormal findings of blood chemistry: Secondary | ICD-10-CM | POA: Diagnosis not present

## 2022-03-31 MED ORDER — TESTOSTERONE CYPIONATE 200 MG/ML IM SOLN
400.0000 mg | INTRAMUSCULAR | Status: DC
  Administered 2022-03-31: 400 mg via INTRAMUSCULAR

## 2022-03-31 NOTE — Progress Notes (Signed)
Per orders of Dr. Legrand Como, injection of Testosterone '400mg'$   given by Encarnacion Slates on Left Ventrogluteal.  Patient tolerated injection well.

## 2022-04-27 ENCOUNTER — Other Ambulatory Visit: Payer: Self-pay | Admitting: Family Medicine

## 2022-04-27 ENCOUNTER — Telehealth: Payer: Self-pay | Admitting: *Deleted

## 2022-04-27 DIAGNOSIS — R7989 Other specified abnormal findings of blood chemistry: Secondary | ICD-10-CM

## 2022-04-27 MED ORDER — TESTOSTERONE CYPIONATE 200 MG/ML IM SOLN: 400.0000 mg | INTRAMUSCULAR | 2 refills | Status: DC

## 2022-04-27 NOTE — Telephone Encounter (Signed)
Noted  

## 2022-04-27 NOTE — Telephone Encounter (Signed)
Rx sent 

## 2022-04-27 NOTE — Telephone Encounter (Signed)
[facility] faxed a refill request for Testosterone cypionate '400mg'$  injection.  Message sent to PCP.

## 2022-05-08 ENCOUNTER — Ambulatory Visit (INDEPENDENT_AMBULATORY_CARE_PROVIDER_SITE_OTHER): Payer: 59 | Admitting: *Deleted

## 2022-05-08 DIAGNOSIS — R7989 Other specified abnormal findings of blood chemistry: Secondary | ICD-10-CM | POA: Diagnosis not present

## 2022-05-08 MED ORDER — TESTOSTERONE CYPIONATE 200 MG/ML IM SOLN
400.0000 mg | Freq: Once | INTRAMUSCULAR | Status: AC
  Administered 2022-05-08: 400 mg via INTRAMUSCULAR

## 2022-05-08 NOTE — Progress Notes (Signed)
Per orders of Dr. Legrand Como, injection of Testosterone cypionate '400mg'$  given by Lahoma Crocker A. Patient tolerated injection well.

## 2022-05-11 ENCOUNTER — Other Ambulatory Visit: Payer: Self-pay | Admitting: *Deleted

## 2022-05-11 DIAGNOSIS — E119 Type 2 diabetes mellitus without complications: Secondary | ICD-10-CM

## 2022-05-11 MED ORDER — EMPAGLIFLOZIN 10 MG PO TABS: 10.0000 mg | ORAL_TABLET | Freq: Every day | ORAL | 0 refills | Status: DC

## 2022-05-11 NOTE — Telephone Encounter (Signed)
Rx done. 

## 2022-05-30 ENCOUNTER — Encounter: Payer: Self-pay | Admitting: Orthopaedic Surgery

## 2022-05-30 ENCOUNTER — Ambulatory Visit: Payer: 59 | Admitting: Orthopaedic Surgery

## 2022-05-30 ENCOUNTER — Ambulatory Visit (INDEPENDENT_AMBULATORY_CARE_PROVIDER_SITE_OTHER): Payer: 59

## 2022-05-30 DIAGNOSIS — M545 Low back pain, unspecified: Secondary | ICD-10-CM | POA: Diagnosis not present

## 2022-05-30 DIAGNOSIS — G8929 Other chronic pain: Secondary | ICD-10-CM | POA: Diagnosis not present

## 2022-05-30 MED ORDER — CYCLOBENZAPRINE HCL 5 MG PO TABS: 5.0000 mg | ORAL_TABLET | Freq: Three times a day (TID) | ORAL | 3 refills | Status: DC | PRN

## 2022-05-30 NOTE — Progress Notes (Signed)
Office Visit Note   Patient: Walter Jordan.           Date of Birth: June 08, 1965           MRN: [id number] Visit Date: 05/30/2022              Requested by: Farrel Conners, [address] [place],  [place] [id number] PCP: Farrel Conners, MD   Assessment & Plan: Visit Diagnoses:  1. Chronic right-sided low back pain, unspecified whether sciatica present     Plan: Javar's symptoms seem to be mainly during sexual activity.  With recent growing symptoms and history of chronic back issues we will repeat lumbar spine MRI to look for progression of disease.  Flexeril refilled as Valium makes him too drowsy.  He did request to follow-up with Dr. Laurance Flatten after the MRI so that he can discuss in depth his treatment options going forward and to understand if surgery is necessary.  Follow-Up Instructions: No follow-ups on file.   Orders:  Orders Placed This Encounter  Procedures   XR Lumbar Spine 2-3 Views   Meds ordered this encounter  Medications   cyclobenzaprine (FLEXERIL) 5 MG tablet    Sig: Take 1-2 tablets (5-10 mg total) by mouth 3 (three) times daily as needed for muscle spasms.    Dispense:  30 tablet    Refill:  3      Procedures: No procedures performed   Clinical Data: No additional findings.   Subjective: Chief Complaint  Patient presents with   Lower Back - Pain    HPI [name] returns today for symptoms of popping and spasming and cracking in the groin and back region while having sex mainly.  He has had longstanding chronic back issues.  Denies any red flag symptoms. Review of Systems   Objective: Vital Signs: There were no vitals taken for this visit.  Physical Exam  Ortho Exam Examination of the lumbar spine and lower extremities are nonfocal. Specialty Comments:  MRI LUMBAR SPINE WITHOUT CONTRAST     TECHNIQUE:  Multiplanar, multisequence MR imaging of the lumbar spine was  performed. No intravenous contrast was administered.      COMPARISON:  Previous MRI from 07/28/2014.     FINDINGS:  Segmentation: Standard. Lowest well-formed disc space labeled the  L5-S1 level.     Alignment: 4 mm retrolisthesis of L5 on S1, with trace  retrolisthesis of L2 on L3. Trace anterolisthesis of L3 on L4.  Findings chronic and facet mediated. Straightening of the normal  lumbar lordosis with underlying mild dextroscoliosis.     Vertebrae: Vertebral body height maintained without evidence acute  or chronic fracture. Bone marrow signal intensity somewhat  heterogeneous but within normal limits. No discrete or worrisome  osseous lesions. Discogenic reactive endplate changes seen about the  left aspect of the L4-5 interspace and right aspect of the L5-S1  interspace. No other abnormal marrow edema.     Conus medullaris and cauda equina: Conus extends to the L1 level.  Conus and cauda equina appear normal.     Paraspinal and other soft tissues: Paraspinous soft tissues  demonstrate no acute finding. T2 hyperintense simple cyst partially  visualize within the interpolar left kidney. Visualized visceral  structures otherwise unremarkable.     Disc levels:     L1-2: Negative interspace. Mild facet hypertrophy. No significant  canal or foraminal stenosis.     L2-3: Chronic intervertebral disc space narrowing with diffuse disc  bulge. Superimposed  mild reactive endplate changes. Moderate facet  hypertrophy. Prominence of the dorsal epidural fat. Changes  resultant moderate canal with bilateral lateral recess stenosis,  greater on the left. Mild bilateral L2 foraminal narrowing.     L3-4: Chronic intervertebral disc space narrowing with mild diffuse  disc bulge. There is a superimposed broad right extraforaminal disc  protrusion, closely approximating the exiting right L3 nerve root  (series 13, image 23). Moderate bilateral facet hypertrophy.  Resultant mild spinal stenosis. Mild to moderate bilateral L3  foraminal narrowing,  slightly worse on the right.     L4-5: Chronic intervertebral disc space narrowing with diffuse disc  bulge and disc desiccation. Disc bulging asymmetric to the left with  exuberant left-sided endplate osteophytic spurring. Superimposed  moderate left worse than right facet hypertrophy. Trace joint  effusion on the right. Resultant severe left foraminal stenosis with  probable impingement of the left L4 nerve root. Moderate to severe  left lateral recess stenosis. Central canal remains patent. Mild  right foraminal narrowing noted.     L5-S1: Advanced chronic intervertebral disc space narrowing with  diffuse disc bulge and disc desiccation. Extensive reactive endplate  changes with marginal endplate osteophytic spurring. Mild bilateral  facet hypertrophy with associated trace joint effusions. Broad-based  posterior disc osteophyte contacts the descending S1 nerve roots  bilaterally without frank neural impingement or displacement.  Moderate to severe bilateral L5 foraminal stenosis.     IMPRESSION:  1. Left eccentric disc osteophyte and facet hypertrophy at L4-5 with  resultant severe left foraminal stenosis, with moderate to severe  left lateral recess narrowing. Either the left L4 or descending L5  nerve roots could be affected.  2. Chronic degenerative disc osteophyte at L5-S1, contacting the  descending S1 nerve roots bilaterally without frank neural  impingement or displacement. Moderate to severe bilateral L5  foraminal stenosis at this level.  3. Right extraforaminal disc protrusion at L3-4, potentially  affecting the exiting right L3 nerve root.  4. Disc bulge with facet hypertrophy at L2-3 with resultant moderate  spinal stenosis.  5. Overall, these changes are progressed relative to 2016.        Electronically Signed    By: Jeannine Boga M.D.    On: 08/17/2019 08:22  Imaging: XR Lumbar Spine 2-3 Views  Result Date: 05/30/2022 No acute abnormalities.  Diffuse  degenerative changes throughout the lumbar spine.    PMFS History: Patient Active Problem List   Diagnosis Date Noted   Cervicalgia 01/06/2022   Nonallopathic lesion of lumbar region 07/28/2020   Nonallopathic lesion of sacral region 07/28/2020   Nonallopathic lesion of thoracic region 07/28/2020   Impaired glucose tolerance 12/04/2019   Low testosterone in male 06/11/2019   Controlled type 2 diabetes mellitus without complication, without long-term current use of insulin ([place]) 06/11/2019   Pain in left hand 03/28/2019   Hx of adenomatous colonic polyps 10/25/2017   Lumbar radiculopathy 05/11/2016   VERTIGO 02/22/2010   Obstructive sleep apnea 09/27/2009   ERECTILE DYSFUNCTION, ORGANIC 09/07/2009   CHEST PAIN 03/13/2008   OTHER SYMPTOMS INVOLVING DIGESTIVE SYSTEM OTHER 03/12/2008   RECTAL BLEEDING, HX OF 03/12/2008   Hypothyroidism 02/22/2007   Allergic rhinitis 02/22/2007   GERD 02/22/2007   LOW BACK PAIN 02/22/2007   Past Medical History:  Diagnosis Date   Allergic rhinitis    Allergy    Arthritis    back   ED (erectile dysfunction)    Elevated lipase    Enlarged prostate    GERD (  gastroesophageal reflux disease)    Hepatic steatosis    Hypothyroidism    Low back pain    Low testosterone    Sleep apnea    had sleep study 09-10-16- needs new cpap    Family History  Problem Relation Age of Onset   Cancer Father        sarcoma- both kidneys removed   Diabetes Father        s/p renal transplant   Arthritis Mother    High blood pressure Mother    High Cholesterol Mother    Heart disease Mother    Diabetes Brother    Diabetes Maternal Grandmother    Diabetes Paternal Grandmother    Cancer Paternal Grandmother        colon   Depression Other    Diabetes Other    Hypertension Other    Kidney disease Other    Obesity Sister    Stroke Maternal Grandfather 35       tobacco abuse   Alcohol abuse Maternal Grandfather    Early death Paternal Grandfather         accidental   Prostate cancer Maternal Uncle    Heart attack Maternal Uncle 60   Prostate cancer Maternal Uncle    Cancer Paternal Uncle        uncertain type   Alcohol abuse Paternal Uncle    Colon polyps Neg Hx    Rectal cancer Neg Hx    Stomach cancer Neg Hx    Esophageal cancer Neg Hx    Pancreatic cancer Neg Hx     Past Surgical History:  Procedure Laterality Date   COLONOSCOPY  03/19/2008; 2018   ext. hems    HERNIA REPAIR     x2 1968, inguinal Kiefer EXTRACTION     Social History   Occupational History   Not on file  Tobacco Use   Smoking status: Never   Smokeless tobacco: Never  Vaping Use   Vaping Use: Never used  Substance and Sexual Activity   Alcohol use: Yes    Comment: social   Drug use: No   Sexual activity: Not on file

## 2022-05-30 NOTE — Addendum Note (Signed)
Addended by: Lendon Collar on: 05/30/2022 08:33 AM   Modules accepted: Orders

## 2022-06-11 ENCOUNTER — Ambulatory Visit
Admission: RE | Admit: 2022-06-11 | Discharge: 2022-06-11 | Disposition: A | Discharge status: Home / Self Care | Payer: 59 | Source: Ambulatory Visit | Attending: Orthopaedic Surgery | Admitting: Orthopaedic Surgery

## 2022-06-11 DIAGNOSIS — M545 Low back pain, unspecified: Secondary | ICD-10-CM

## 2022-06-12 ENCOUNTER — Other Ambulatory Visit: Payer: Self-pay

## 2022-06-12 DIAGNOSIS — M545 Low back pain, unspecified: Secondary | ICD-10-CM

## 2022-06-12 NOTE — Progress Notes (Signed)
Mr. Searcy wants to follow up with Dr. Laurance Flatten to talk about his options based on this new MRI.  Thank you.

## 2022-06-13 ENCOUNTER — Encounter: Payer: Self-pay | Admitting: Orthopedic Surgery

## 2022-06-13 ENCOUNTER — Ambulatory Visit (INDEPENDENT_AMBULATORY_CARE_PROVIDER_SITE_OTHER): Payer: 59 | Admitting: Orthopedic Surgery

## 2022-06-13 ENCOUNTER — Ambulatory Visit (INDEPENDENT_AMBULATORY_CARE_PROVIDER_SITE_OTHER): Payer: 59

## 2022-06-13 VITALS — BP 123/74 | HR 63 | Ht 71.0 in | Wt 210.0 lb

## 2022-06-13 DIAGNOSIS — M5416 Radiculopathy, lumbar region: Secondary | ICD-10-CM | POA: Diagnosis not present

## 2022-06-13 DIAGNOSIS — M545 Low back pain, unspecified: Secondary | ICD-10-CM | POA: Diagnosis not present

## 2022-06-13 DIAGNOSIS — G8929 Other chronic pain: Secondary | ICD-10-CM

## 2022-06-13 NOTE — Progress Notes (Signed)
Orthopedic Spine Surgery Office Note  Assessment: Patient is a 57 y.o. male with tow problems. He has chronic low back pain and groin numbness.  Has L5-S1 disc bulge that is possibly compressing on the S1 nerves and bilateral foraminal stenosis at the level.   Plan: -I explained that his low back pain is likely due to the degenerative changes specially in the lower lumbar spine.  That may explain some of the popping sensation he gets as well when he is moving his back.  I told him that core strengthening and Tylenol would be good treatments for this.  He can also continue with RFA -In regards to his groin numbness, which seems to be his more concerning symptom, he needs further diagnostic workup.  Usually groin numbness is a sacral nerve root but possibly an L5 nerve root if it is coming from the spine. There are other etiologies outside of the spine as well. Recommended a diagnostic injection at L5/S1.  If this does not provide him any relief, then would work it up further with a MRI of the pelvis -Patient should return to office in 4 weeks, x-rays at next visit: none   Patient expressed understanding of the plan and all questions were answered to the patient's satisfaction.   ___________________________________________________________________________   History:  Patient is a 57 y.o. male who presents today for lumbar spine.  Patient has had a long history of low back pain.  He gets popping and clicking sensations in his back when he is moving it.  He does not have any pain radiating down the legs.  There is no trauma or injury that brought on this pain.  This pain is tolerable and he has done RFA in the past which helps.  He is more concerned because he has developed groin numbness within the last month.  He feels it in the genitals.  He has no perianal numbness.  He states that because of the numbness he cannot orgasm.  He has no numbness in the thighs.  He has no pain radiating into the groin  or the lower extremities.  No other numbness or paresthesias.  He said he has been seen by his urologist who did not think it was coming from anything within the genitourinary system.   Weakness: denies Symptoms of imbalance: denies Paresthesias and numbness: denies Bowel or bladder incontinence: denies Saddle anesthesia: groin numbness, specifically around the genitalia, no perianal numbness  Treatments tried: RFA, activity modification,   Review of systems: Denies fevers and chills, night sweats, unexplained weight loss, history of cancer, pain that wakes them at night  Past medical history: HTN Diabetes (last A1C was 5.9 on 01/20/2022) Sleep apnea Chronic pain Allergic rhinitis BPH GERD Hepatic steatosis Hypothyroidism  Allergies: NKDA  Past surgical history:  Hernia repair as a child Wisdom tooth extraction  Social history: Denies use of nicotine product (smoking, vaping, patches, smokeless) Alcohol use: yes, <1 drink per week Denies recreational drug use   Physical Exam:  General: no acute distress, appears stated age Neurologic: alert, answering questions appropriately, following commands Respiratory: unlabored breathing on room air, symmetric chest rise Psychiatric: appropriate affect, normal cadence to speech   MSK (spine):  -Strength exam      Left  Right EHL    5/5  5/5 TA    5/5  5/5 GSC    5/5  5/5 Knee extension  5/5  5/5 Hip flexion   5/5  5/5  -Sensory exam    Sensation  intact to light touch in L3-S1 nerve distributions of bilateral lower extremities  -Achilles DTR: 1/4 on the left, 1/4 on the right -Patellar tendon DTR: 1/4 on the left, 1/4 on the right  -Straight leg raise: negative -Contralateral straight leg raise: negative -Femoral nerve stretch test: negative bilaterally -Clonus: no beats bilaterally  -Left hip exam: no pain through range of motion, negative stinchfield, negative faber -Right hip exam: no pain through range of  motion, negative stinchfield, negative faber  Imaging: XR of the lumbar spine from 06/13/2022 and 05/30/2022 was independently reviewed and interpreted, showing disc height loss with anterior osteophyte formation at L4/5 and L5/S1. No fracture or dislocation. No evidence of instability on flexion/extension views. Osteophyte formation and subchondral sclerosis in the bilateral hips.   MRI of the lumbar spine from 06/11/2022 was independently reviewed and interpreted, showing DDD at L4/5 and L5/S1.  Foraminal stenosis bilaterally at L4/5 and L5/S1.  Central stenosis at L2/3.  Lateral recess stenosis at L3/4.  Far lateral osteophyte causing compression on the left.  L5-S1 disc bulge centrally that is immediately adjacent to the S1 nerves and may be causing compression of them.  Patient name: Walter Jordan. Patient MRN: [id number] Date of visit: 06/13/22

## 2022-06-19 ENCOUNTER — Other Ambulatory Visit: Payer: Self-pay | Admitting: Family Medicine

## 2022-06-19 DIAGNOSIS — M5416 Radiculopathy, lumbar region: Secondary | ICD-10-CM

## 2022-06-20 ENCOUNTER — Telehealth: Payer: Self-pay | Admitting: Physical Medicine and Rehabilitation

## 2022-06-20 MED ORDER — HYDROCODONE-ACETAMINOPHEN 7.5-325 MG PO TABS: 1.0000 | ORAL_TABLET | Freq: Two times a day (BID) | ORAL | 0 refills | Status: DC | PRN

## 2022-06-20 NOTE — Telephone Encounter (Signed)
Pt returned call to make an appt with Dr Ernestina Patches. Please call pt at [phone].

## 2022-06-20 NOTE — Telephone Encounter (Signed)
Spoke with patient and scheduled injection for 06/28/22

## 2022-06-22 ENCOUNTER — Ambulatory Visit: Payer: 59 | Admitting: Orthopedic Surgery

## 2022-06-22 DIAGNOSIS — M5416 Radiculopathy, lumbar region: Secondary | ICD-10-CM

## 2022-06-22 NOTE — Progress Notes (Signed)
Orthopedic Spine Surgery Office Note  Assessment: Patient is a 57 y.o. male with two problems. He has chronic low back pain and groin numbness.  Has L5-S1 disc bulge that is possibly compressing on the S1 nerves and bilateral foraminal stenosis at the level.    Plan: -I went over again that his back pain is probably due to the degenerative changes in his back, particularly in the lower lumbar spine. I recommended continued core strengthening and RFA. He asked about an inversion table and I told him that was okay if he got relief with that. He would not risk injuring his back with proper use of the table.  -He still has numbness in the groin but now has some pain going to that area. I told him that injection would still be my recommendation for diagnostic and therapeutic purposes -Patient should return to office in 3 weeks, x-rays at next visit: none   Patient expressed understanding of the plan and all questions were answered to the patient's satisfaction.   ___________________________________________________________________________  History: Patient is a 57 y.o. male who has been previously seen in the office for symptoms of groin numbness and low back pain. His groin numbness is at the point that he cannot orgasm because he cannot feel his genitals. He has developed pain in his groin and going to his genitals since the last time I saw him. His back pain is also more significant. Besides the groin on the left side, no other radiating pain in the lower extremities. No change in bowel or bladder habits. No saddle anesthesia.   Previous treatments: RFA, activity modification, narcotics, tylenol  COPY OF FIRST NOTE Patient is a 57 y.o. male who presents today for lumbar spine.  Patient has had a long history of low back pain.  He gets popping and clicking sensations in his back when he is moving it.  He does not have any pain radiating down the legs.  There is no trauma or injury that brought on  this pain.  This pain is tolerable and he has done RFA in the past which helps.  He is more concerned because he has developed groin numbness within the last month.  He feels it in the genitals.  He has no perianal numbness.  He states that because of the numbness he cannot orgasm.  He has no numbness in the thighs.  He has no pain radiating into the groin or the lower extremities.  No other numbness or paresthesias.  He said he has been seen by his urologist who did not think it was coming from anything within the genitourinary system.     Weakness: denies Symptoms of imbalance: denies Paresthesias and numbness: denies Bowel or bladder incontinence: denies Saddle anesthesia: groin numbness, specifically around the genitalia, no perianal numbness END OF COPY  Physical Exam:  General: no acute distress, appears stated age Neurologic: alert, answering questions appropriately, following commands Respiratory: unlabored breathing on room air, symmetric chest rise Psychiatric: appropriate affect, normal cadence to speech   MSK (spine):  -Strength exam      Left  Right EHL    5/5  5/5 TA    5/5  5/5 GSC    5/5  5/5 Knee extension  5/5  5/5 Hip flexion   5/5  5/5  -Sensory exam    Sensation intact to light touch in L3-S1 nerve distributions of bilateral lower extremities  -Achilles DTR: 1/4 on the left, 1/4 on the right -Patellar tendon DTR: 1/4  on the left, 1/4 on the right  -Straight leg raise: negative -Contralateral straight leg raise: negative -Clonus: no beats bilaterally  Imaging: XR of the lumbar spine from 06/13/2022 and 05/30/2022 was previously independently reviewed and interpreted, showing disc height loss with anterior osteophyte formation at L4/5 and L5/S1. No fracture or dislocation. No evidence of instability on flexion/extension views. Osteophyte formation and subchondral sclerosis in the bilateral hips.    MRI of the lumbar spine from 06/11/2022 was previously  independently reviewed and interpreted, showing DDD at L4/5 and L5/S1.  Foraminal stenosis bilaterally at L4/5 and L5/S1.  Central stenosis at L2/3.  Lateral recess stenosis at L3/4.  Far lateral osteophyte causing compression on the left.  L5-S1 disc bulge centrally that is immediately adjacent to the S1 nerves and may be causing compression of them.   Patient name: Walter Jordan. Patient MRN: [id number] Date of visit: 06/22/22

## 2022-06-23 ENCOUNTER — Encounter: Payer: Self-pay | Admitting: Family Medicine

## 2022-06-23 ENCOUNTER — Telehealth (INDEPENDENT_AMBULATORY_CARE_PROVIDER_SITE_OTHER): Payer: 59 | Admitting: Family Medicine

## 2022-06-23 VITALS — BP 124/73

## 2022-06-23 DIAGNOSIS — N400 Enlarged prostate without lower urinary tract symptoms: Secondary | ICD-10-CM | POA: Diagnosis not present

## 2022-06-23 DIAGNOSIS — M5416 Radiculopathy, lumbar region: Secondary | ICD-10-CM

## 2022-06-23 DIAGNOSIS — R7989 Other specified abnormal findings of blood chemistry: Secondary | ICD-10-CM

## 2022-06-23 MED ORDER — "BD LUER-LOK SYRINGE 23G X 1"" 3 ML MISC": 0 refills | Status: DC

## 2022-06-23 MED ORDER — CYCLOBENZAPRINE HCL 10 MG PO TABS: 10.0000 mg | ORAL_TABLET | Freq: Three times a day (TID) | ORAL | 5 refills | Status: DC | PRN

## 2022-06-23 MED ORDER — TESTOSTERONE CYPIONATE 200 MG/ML IM SOLN: 200.0000 mg | INTRAMUSCULAR | 2 refills | Status: DC

## 2022-06-23 NOTE — Progress Notes (Signed)
Virtual Medical Office Visit  Patient:  Walter Jordan.      Age: 57 y.o.       Sex:  male  Date:   06/23/2022  PCP:    Farrel Conners, MD   [place] Provider: Farrel Conners, MD    Assessment/Plan:   Summary assessment:  Walter Jordan was seen today for medical management of chronic issues.  Benign prostatic hyperplasia, unspecified whether lower urinary tract symptoms present -     PSA; Future  Low testosterone in male Assessment & Plan: Reviewed the urologist's note with the patient and we discussed changing the testosterone to 200 mg every 14 days and he is agreeable to this plan. I have refilled the needles and the testosterone vials for him today. He will get another testosterone level next week before his injection, then he will get another testosterone level on day 7 to make sure the dosing is adequate. I will see him back in 3 months in office.  Orders: -     Testosterone Cypionate; Inject 1 mL (200 mg total) into the muscle every 14 (fourteen) days.  Dispense: 2 mL; Refill: 2 -     BD Luer-Lok Syringe; Inject 200 mg testosterone every 2 weeks  Dispense: 30 each; Refill: 0 -     Testosterone; Future -     Testosterone; Future -     CBC; Future  Lumbar radiculopathy Assessment & Plan: Patient reports the 5 mg cyclobenzaprine is not strong enough for his back spasms, he would like to have the 10 mg refilled. PDMP reviewed, he continues to follow with the specialist who is planning possible injections to help with his pain. I reviewed the note from Dr. Laurance Flatten. I refilled his norco 7.5 mg BID PRN on 06/20/2022, last fill was 04/12/2022, so he is only taking about 1 tablet daily for severe pain. Will continue this medication as needed.   Orders: -     Cyclobenzaprine HCl; Take 1 tablet (10 mg total) by mouth 3 (three) times daily as needed for muscle spasms.  Dispense: 30 tablet; Refill: 5     Return in about 6 months (around 12/22/2022) for medication refills.   He  was advised to call the office or go to ER if his condition worsens    Subjective:   Walter Jordan. is a 57 y.o. male with PMH significant for: Past Medical History:  Diagnosis Date   Allergic rhinitis    Allergy    Arthritis    back   ED (erectile dysfunction)    Elevated lipase    Enlarged prostate    GERD (gastroesophageal reflux disease)    Hepatic steatosis    Hypothyroidism    Low back pain    Low testosterone    Sleep apnea    had sleep study 09-10-16- needs new cpap     Presenting today with: Chief Complaint  Patient presents with   Medical Management of Chronic Issues     He clarifies and reports that his condition: Patient is being seen today for his medication refills. We connected via video enabled telecommunication. Low T-- we reviewed his last testosterone level which was very low. States that this was the day he was supposed to get his injection. He reports that he spoke with his urologist about this last month and the urologist suggested changing him to 100 mg every week and that he can self-administer his injection in his leg. I have read  the note from his urologist confirming this information.  Chronic lumbar radiculopathy - I reviewed the note from his specialist Dr. Laurance Flatten with the patient. He is considering getting injections to help relieve his pain. Pt reports that the norco 7.5 mg is only used as needed for severe pain. States it continues to help with his chronic pain.  Pt reports that he would like to have the 10 mg cyclobenzaprine tablets instead of the 5 mg that was prescribed to him by Dr. Erlinda Hong.       Current Outpatient Medications  Medication Instructions   alfuzosin (UROXATRAL) 10 mg, Oral, Daily   aspirin EC 81 mg, Oral, Daily, Swallow whole.   cyclobenzaprine (FLEXERIL) 10 mg, Oral, 3 times daily PRN   diazepam (VALIUM) 5 mg, Oral, Every 12 hours PRN   empagliflozin (JARDIANCE) 10 mg, Oral, Daily before breakfast   HYDROcodone-acetaminophen  (NORCO) 7.5-325 MG tablet 1 tablet, Oral, 2 times daily PRN   levothyroxine (SYNTHROID) 50 MCG tablet TAKE 1 TABLET BY MOUTH EVERY DAY BEFORE BREAKFAST   losartan (COZAAR) 25 mg, Oral, Daily   Multiple Vitamins-Minerals (MEGA MULTIVITAMIN FOR MEN PO) Oral, Daily,     rosuvastatin (CRESTOR) 5 mg, Oral, Daily   SYRINGE-NEEDLE, DISP, 3 ML (B-D 3CC LUER-LOK SYR 23GX1") 23G X 1" 3 ML MISC Inject 200 mg testosterone every 2 weeks   testosterone cypionate (DEPOTESTOSTERONE CYPIONATE) 200 mg, Intramuscular, Every 14 days       Objective/Observations  Physical Exam:  Polite and friendly Gen: NAD, resting comfortably, smiling and pleasant affect Pulm: Normal work of breathing Neuro: Grossly normal, moves all extremities Psych: Normal affect and thought content Problem specific physical exam findings:    No images are attached to the encounter or orders placed in the encounter.    Results: No results found for any visits on 06/23/22.   No results found for this or any previous visit (from the past 2160 hour(s)).         Virtual Visit via Video   I connected with [name] on 06/23/22 at  8:30 AM EST by a video enabled telemedicine application and verified that I am speaking with the correct person using two identifiers. The limitations of evaluation and management by telemedicine and the availability of in person appointments were discussed. The patient expressed understanding and agreed to proceed.   Percentage of appointment time on video:  95% Patient location: Home Provider location: [facility] Persons participating in the virtual visit: Myself and Patient     I spent 30 minutes in this face to face video enabled encounter with the patient today.

## 2022-06-23 NOTE — Assessment & Plan Note (Signed)
Reviewed the urologist's note with the patient and we discussed changing the testosterone to 200 mg every 14 days and he is agreeable to this plan. I have refilled the needles and the testosterone vials for him today. He will get another testosterone level next week before his injection, then he will get another testosterone level on day 7 to make sure the dosing is adequate. I will see him back in 3 months in office.

## 2022-06-23 NOTE — Assessment & Plan Note (Addendum)
Patient reports the 5 mg cyclobenzaprine is not strong enough for his back spasms, he would like to have the 10 mg refilled. PDMP reviewed, he continues to follow with the specialist who is planning possible injections to help with his pain. I reviewed the note from Dr. Laurance Flatten. I refilled his norco 7.5 mg BID PRN on 06/20/2022, last fill was 04/12/2022, so he is only taking about 1 tablet daily for severe pain. Will continue this medication as needed.

## 2022-06-28 ENCOUNTER — Ambulatory Visit: Payer: Self-pay

## 2022-06-28 ENCOUNTER — Ambulatory Visit (INDEPENDENT_AMBULATORY_CARE_PROVIDER_SITE_OTHER): Payer: 59 | Admitting: Physical Medicine and Rehabilitation

## 2022-06-28 VITALS — BP 120/82 | HR 78

## 2022-06-28 DIAGNOSIS — M5416 Radiculopathy, lumbar region: Secondary | ICD-10-CM

## 2022-06-28 MED ORDER — METHYLPREDNISOLONE ACETATE 80 MG/ML IJ SUSP
80.0000 mg | Freq: Once | INTRAMUSCULAR | Status: AC
  Administered 2022-06-28: 80 mg

## 2022-06-28 NOTE — Progress Notes (Signed)
Functional Pain Scale - descriptive words and definitions  No Pain (0)   No Pain/Loss of function  Average Pain  varies   +Driver, -BT, -Dye Allergies.  Lower back pain on both sides, no radiation into legs

## 2022-06-28 NOTE — Patient Instructions (Signed)

## 2022-07-04 NOTE — Progress Notes (Signed)
Walter Jordan. - 57 y.o. male MRN [id number]  Date of birth: [birth year]  Office Visit Note: Visit Date: 06/28/2022 PCP: Farrel Conners, MD Referred by: Callie Fielding, MD  Subjective: Chief Complaint  Patient presents with   Lower Back - Pain   HPI:  Walter Jordan. is a 57 y.o. male who comes in today at the request of Dr. Ileene Rubens for planned Bilateral S1-2 Lumbar Transforaminal epidural steroid injection with fluoroscopic guidance.  The patient has failed conservative care including home exercise, medications, time and activity modification.  This injection will be diagnostic and hopefully therapeutic.  Please see requesting physician notes for further details and justification.   ROS Otherwise per HPI.  Assessment & Plan: Visit Diagnoses:    ICD-10-CM   1. Lumbar radiculopathy  M54.16 XR C-ARM NO REPORT    Epidural Steroid injection    methylPREDNISolone acetate (DEPO-MEDROL) injection 80 mg      Plan: No additional findings.   Meds & Orders:  Meds ordered this encounter  Medications   methylPREDNISolone acetate (DEPO-MEDROL) injection 80 mg    Orders Placed This Encounter  Procedures   XR C-ARM NO REPORT   Epidural Steroid injection    Follow-up: Return for visit to requesting provider as needed.   Procedures: No procedures performed  Lumbosacral Transforaminal Epidural Steroid Injection - Sub-Pedicular Approach with Fluoroscopic Guidance  Patient: Walter Jordan.      Date of Birth: [birth year] MRN: [id number] PCP: Farrel Conners, MD      Visit Date: 06/28/2022   Universal Protocol:    Date/Time: 06/28/2022  Consent Given By: the patient  Position: PRONE  Additional Comments: Vital signs were monitored before and after the procedure. Patient was prepped and draped in the usual sterile fashion. The correct patient, procedure, and site was verified.   Injection Procedure Details:   Procedure diagnoses: Lumbar radiculopathy [M54.16]     Meds Administered:  Meds ordered this encounter  Medications   methylPREDNISolone acetate (DEPO-MEDROL) injection 80 mg    Laterality: Bilateral  Location/Site: S1  Needle:3.5 in., 22 ga.  Short bevel or Quincke spinal needle  Needle Placement: Transforaminal  Findings:    -Comments: Excellent flow of contrast along the nerve, nerve root and into the epidural space.  Procedure Details: After squaring off the end-plates to get a true AP view, the C-arm was positioned so that an oblique view of the foramen as noted above was visualized. The target area is just inferior to the "nose of the scotty dog" or sub pedicular. The soft tissues overlying this structure were infiltrated with 2-3 ml. of 1% Lidocaine without Epinephrine.  The spinal needle was inserted toward the target using a "trajectory" view along the fluoroscope beam.  Under AP and lateral visualization, the needle was advanced so it did not puncture dura and was located close the 6 O'Clock position of the pedical in AP tracterory. Biplanar projections were used to confirm position. Aspiration was confirmed to be negative for CSF and/or blood. A 1-2 ml. volume of Isovue-250 was injected and flow of contrast was noted at each level. Radiographs were obtained for documentation purposes.   After attaining the desired flow of contrast documented above, a 0.5 to 1.0 ml test dose of 0.25% Marcaine was injected into each respective transforaminal space.  The patient was observed for 90 seconds post injection.  After no sensory deficits were reported, and normal lower extremity motor function was noted,  the above injectate was administered so that equal amounts of the injectate were placed at each foramen (level) into the transforaminal epidural space.   Additional Comments:  No complications occurred Dressing: 2 x 2 sterile gauze and Band-Aid    Post-procedure details: Patient was observed during the procedure. Post-procedure  instructions were reviewed.  Patient left the clinic in stable condition.    Clinical History: MRI LUMBAR SPINE WITHOUT CONTRAST     TECHNIQUE:  Multiplanar, multisequence MR imaging of the lumbar spine was  performed. No intravenous contrast was administered.     COMPARISON:  Previous MRI from 07/28/2014.     FINDINGS:  Segmentation: Standard. Lowest well-formed disc space labeled the  L5-S1 level.     Alignment: 4 mm retrolisthesis of L5 on S1, with trace  retrolisthesis of L2 on L3. Trace anterolisthesis of L3 on L4.  Findings chronic and facet mediated. Straightening of the normal  lumbar lordosis with underlying mild dextroscoliosis.     Vertebrae: Vertebral body height maintained without evidence acute  or chronic fracture. Bone marrow signal intensity somewhat  heterogeneous but within normal limits. No discrete or worrisome  osseous lesions. Discogenic reactive endplate changes seen about the  left aspect of the L4-5 interspace and right aspect of the L5-S1  interspace. No other abnormal marrow edema.     Conus medullaris and cauda equina: Conus extends to the L1 level.  Conus and cauda equina appear normal.     Paraspinal and other soft tissues: Paraspinous soft tissues  demonstrate no acute finding. T2 hyperintense simple cyst partially  visualize within the interpolar left kidney. Visualized visceral  structures otherwise unremarkable.     Disc levels:     L1-2: Negative interspace. Mild facet hypertrophy. No significant  canal or foraminal stenosis.     L2-3: Chronic intervertebral disc space narrowing with diffuse disc  bulge. Superimposed mild reactive endplate changes. Moderate facet  hypertrophy. Prominence of the dorsal epidural fat. Changes  resultant moderate canal with bilateral lateral recess stenosis,  greater on the left. Mild bilateral L2 foraminal narrowing.     L3-4: Chronic intervertebral disc space narrowing with mild diffuse  disc bulge.  There is a superimposed broad right extraforaminal disc  protrusion, closely approximating the exiting right L3 nerve root  (series 13, image 23). Moderate bilateral facet hypertrophy.  Resultant mild spinal stenosis. Mild to moderate bilateral L3  foraminal narrowing, slightly worse on the right.     L4-5: Chronic intervertebral disc space narrowing with diffuse disc  bulge and disc desiccation. Disc bulging asymmetric to the left with  exuberant left-sided endplate osteophytic spurring. Superimposed  moderate left worse than right facet hypertrophy. Trace joint  effusion on the right. Resultant severe left foraminal stenosis with  probable impingement of the left L4 nerve root. Moderate to severe  left lateral recess stenosis. Central canal remains patent. Mild  right foraminal narrowing noted.     L5-S1: Advanced chronic intervertebral disc space narrowing with  diffuse disc bulge and disc desiccation. Extensive reactive endplate  changes with marginal endplate osteophytic spurring. Mild bilateral  facet hypertrophy with associated trace joint effusions. Broad-based  posterior disc osteophyte contacts the descending S1 nerve roots  bilaterally without frank neural impingement or displacement.  Moderate to severe bilateral L5 foraminal stenosis.     IMPRESSION:  1. Left eccentric disc osteophyte and facet hypertrophy at L4-5 with  resultant severe left foraminal stenosis, with moderate to severe  left lateral recess narrowing. Either the left L4  or descending L5  nerve roots could be affected.  2. Chronic degenerative disc osteophyte at L5-S1, contacting the  descending S1 nerve roots bilaterally without frank neural  impingement or displacement. Moderate to severe bilateral L5  foraminal stenosis at this level.  3. Right extraforaminal disc protrusion at L3-4, potentially  affecting the exiting right L3 nerve root.  4. Disc bulge with facet hypertrophy at L2-3 with resultant  moderate  spinal stenosis.  5. Overall, these changes are progressed relative to 2016.        Electronically Signed    By: Jeannine Boga M.D.    On: 08/17/2019 08:22     Objective:  VS:  HT:    WT:   BMI:     BP:120/82  HR:78bpm  TEMP: ( )  RESP:  Physical Exam Vitals and nursing note reviewed.  Constitutional:      General: He is not in acute distress.    Appearance: Normal appearance. He is not ill-appearing.  HENT:     Head: Normocephalic and atraumatic.     Right Ear: External ear normal.     Left Ear: External ear normal.     Nose: No congestion.  Eyes:     Extraocular Movements: Extraocular movements intact.  Cardiovascular:     Rate and Rhythm: Normal rate.     Pulses: Normal pulses.  Pulmonary:     Effort: Pulmonary effort is normal. No respiratory distress.  Abdominal:     General: There is no distension.     Palpations: Abdomen is soft.  Musculoskeletal:        General: No tenderness or signs of injury.     Cervical back: Neck supple.     Right lower leg: No edema.     Left lower leg: No edema.     Comments: Patient has good distal strength without clonus.  Skin:    Findings: No erythema or rash.  Neurological:     General: No focal deficit present.     Mental Status: He is alert and oriented to person, place, and time.     Sensory: No sensory deficit.     Motor: No weakness or abnormal muscle tone.     Coordination: Coordination normal.  Psychiatric:        Mood and Affect: Mood normal.        Behavior: Behavior normal.      Imaging: No results found.

## 2022-07-04 NOTE — Procedures (Signed)
Lumbosacral Transforaminal Epidural Steroid Injection - Sub-Pedicular Approach with Fluoroscopic Guidance  Patient: Walter Jordan.      Date of Birth: 1966-02-10 MRN: [id number] PCP: Farrel Conners, MD      Visit Date: 06/28/2022   Universal Protocol:    Date/Time: 06/28/2022  Consent Given By: the patient  Position: PRONE  Additional Comments: Vital signs were monitored before and after the procedure. Patient was prepped and draped in the usual sterile fashion. The correct patient, procedure, and site was verified.   Injection Procedure Details:   Procedure diagnoses: Lumbar radiculopathy [M54.16]    Meds Administered:  Meds ordered this encounter  Medications   methylPREDNISolone acetate (DEPO-MEDROL) injection 80 mg    Laterality: Bilateral  Location/Site: S1  Needle:3.5 in., 22 ga.  Short bevel or Quincke spinal needle  Needle Placement: Transforaminal  Findings:    -Comments: Excellent flow of contrast along the nerve, nerve root and into the epidural space.  Procedure Details: After squaring off the end-plates to get a true AP view, the C-arm was positioned so that an oblique view of the foramen as noted above was visualized. The target area is just inferior to the "nose of the scotty dog" or sub pedicular. The soft tissues overlying this structure were infiltrated with 2-3 ml. of 1% Lidocaine without Epinephrine.  The spinal needle was inserted toward the target using a "trajectory" view along the fluoroscope beam.  Under AP and lateral visualization, the needle was advanced so it did not puncture dura and was located close the 6 O'Clock position of the pedical in AP tracterory. Biplanar projections were used to confirm position. Aspiration was confirmed to be negative for CSF and/or blood. A 1-2 ml. volume of Isovue-250 was injected and flow of contrast was noted at each level. Radiographs were obtained for documentation purposes.   After attaining the  desired flow of contrast documented above, a 0.5 to 1.0 ml test dose of 0.25% Marcaine was injected into each respective transforaminal space.  The patient was observed for 90 seconds post injection.  After no sensory deficits were reported, and normal lower extremity motor function was noted,   the above injectate was administered so that equal amounts of the injectate were placed at each foramen (level) into the transforaminal epidural space.   Additional Comments:  No complications occurred Dressing: 2 x 2 sterile gauze and Band-Aid    Post-procedure details: Patient was observed during the procedure. Post-procedure instructions were reviewed.  Patient left the clinic in stable condition.

## 2022-07-13 ENCOUNTER — Ambulatory Visit: Payer: 59 | Admitting: Orthopedic Surgery

## 2022-07-27 ENCOUNTER — Ambulatory Visit: Payer: 59 | Admitting: Orthopedic Surgery

## 2022-07-27 DIAGNOSIS — M545 Low back pain, unspecified: Secondary | ICD-10-CM | POA: Diagnosis not present

## 2022-07-27 DIAGNOSIS — G8929 Other chronic pain: Secondary | ICD-10-CM

## 2022-07-27 NOTE — Progress Notes (Signed)
Orthopedic Spine Surgery Office Note  Assessment: Patient is a 57 y.o. male with low back pain that is severe in nature.  No radicular symptoms. DDD at L4/5 and L5/S1.    Plan: -Patient has tried  RFA, activity modification, narcotics, tylenol  -Patient inquired about trying chiropractic treatment.  I told him that it is worth trying if he gets relief he can keep going. -I told him I did not recommend surgery for degenerative disc disease.  His PI-LL mismatch is about 10 degrees so I am hesitant to attribute this severe pain due to borderline mismatch. I told him the results of fusion surgery for DDD are unpredictable with significant risk of surgery.  He should keep seeing pain management in addition to trying the chiropractor -Patient should return to office on an as-needed basis   Patient expressed understanding of the plan and all questions were answered to the patient's satisfaction.   ___________________________________________________________________________  History: Patient is a 57 y.o. male who has been previously seen in the office for symptoms of low back pain and left-sided groin numbness with difficulty achieving orgasm.  He underwent an injection at L5/S1 after our last visit.  He said some of his groin numbness improved and he is not concerned about those symptoms at this time.  He is coming back because he had increasingly severe low back pain.  Pain is felt even when laying in bed but it is not as severe specialties flat on his back.  He has recently increased his dose of hydrocodone to help with the pain.  He is not having any pain radiating into his legs.  There is no recent trauma or injury.  Previous treatments:  RFA, activity modification, narcotics, tylenol, lumbar injection  Physical Exam:  General: no acute distress, appears stated age Neurologic: alert, answering questions appropriately, following commands Respiratory: unlabored breathing on room air, symmetric  chest rise Psychiatric: appropriate affect, normal cadence to speech   MSK (spine):  -Strength exam      Left  Right EHL    5/5  5/5 TA    5/5  5/5 GSC    5/5  5/5 Knee extension  5/5  5/5 Hip flexion   5/5  5/5  -Sensory exam    Sensation intact to light touch in L3-S1 nerve distributions of bilateral lower extremities  -Achilles DTR: 1/4 on the left, 1/4 on the right -Patellar tendon DTR: 1/4 on the left, 1/4 on the right  -Straight leg raise: negative  -Contralateral straight leg raise: negative -Femoral nerve stretch test: negative bilaterally -Clonus: no beats bilaterally  Imaging: XR of the lumbar spine from 06/13/2022 and 05/30/2022 was previously independently reviewed and interpreted, showing disc height loss with anterior osteophyte formation at L4/5 and L5/S1. No fracture or dislocation. No evidence of instability on flexion/extension views. Osteophyte formation and subchondral sclerosis in the bilateral hips.    MRI of the lumbar spine from 06/11/2022 was previously independently reviewed and interpreted, showing DDD at L4/5 and L5/S1.  Foraminal stenosis bilaterally at L4/5 and L5/S1.  Central stenosis at L2/3.  Lateral recess stenosis at L3/4.  Far lateral osteophyte causing compression on the left.  L5-S1 disc bulge centrally that is immediately adjacent to the S1 nerves and may be causing compression of them. Right L1/2 small foraminal disc herniation.  PI-LL mismatch approximately 10 degrees   Patient name: Walter Jordan. Patient MRN: [id number] Date of visit: 07/27/22

## 2022-08-19 ENCOUNTER — Other Ambulatory Visit: Payer: Self-pay | Admitting: Family Medicine

## 2022-08-19 DIAGNOSIS — E119 Type 2 diabetes mellitus without complications: Secondary | ICD-10-CM

## 2022-08-24 ENCOUNTER — Other Ambulatory Visit: Payer: Self-pay | Admitting: Family Medicine

## 2022-08-24 DIAGNOSIS — M5416 Radiculopathy, lumbar region: Secondary | ICD-10-CM

## 2022-08-24 MED ORDER — HYDROCODONE-ACETAMINOPHEN 7.5-325 MG PO TABS: 1.0000 | ORAL_TABLET | Freq: Two times a day (BID) | ORAL | 0 refills | Status: DC | PRN

## 2022-09-21 ENCOUNTER — Ambulatory Visit: Payer: 59

## 2022-09-22 ENCOUNTER — Other Ambulatory Visit: Payer: 59

## 2022-09-22 ENCOUNTER — Ambulatory Visit (INDEPENDENT_AMBULATORY_CARE_PROVIDER_SITE_OTHER): Payer: 59

## 2022-09-22 DIAGNOSIS — R7989 Other specified abnormal findings of blood chemistry: Secondary | ICD-10-CM | POA: Diagnosis not present

## 2022-09-22 DIAGNOSIS — N400 Enlarged prostate without lower urinary tract symptoms: Secondary | ICD-10-CM | POA: Diagnosis not present

## 2022-09-22 LAB — TESTOSTERONE: Testosterone: 361.72 ng/dL (ref 300.00–890.00)

## 2022-09-22 LAB — CBC
HCT: 47.2 % (ref 39.0–52.0)
Hemoglobin: 16.1 g/dL (ref 13.0–17.0)
MCHC: 34.1 g/dL (ref 30.0–36.0)
MCV: 94.7 fl (ref 78.0–100.0)
Platelets: 227 10*3/uL (ref 150.0–400.0)
RBC: 4.98 Mil/uL (ref 4.22–5.81)
RDW: 13.5 % (ref 11.5–15.5)
WBC: 4.5 10*3/uL (ref 4.0–10.5)

## 2022-09-22 LAB — PSA: PSA: 1.91 ng/mL (ref 0.10–4.00)

## 2022-09-22 MED ORDER — TESTOSTERONE CYPIONATE 200 MG/ML IM SOLN
200.0000 mg | INTRAMUSCULAR | Status: DC
  Administered 2022-09-22 – 2023-01-26 (×3): 200 mg via INTRAMUSCULAR

## 2022-09-22 NOTE — Progress Notes (Signed)
Per orders of Dr. Casimiro Needle, injection of Testosterone Cypionate 200mg   given by Vickii Chafe. Patient tolerated injection well.   Pt brought in 2 vials of Testosterone cyp 200mg  with instruction: Inject 2 mL(400mg  mg total) into the muscle every 21 days from [facility].   400mg  was drawn. Upon looking at pt's med list, was instructed to inject 102mL(200mg ) every 14 days.   Brought attention to Dr. Casimiro Needle and her nurse. Dr. Casimiro Needle confirmed pt is to take 1 mL.   200mg  Injection was given after testosterone level was drawn. Pt was advise to recheck testosterone level after 7 days. After 7 days, pt can get 200mg  injection again.   LAB was scheduled.  Pt needs a refill on testosterone for his next injection as both vials was drawn today.

## 2022-09-29 ENCOUNTER — Other Ambulatory Visit: Payer: 59

## 2022-10-11 ENCOUNTER — Ambulatory Visit: Payer: 59 | Admitting: Orthopaedic Surgery

## 2022-10-11 ENCOUNTER — Ambulatory Visit (INDEPENDENT_AMBULATORY_CARE_PROVIDER_SITE_OTHER): Payer: 59

## 2022-10-11 ENCOUNTER — Other Ambulatory Visit: Payer: Self-pay

## 2022-10-11 ENCOUNTER — Encounter: Payer: Self-pay | Admitting: Orthopaedic Surgery

## 2022-10-11 ENCOUNTER — Ambulatory Visit (INDEPENDENT_AMBULATORY_CARE_PROVIDER_SITE_OTHER): Payer: 59 | Admitting: Sports Medicine

## 2022-10-11 DIAGNOSIS — M25512 Pain in left shoulder: Secondary | ICD-10-CM | POA: Diagnosis not present

## 2022-10-11 DIAGNOSIS — G8929 Other chronic pain: Secondary | ICD-10-CM

## 2022-10-11 DIAGNOSIS — M25522 Pain in left elbow: Secondary | ICD-10-CM

## 2022-10-11 MED ORDER — BUPIVACAINE HCL 0.25 % IJ SOLN
1.0000 mL | INTRAMUSCULAR | Status: AC | PRN
  Administered 2022-10-11: 1 mL via INTRA_ARTICULAR

## 2022-10-11 MED ORDER — LIDOCAINE HCL 1 % IJ SOLN
1.0000 mL | INTRAMUSCULAR | Status: AC | PRN
  Administered 2022-10-11: 1 mL

## 2022-10-11 MED ORDER — BETAMETHASONE SOD PHOS & ACET 6 (3-3) MG/ML IJ SUSP
6.0000 mg | INTRAMUSCULAR | Status: AC | PRN
  Administered 2022-10-11: 6 mg via INTRA_ARTICULAR

## 2022-10-11 NOTE — Progress Notes (Signed)
   Procedure Note  Patient: Walter Jordan.             Date of Birth: 01-14-1966           MRN: [id number]             Visit Date: 10/11/2022  Procedures: Visit Diagnoses:  1. Chronic left shoulder pain   2. Pain in left elbow    Large Joint Inj: R glenohumeral on 10/11/2022 12:49 PM Indications: pain and diagnostic evaluation Details: 22 G 3.5 in needle, ultrasound-guided anterior approach Medications: 1 mL lidocaine 1 %; 1 mL bupivacaine 0.25 %; 6 mg betamethasone acetate-betamethasone sodium phosphate 6 (3-3) MG/ML Outcome: tolerated well, no immediate complications  US-Guided Anterior shoulder joint injection, left shoulder  After discussion on risks/benefits/indications, informed verbal consent was obtained. A timeout was then performed. Patient was placed in supine position on the table in exam room. The patient's anterior shoulder was prepped with betadine and alcohol swabs. Utilizing ultrasound guidance, the biceps tendon sheath and anterior shoulder joint was evaluated with ultrasound utilizing the rotator cuff interval review.  The overlying soft tissue was anesthetized with 2 cc of lidocaine 1%. Then using ultrasound guidance and a 22G, 1.5" needle under ultrasound guidance, the needle was inserted between the coracohumeral ligament and bicep tendon and the anterior shoulder joint was injected via an in-plane approach with a 1:1:1 lidocaine:bupivicaine:betamethasone. Patient tolerated the procedure well without immediate complications.   Procedure, treatment alternatives, risks and benefits explained, specific risks discussed. Consent was given by the patient. Immediately prior to procedure a time out was called to verify the correct patient, procedure, equipment, support staff and site/side marked as required. Patient was prepped and draped in the usual sterile fashion.     - I evaluated the patient about 5 minutes post-injection and they had improvement in pain and range of  motion - follow-up with Mardella Layman and Dr. Roda Shutters as indicated; I am happy to see them as needed  Madelyn Brunner, DO Primary Care Sports Medicine Physician  [facility]  This note was dictated using Dragon naturally speaking software and may contain errors in syntax, spelling, or content which have not been identified prior to signing this note.

## 2022-10-11 NOTE — Progress Notes (Signed)
Office Visit Note   Patient: Walter Jordan.           Date of Birth: 1965/08/03           MRN: [id number] Visit Date: 10/11/2022              Requested by: Karie Georges, MD [address] [place],  [place] [address] PCP: Karie Georges, MD   Assessment & Plan: Visit Diagnoses:  1. Chronic left shoulder pain     Plan: Impression is chronic left shoulder pain likely from underlying proximal biceps tendinitis.  We have discussed referral to Dr. Shon Baton for ultrasound-guided glenohumeral cortisone injection for which she is agreeable to.  He will follow-up with [place] as needed.  Follow-Up Instructions: Return if symptoms worsen or fail to improve.   Orders:  Orders Placed This Encounter  Procedures   XR Shoulder Left   No orders of the defined types were placed in this encounter.     Procedures: No procedures performed   Clinical Data: No additional findings.   Subjective: Chief Complaint  Patient presents with   Left Shoulder - Pain    HPI patient is a pleasant 57 year old gentleman who comes in today with left shoulder pain.  The pain he has is to the anterior aspect and primarily occurs when he is lying on his left side for greater than 10 minutes.  He is also noticed that he is unable to lift heavy weights any longer at the gym.  He has been taking Aleve without any relief.  He denies any paresthesias to the left upper extremity.  Review of Systems as detailed in HPI.  All others reviewed and are negative.   Objective: Vital Signs: There were no vitals taken for this visit.  Physical Exam well-developed well-nourished gentleman in no acute distress.  Alert and oriented x 3.  Ortho Exam left shoulder exam reveals full active range of motion in all planes.  No pain or weakness with empty can or O'Brien's testing.  He does have pain with speeds testing.  He has pain with resisted external rotation.  Full strength throughout.  He is neurovascularly  intact distally.  Specialty Comments:  MRI LUMBAR SPINE WITHOUT CONTRAST     TECHNIQUE:  Multiplanar, multisequence MR imaging of the lumbar spine was  performed. No intravenous contrast was administered.     COMPARISON:  Previous MRI from 07/28/2014.     FINDINGS:  Segmentation: Standard. Lowest well-formed disc space labeled the  L5-S1 level.     Alignment: 4 mm retrolisthesis of L5 on S1, with trace  retrolisthesis of L2 on L3. Trace anterolisthesis of L3 on L4.  Findings chronic and facet mediated. Straightening of the normal  lumbar lordosis with underlying mild dextroscoliosis.     Vertebrae: Vertebral body height maintained without evidence acute  or chronic fracture. Bone marrow signal intensity somewhat  heterogeneous but within normal limits. No discrete or worrisome  osseous lesions. Discogenic reactive endplate changes seen about the  left aspect of the L4-5 interspace and right aspect of the L5-S1  interspace. No other abnormal marrow edema.     Conus medullaris and cauda equina: Conus extends to the L1 level.  Conus and cauda equina appear normal.     Paraspinal and other soft tissues: Paraspinous soft tissues  demonstrate no acute finding. T2 hyperintense simple cyst partially  visualize within the interpolar left kidney. Visualized visceral  structures otherwise unremarkable.     Disc  levels:     L1-2: Negative interspace. Mild facet hypertrophy. No significant  canal or foraminal stenosis.     L2-3: Chronic intervertebral disc space narrowing with diffuse disc  bulge. Superimposed mild reactive endplate changes. Moderate facet  hypertrophy. Prominence of the dorsal epidural fat. Changes  resultant moderate canal with bilateral lateral recess stenosis,  greater on the left. Mild bilateral L2 foraminal narrowing.     L3-4: Chronic intervertebral disc space narrowing with mild diffuse  disc bulge. There is a superimposed broad right extraforaminal disc   protrusion, closely approximating the exiting right L3 nerve root  (series 13, image 23). Moderate bilateral facet hypertrophy.  Resultant mild spinal stenosis. Mild to moderate bilateral L3  foraminal narrowing, slightly worse on the right.     L4-5: Chronic intervertebral disc space narrowing with diffuse disc  bulge and disc desiccation. Disc bulging asymmetric to the left with  exuberant left-sided endplate osteophytic spurring. Superimposed  moderate left worse than right facet hypertrophy. Trace joint  effusion on the right. Resultant severe left foraminal stenosis with  probable impingement of the left L4 nerve root. Moderate to severe  left lateral recess stenosis. Central canal remains patent. Mild  right foraminal narrowing noted.     L5-S1: Advanced chronic intervertebral disc space narrowing with  diffuse disc bulge and disc desiccation. Extensive reactive endplate  changes with marginal endplate osteophytic spurring. Mild bilateral  facet hypertrophy with associated trace joint effusions. Broad-based  posterior disc osteophyte contacts the descending S1 nerve roots  bilaterally without frank neural impingement or displacement.  Moderate to severe bilateral L5 foraminal stenosis.     IMPRESSION:  1. Left eccentric disc osteophyte and facet hypertrophy at L4-5 with  resultant severe left foraminal stenosis, with moderate to severe  left lateral recess narrowing. Either the left L4 or descending L5  nerve roots could be affected.  2. Chronic degenerative disc osteophyte at L5-S1, contacting the  descending S1 nerve roots bilaterally without frank neural  impingement or displacement. Moderate to severe bilateral L5  foraminal stenosis at this level.  3. Right extraforaminal disc protrusion at L3-4, potentially  affecting the exiting right L3 nerve root.  4. Disc bulge with facet hypertrophy at L2-3 with resultant moderate  spinal stenosis.  5. Overall, these changes are  progressed relative to 2016.        Electronically Signed    By: Rise Mu M.D.    On: 08/17/2019 08:22  Imaging: XR Shoulder Left  Result Date: 10/11/2022 No acute abnormalities.  Unfavorable acromial anatomy with significant curvature.    PMFS History: Patient Active Problem List   Diagnosis Date Noted   Cervicalgia 01/06/2022   Nonallopathic lesion of lumbar region 07/28/2020   Nonallopathic lesion of sacral region 07/28/2020   Nonallopathic lesion of thoracic region 07/28/2020   Impaired glucose tolerance 12/04/2019   Low testosterone in male 06/11/2019   Controlled type 2 diabetes mellitus without complication, without long-term current use of insulin (HCC) 06/11/2019   Pain in left hand 03/28/2019   Hx of adenomatous colonic polyps 10/25/2017   Lumbar radiculopathy 05/11/2016   VERTIGO 02/22/2010   Obstructive sleep apnea 09/27/2009   ERECTILE DYSFUNCTION, ORGANIC 09/07/2009   CHEST PAIN 03/13/2008   OTHER SYMPTOMS INVOLVING DIGESTIVE SYSTEM OTHER 03/12/2008   RECTAL BLEEDING, HX OF 03/12/2008   Hypothyroidism 02/22/2007   Allergic rhinitis 02/22/2007   GERD 02/22/2007   LOW BACK PAIN 02/22/2007   Past Medical History:  Diagnosis Date   Allergic rhinitis  Allergy    Arthritis    back   ED (erectile dysfunction)    Elevated lipase    Enlarged prostate    GERD (gastroesophageal reflux disease)    Hepatic steatosis    Hypothyroidism    Low back pain    Low testosterone    Sleep apnea    had sleep study 09-10-16- needs new cpap    Family History  Problem Relation Age of Onset   Cancer Father        sarcoma- both kidneys removed   Diabetes Father        s/p renal transplant   Arthritis Mother    High blood pressure Mother    High Cholesterol Mother    Heart disease Mother    Diabetes Brother    Diabetes Maternal Grandmother    Diabetes Paternal Grandmother    Cancer Paternal Grandmother        colon   Depression Other    Diabetes  Other    Hypertension Other    Kidney disease Other    Obesity Sister    Stroke Maternal Grandfather 60       tobacco abuse   Alcohol abuse Maternal Grandfather    Early death Paternal Grandfather        accidental   Prostate cancer Maternal Uncle    Heart attack Maternal Uncle 60   Prostate cancer Maternal Uncle    Cancer Paternal Uncle        uncertain type   Alcohol abuse Paternal Uncle    Colon polyps Neg Hx    Rectal cancer Neg Hx    Stomach cancer Neg Hx    Esophageal cancer Neg Hx    Pancreatic cancer Neg Hx     Past Surgical History:  Procedure Laterality Date   COLONOSCOPY  03/19/2008; 2018   ext. hems    HERNIA REPAIR     x2 1968, inguinal 1973   WISDOM TOOTH EXTRACTION     Social History   Occupational History   Not on file  Tobacco Use   Smoking status: Never   Smokeless tobacco: Never  Vaping Use   Vaping Use: Never used  Substance and Sexual Activity   Alcohol use: Yes    Comment: social   Drug use: No   Sexual activity: Not on file

## 2022-10-19 ENCOUNTER — Other Ambulatory Visit: Payer: Self-pay | Admitting: Family Medicine

## 2022-10-19 DIAGNOSIS — I10 Essential (primary) hypertension: Secondary | ICD-10-CM

## 2022-11-10 ENCOUNTER — Other Ambulatory Visit: Payer: Self-pay | Admitting: Family Medicine

## 2022-11-10 DIAGNOSIS — M5416 Radiculopathy, lumbar region: Secondary | ICD-10-CM

## 2022-11-10 MED ORDER — HYDROCODONE-ACETAMINOPHEN 7.5-325 MG PO TABS: 1.0000 | ORAL_TABLET | Freq: Two times a day (BID) | ORAL | 0 refills | Status: DC | PRN

## 2022-11-13 ENCOUNTER — Ambulatory Visit: Payer: 59 | Admitting: Family Medicine

## 2022-11-13 VITALS — BP 112/88 | HR 63 | Temp 98.5°F | Ht 71.0 in | Wt 218.4 lb

## 2022-11-13 DIAGNOSIS — M5416 Radiculopathy, lumbar region: Secondary | ICD-10-CM | POA: Diagnosis not present

## 2022-11-13 MED ORDER — PREDNISONE 20 MG PO TABS: ORAL_TABLET | ORAL | 0 refills | Status: AC

## 2022-11-13 MED ORDER — HYDROCODONE-ACETAMINOPHEN 10-325 MG PO TABS: 1.0000 | ORAL_TABLET | Freq: Two times a day (BID) | ORAL | 0 refills | Status: AC | PRN

## 2022-11-13 MED ORDER — KETOROLAC TROMETHAMINE 60 MG/2ML IM SOLN
60.0000 mg | Freq: Once | INTRAMUSCULAR | Status: AC
  Administered 2022-11-13: 60 mg via INTRAMUSCULAR

## 2022-11-13 NOTE — Progress Notes (Signed)
Acute Office Visit  Subjective:     Patient ID: Walter Jordan., male    DOB: 12/29/1965, 57 y.o.   MRN: [id number]  Chief Complaint  Patient presents with   Back Pain    Patient complains of severe left-sided low back pain, radiates to the right thigh and groin x5 days, worse when lying down, worse when attempting to sit up, decreased when walking, initially unable to lie down on the right side, taking Hydrocodone and Flexeril with no relief    Back Pain   Patient is in today for acute left sided back pain, states he is not sure what he did to injure it, states it happened at work, he bent over to do something. Left side is extremely painful, he is having pain, cannot lay down at all, sitting up is ok and walking is ok, but he cannot lay down at all, states that the pain is radiating down the left side into the left groin. I reviewed his   Review of Systems  Musculoskeletal:  Positive for back pain.  All other systems reviewed and are negative.       Objective:    BP 112/88 (BP Location: Left Arm, Patient Position: Sitting, Cuff Size: Large)   Pulse 63   Temp 98.5 F (36.9 C) (Oral)   Ht 5\' 11"  (1.803 m)   Wt 218 lb 6.4 oz (99.1 kg)   SpO2 98%   BMI 30.46 kg/m    Physical Exam Vitals reviewed.  Constitutional:      Appearance: Normal appearance. He is well-groomed and normal weight.  Cardiovascular:     Rate and Rhythm: Normal rate and regular rhythm.     Heart sounds: S1 normal and S2 normal. No murmur heard. Pulmonary:     Effort: Pulmonary effort is normal.     Breath sounds: Normal air entry.  Musculoskeletal:     Lumbar back: Spasms present. No bony tenderness. Decreased range of motion.     Right lower leg: No edema.     Left lower leg: No edema.  Neurological:     General: No focal deficit present.     Mental Status: He is alert and oriented to person, place, and time.     Gait: Gait is intact.  Psychiatric:        Mood and Affect: Mood and affect  normal.   MRI LUMBAR SPINE January 2024:  IMPRESSION: 1. Unchanged large left extraforaminal osteophyte at L4-L5 that displaces the exiting left L4 nerve root and causes severe left neural foraminal stenosis. 2. Unchanged moderate bilateral L5-S1 neural foraminal stenosis. 3. Unchanged mild spinal canal stenosis at L2-L3.      No results found for any visits on 11/13/22.      Assessment & Plan:   Problem List Items Addressed This Visit   None Visit Diagnoses     Acute lumbar radiculopathy    -  Primary   Relevant Medications   ketorolac (TORADOL) injection 60 mg   HYDROcodone-acetaminophen (NORCO) 10-325 MG tablet   predniSONE (DELTASONE) 20 MG tablet      Severe findings at L4-5 on the left side on the MRI in January, is already seeing the spince specialist and getting injections, will give 7 days of norco 10 mg BID and give steroids and injection of ketorolac today. He has appt with Dr. Roda Shutters tomorrow. RTC in September for his regular follow up.   Meds ordered this encounter  Medications   ketorolac (TORADOL)  injection 60 mg   HYDROcodone-acetaminophen (NORCO) 10-325 MG tablet    Sig: Take 1 tablet by mouth 2 (two) times daily as needed for up to 7 days.    Dispense:  14 tablet    Refill:  0   predniSONE (DELTASONE) 20 MG tablet    Sig: Take 3 tablets (60 mg total) by mouth daily with breakfast for 2 days, THEN 2 tablets (40 mg total) daily with breakfast for 2 days, THEN 1 tablet (20 mg total) daily with breakfast for 2 days, THEN 0.5 tablets (10 mg total) daily with breakfast for 2 days.    Dispense:  13 tablet    Refill:  0    Return in about 3 months (around 02/13/2023), or if symptoms worsen or fail to improve.  Karie Georges, MD

## 2022-11-14 ENCOUNTER — Ambulatory Visit: Payer: 59 | Admitting: Physician Assistant

## 2022-11-14 DIAGNOSIS — M5440 Lumbago with sciatica, unspecified side: Secondary | ICD-10-CM

## 2022-11-14 NOTE — Progress Notes (Signed)
Office Visit Note   Patient: Walter Jordan.           Date of Birth: [birth year]           MRN: [id number] Visit Date: 11/14/2022              Requested by: Karie Georges, MD [address] [place],  [place] [address] PCP: Karie Georges, MD   Assessment & Plan: Visit Diagnoses:  1. Low back pain with sciatica, sciatica laterality unspecified, unspecified back pain laterality, unspecified chronicity     Plan: Impression is lumbar spine degenerative disc disease.  We have discussed continuing with the steroid, muscle relaxer and pain medicine as prescribed by his PCP yesterday.  Should his symptoms not improve or worsen, he will follow-up with Dr. Christell Constant for further evaluation and treatment recommendation.  Follow-up with [place] as needed.  Call with concerns or questions.  Follow-Up Instructions: Return if symptoms worsen or fail to improve.   Orders:  No orders of the defined types were placed in this encounter.  No orders of the defined types were placed in this encounter.     Procedures: No procedures performed   Clinical Data: No additional findings.   Subjective: Chief Complaint  Patient presents with   Lower Back - Pain    HPI patient is a pleasant 57 year old gentleman who comes in today with recurrent low back pain.  History of degenerative disc disease L4-5 and L5-S1.  He has seen [place] as well as Dr. Christell Constant for this in the past.  He has tried RFA, Tylenol and narcotics with some improvement of symptoms.  Surgical intervention was not recommended per Dr. Christell Constant.  His pain has returned and has worsened over the past few days.  No known injury or change in activity.  Symptoms are worse lying down.  He was seen by his primary care provider yesterday where he was giving a shot of Toradol as well as prescriptions for Norco, prednisone and Flexeril for which he started today.  Currently denies any bowel or bladder change or saddle paresthesias.  Review of Systems  as detailed in HPI.  All others reviewed and are negative.   Objective: Vital Signs: There were no vitals taken for this visit.  Physical Exam well-developed well-nourished gentleman in no acute distress.  Alert and oriented x 3.  Ortho Exam back exam: Positive straight leg raise on the left.  No focal weakness.  No pain with lumbar flexion or extension.  No spinous or paraspinous tenderness.  He is neurovascular intact distally.  Specialty Comments:  MRI LUMBAR SPINE WITHOUT CONTRAST     TECHNIQUE:  Multiplanar, multisequence MR imaging of the lumbar spine was  performed. No intravenous contrast was administered.     COMPARISON:  Previous MRI from 07/28/2014.     FINDINGS:  Segmentation: Standard. Lowest well-formed disc space labeled the  L5-S1 level.     Alignment: 4 mm retrolisthesis of L5 on S1, with trace  retrolisthesis of L2 on L3. Trace anterolisthesis of L3 on L4.  Findings chronic and facet mediated. Straightening of the normal  lumbar lordosis with underlying mild dextroscoliosis.     Vertebrae: Vertebral body height maintained without evidence acute  or chronic fracture. Bone marrow signal intensity somewhat  heterogeneous but within normal limits. No discrete or worrisome  osseous lesions. Discogenic reactive endplate changes seen about the  left aspect of the L4-5 interspace and right aspect of the L5-S1  interspace. No  other abnormal marrow edema.     Conus medullaris and cauda equina: Conus extends to the L1 level.  Conus and cauda equina appear normal.     Paraspinal and other soft tissues: Paraspinous soft tissues  demonstrate no acute finding. T2 hyperintense simple cyst partially  visualize within the interpolar left kidney. Visualized visceral  structures otherwise unremarkable.     Disc levels:     L1-2: Negative interspace. Mild facet hypertrophy. No significant  canal or foraminal stenosis.     L2-3: Chronic intervertebral disc space narrowing  with diffuse disc  bulge. Superimposed mild reactive endplate changes. Moderate facet  hypertrophy. Prominence of the dorsal epidural fat. Changes  resultant moderate canal with bilateral lateral recess stenosis,  greater on the left. Mild bilateral L2 foraminal narrowing.     L3-4: Chronic intervertebral disc space narrowing with mild diffuse  disc bulge. There is a superimposed broad right extraforaminal disc  protrusion, closely approximating the exiting right L3 nerve root  (series 13, image 23). Moderate bilateral facet hypertrophy.  Resultant mild spinal stenosis. Mild to moderate bilateral L3  foraminal narrowing, slightly worse on the right.     L4-5: Chronic intervertebral disc space narrowing with diffuse disc  bulge and disc desiccation. Disc bulging asymmetric to the left with  exuberant left-sided endplate osteophytic spurring. Superimposed  moderate left worse than right facet hypertrophy. Trace joint  effusion on the right. Resultant severe left foraminal stenosis with  probable impingement of the left L4 nerve root. Moderate to severe  left lateral recess stenosis. Central canal remains patent. Mild  right foraminal narrowing noted.     L5-S1: Advanced chronic intervertebral disc space narrowing with  diffuse disc bulge and disc desiccation. Extensive reactive endplate  changes with marginal endplate osteophytic spurring. Mild bilateral  facet hypertrophy with associated trace joint effusions. Broad-based  posterior disc osteophyte contacts the descending S1 nerve roots  bilaterally without frank neural impingement or displacement.  Moderate to severe bilateral L5 foraminal stenosis.     IMPRESSION:  1. Left eccentric disc osteophyte and facet hypertrophy at L4-5 with  resultant severe left foraminal stenosis, with moderate to severe  left lateral recess narrowing. Either the left L4 or descending L5  nerve roots could be affected.  2. Chronic degenerative disc  osteophyte at L5-S1, contacting the  descending S1 nerve roots bilaterally without frank neural  impingement or displacement. Moderate to severe bilateral L5  foraminal stenosis at this level.  3. Right extraforaminal disc protrusion at L3-4, potentially  affecting the exiting right L3 nerve root.  4. Disc bulge with facet hypertrophy at L2-3 with resultant moderate  spinal stenosis.  5. Overall, these changes are progressed relative to 2016.        Electronically Signed    By: Rise Mu M.D.    On: 08/17/2019 08:22  Imaging: No results found.   PMFS History: Patient Active Problem List   Diagnosis Date Noted   Cervicalgia 01/06/2022   Nonallopathic lesion of lumbar region 07/28/2020   Nonallopathic lesion of sacral region 07/28/2020   Nonallopathic lesion of thoracic region 07/28/2020   Impaired glucose tolerance 12/04/2019   Low testosterone in male 06/11/2019   Controlled type 2 diabetes mellitus without complication, without long-term current use of insulin (HCC) 06/11/2019   Pain in left hand 03/28/2019   Hx of adenomatous colonic polyps 10/25/2017   Lumbar radiculopathy 05/11/2016   VERTIGO 02/22/2010   Obstructive sleep apnea 09/27/2009   ERECTILE DYSFUNCTION, ORGANIC 09/07/2009  CHEST PAIN 03/13/2008   OTHER SYMPTOMS INVOLVING DIGESTIVE SYSTEM OTHER 03/12/2008   RECTAL BLEEDING, HX OF 03/12/2008   Hypothyroidism 02/22/2007   Allergic rhinitis 02/22/2007   GERD 02/22/2007   LOW BACK PAIN 02/22/2007   Past Medical History:  Diagnosis Date   Allergic rhinitis    Allergy    Arthritis    back   ED (erectile dysfunction)    Elevated lipase    Enlarged prostate    GERD (gastroesophageal reflux disease)    Hepatic steatosis    Hypothyroidism    Low back pain    Low testosterone    Sleep apnea    had sleep study 09-10-16- needs new cpap    Family History  Problem Relation Age of Onset   Cancer Father        sarcoma- both kidneys removed    Diabetes Father        s/p renal transplant   Arthritis Mother    High blood pressure Mother    High Cholesterol Mother    Heart disease Mother    Diabetes Brother    Diabetes Maternal Grandmother    Diabetes Paternal Grandmother    Cancer Paternal Grandmother        colon   Depression Other    Diabetes Other    Hypertension Other    Kidney disease Other    Obesity Sister    Stroke Maternal Grandfather 60       tobacco abuse   Alcohol abuse Maternal Grandfather    Early death Paternal Grandfather        accidental   Prostate cancer Maternal Uncle    Heart attack Maternal Uncle 60   Prostate cancer Maternal Uncle    Cancer Paternal Uncle        uncertain type   Alcohol abuse Paternal Uncle    Colon polyps Neg Hx    Rectal cancer Neg Hx    Stomach cancer Neg Hx    Esophageal cancer Neg Hx    Pancreatic cancer Neg Hx     Past Surgical History:  Procedure Laterality Date   COLONOSCOPY  03/19/2008; 2018   ext. hems    HERNIA REPAIR     x2 1968, inguinal 1973   WISDOM TOOTH EXTRACTION     Social History   Occupational History   Not on file  Tobacco Use   Smoking status: Never   Smokeless tobacco: Never  Vaping Use   Vaping Use: Never used  Substance and Sexual Activity   Alcohol use: Yes    Comment: social   Drug use: No   Sexual activity: Not on file

## 2022-11-15 ENCOUNTER — Other Ambulatory Visit: Payer: Self-pay | Admitting: Family Medicine

## 2022-11-15 ENCOUNTER — Telehealth: Payer: Self-pay | Admitting: *Deleted

## 2022-11-15 DIAGNOSIS — M5416 Radiculopathy, lumbar region: Secondary | ICD-10-CM

## 2022-11-15 MED ORDER — HYDROCODONE-ACETAMINOPHEN 5-325 MG PO TABS: 1.0000 | ORAL_TABLET | Freq: Two times a day (BID) | ORAL | 0 refills | Status: DC | PRN

## 2022-11-15 NOTE — Telephone Encounter (Signed)
-----   Message from Barbara M Michael, MD sent at 11/15/2022  1:09 PM EDT ----- Please call patient and let him  know that his 7.5 mg norco is out of stock at the pharmacy, so I called in the 5 mg norco for him instead   

## 2022-11-15 NOTE — Telephone Encounter (Signed)
Unable to leave a message due to voicemail not being set up. 

## 2022-11-16 ENCOUNTER — Telehealth: Payer: Self-pay | Admitting: *Deleted

## 2022-11-16 NOTE — Telephone Encounter (Signed)
Unable to leave a message due to voicemail not being set up. 

## 2022-11-16 NOTE — Telephone Encounter (Signed)
-----   Message from Karie Georges, MD sent at 11/15/2022  1:09 PM EDT ----- Please call patient and let him  know that his 7.5 mg norco is out of stock at the pharmacy, so I called in the 5 mg norco for him instead

## 2022-11-27 ENCOUNTER — Other Ambulatory Visit: Payer: Self-pay | Admitting: Family Medicine

## 2022-11-27 DIAGNOSIS — E119 Type 2 diabetes mellitus without complications: Secondary | ICD-10-CM

## 2022-11-30 ENCOUNTER — Other Ambulatory Visit: Payer: Self-pay | Admitting: Family Medicine

## 2022-11-30 DIAGNOSIS — R7989 Other specified abnormal findings of blood chemistry: Secondary | ICD-10-CM

## 2022-12-01 ENCOUNTER — Ambulatory Visit: Payer: 59

## 2022-12-01 ENCOUNTER — Telehealth: Payer: Self-pay | Admitting: Family Medicine

## 2022-12-01 DIAGNOSIS — R7989 Other specified abnormal findings of blood chemistry: Secondary | ICD-10-CM

## 2022-12-01 NOTE — Telephone Encounter (Signed)
Pt will be here today to see the Nurse at 9:45 am.  Does Pt have a current order or does his order need to be updated?  Please advise.

## 2022-12-01 NOTE — Telephone Encounter (Signed)
Unable to leave a message at the cell number due to voicemail not being set up.

## 2022-12-01 NOTE — Telephone Encounter (Signed)
Pt called back to say it is for testosterone, and he is bringing it in with him.

## 2022-12-01 NOTE — Progress Notes (Signed)
Per orders of Dr. Casimiro Needle, injection of Testosterone 200 mg given by Kathreen Devoid. Patient tolerated injection well.

## 2022-12-08 ENCOUNTER — Other Ambulatory Visit: Payer: Self-pay | Admitting: Family Medicine

## 2022-12-08 DIAGNOSIS — M5416 Radiculopathy, lumbar region: Secondary | ICD-10-CM

## 2022-12-11 MED ORDER — HYDROCODONE-ACETAMINOPHEN 7.5-325 MG PO TABS: 1.0000 | ORAL_TABLET | Freq: Four times a day (QID) | ORAL | 0 refills | Status: DC | PRN

## 2022-12-14 ENCOUNTER — Ambulatory Visit: Payer: 59 | Admitting: *Deleted

## 2022-12-14 DIAGNOSIS — R7989 Other specified abnormal findings of blood chemistry: Secondary | ICD-10-CM

## 2022-12-14 MED ORDER — TESTOSTERONE CYPIONATE 200 MG/ML IM SOLN
200.0000 mg | Freq: Once | INTRAMUSCULAR | Status: AC
  Administered 2022-12-14: 200 mg via INTRAMUSCULAR

## 2022-12-14 NOTE — Progress Notes (Signed)
Per orders of Dr. Casimiro Needle, injection of Testosterone Cypionate 200mg  given by Sallee Lange A. Patient tolerated injection well.  Approval was also given via teams by Dr Casimiro Needle to administer the injection 1 day early.

## 2022-12-29 ENCOUNTER — Ambulatory Visit: Payer: 59 | Admitting: *Deleted

## 2022-12-29 DIAGNOSIS — R7989 Other specified abnormal findings of blood chemistry: Secondary | ICD-10-CM | POA: Diagnosis not present

## 2022-12-29 MED ORDER — TESTOSTERONE CYPIONATE 200 MG/ML IM SOLN
200.0000 mg | Freq: Once | INTRAMUSCULAR | Status: AC
  Administered 2022-12-29: 200 mg via INTRAMUSCULAR

## 2022-12-29 NOTE — Progress Notes (Signed)
Per orders of Dr. Legrand Como, injection of Testosterone cypionate '200mg'$  given by Lahoma Crocker A. Patient tolerated injection well.

## 2023-01-04 ENCOUNTER — Encounter (INDEPENDENT_AMBULATORY_CARE_PROVIDER_SITE_OTHER): Payer: Self-pay

## 2023-01-05 ENCOUNTER — Other Ambulatory Visit (HOSPITAL_BASED_OUTPATIENT_CLINIC_OR_DEPARTMENT_OTHER): Payer: Self-pay | Admitting: Cardiology

## 2023-01-05 DIAGNOSIS — E119 Type 2 diabetes mellitus without complications: Secondary | ICD-10-CM

## 2023-01-10 ENCOUNTER — Ambulatory Visit: Payer: 59

## 2023-01-11 ENCOUNTER — Ambulatory Visit (INDEPENDENT_AMBULATORY_CARE_PROVIDER_SITE_OTHER): Payer: 59

## 2023-01-11 DIAGNOSIS — E039 Hypothyroidism, unspecified: Secondary | ICD-10-CM

## 2023-01-11 MED ORDER — TESTOSTERONE CYPIONATE 200 MG/ML IM SOLN
200.0000 mg | INTRAMUSCULAR | Status: DC
  Administered 2023-01-11: 200 mg via INTRAMUSCULAR

## 2023-01-11 NOTE — Progress Notes (Signed)
After obtaining consent, and per orders of Dr. Britta Mccreedy, injection of testosterone 200 mg/mL given by Carola Rhine. Patient instructed to remain in clinic for 20 minutes afterwards, and to report any adverse reaction to me immediately.

## 2023-01-17 ENCOUNTER — Other Ambulatory Visit: Payer: Self-pay | Admitting: Family Medicine

## 2023-01-17 DIAGNOSIS — M5416 Radiculopathy, lumbar region: Secondary | ICD-10-CM

## 2023-01-17 MED ORDER — HYDROCODONE-ACETAMINOPHEN 7.5-325 MG PO TABS: 1.0000 | ORAL_TABLET | Freq: Four times a day (QID) | ORAL | 0 refills | Status: DC | PRN

## 2023-01-18 IMAGING — DX DG CHEST 2V
2 series · 2 of 2 positions shown · non-contrast
Comparison: None.

CLINICAL DATA: Mass with tenderness in the right anterior chest
wall. Sharp pain for 3-4 months.

EXAM:
CHEST - 2 VIEW

[chest pa]
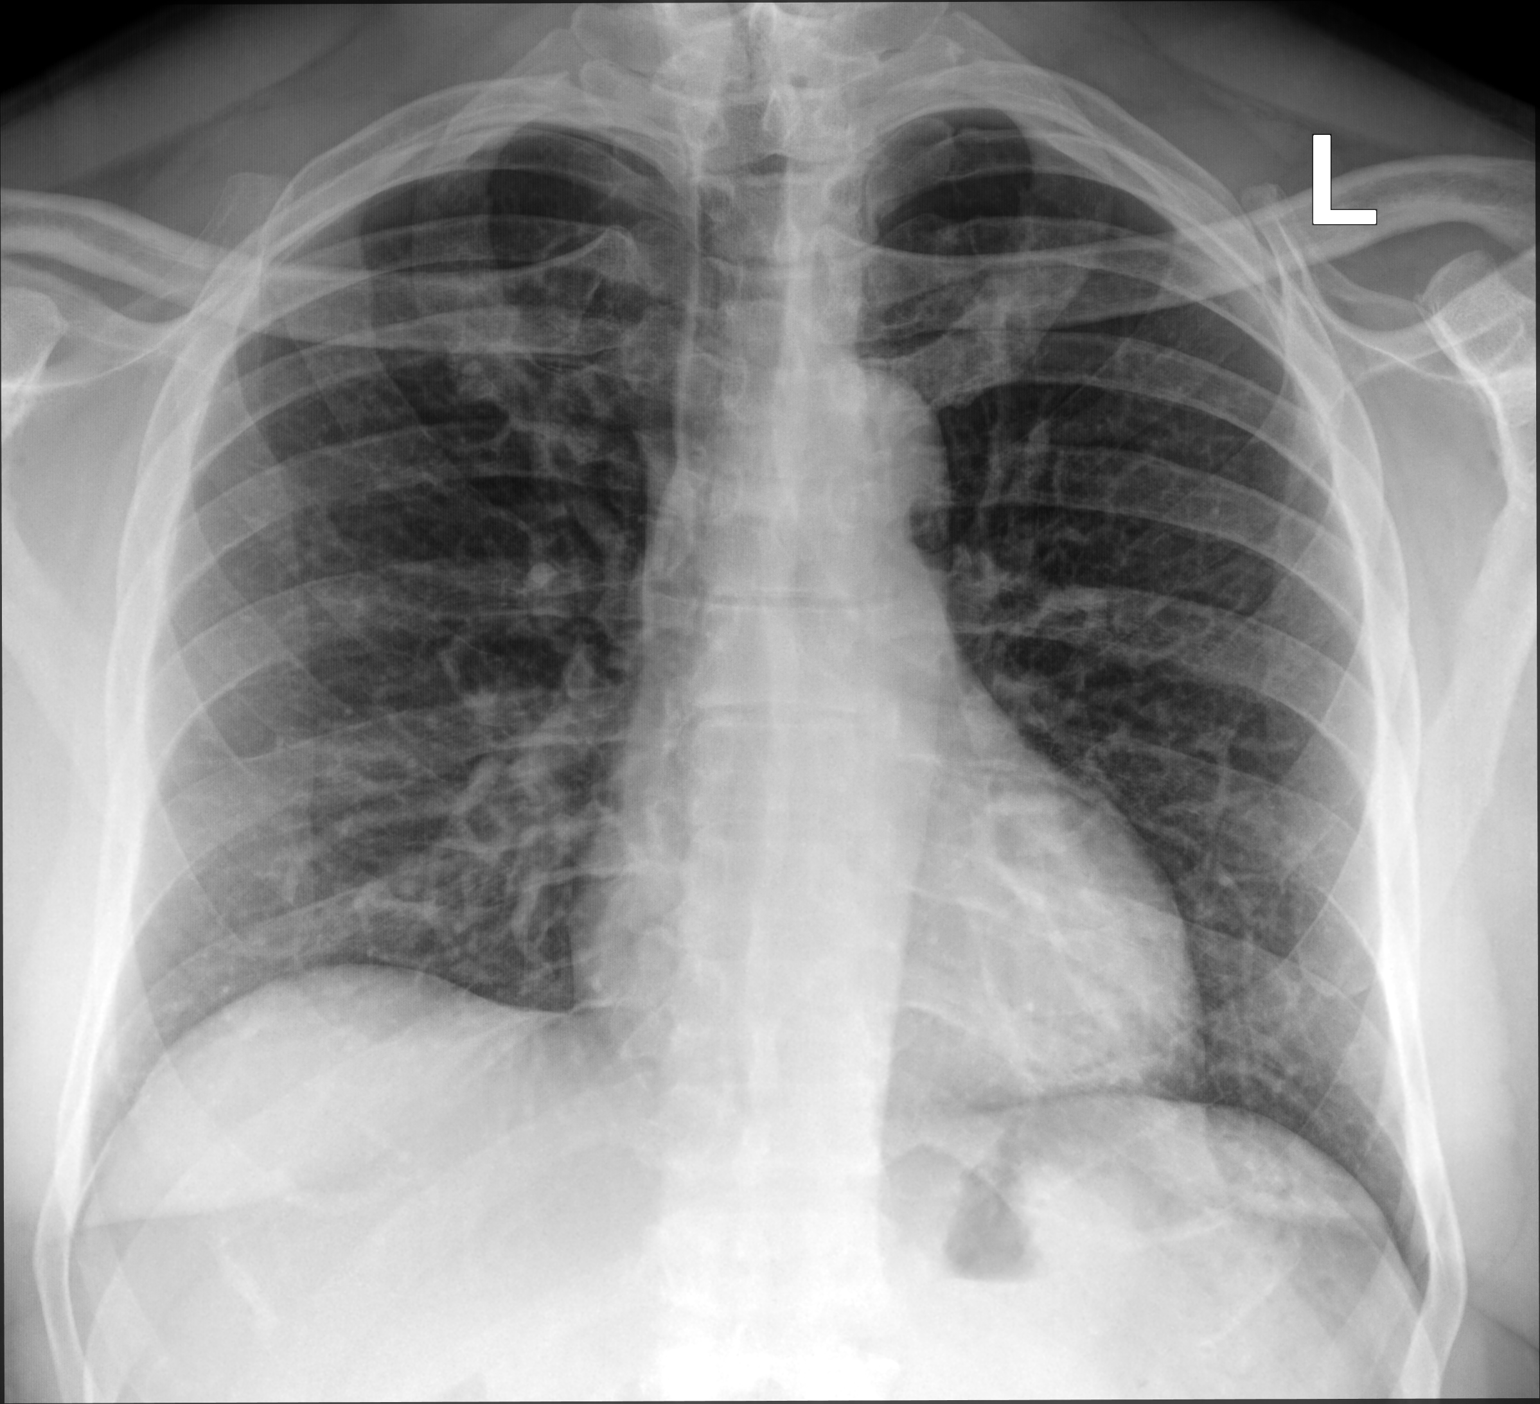

[chest lat]
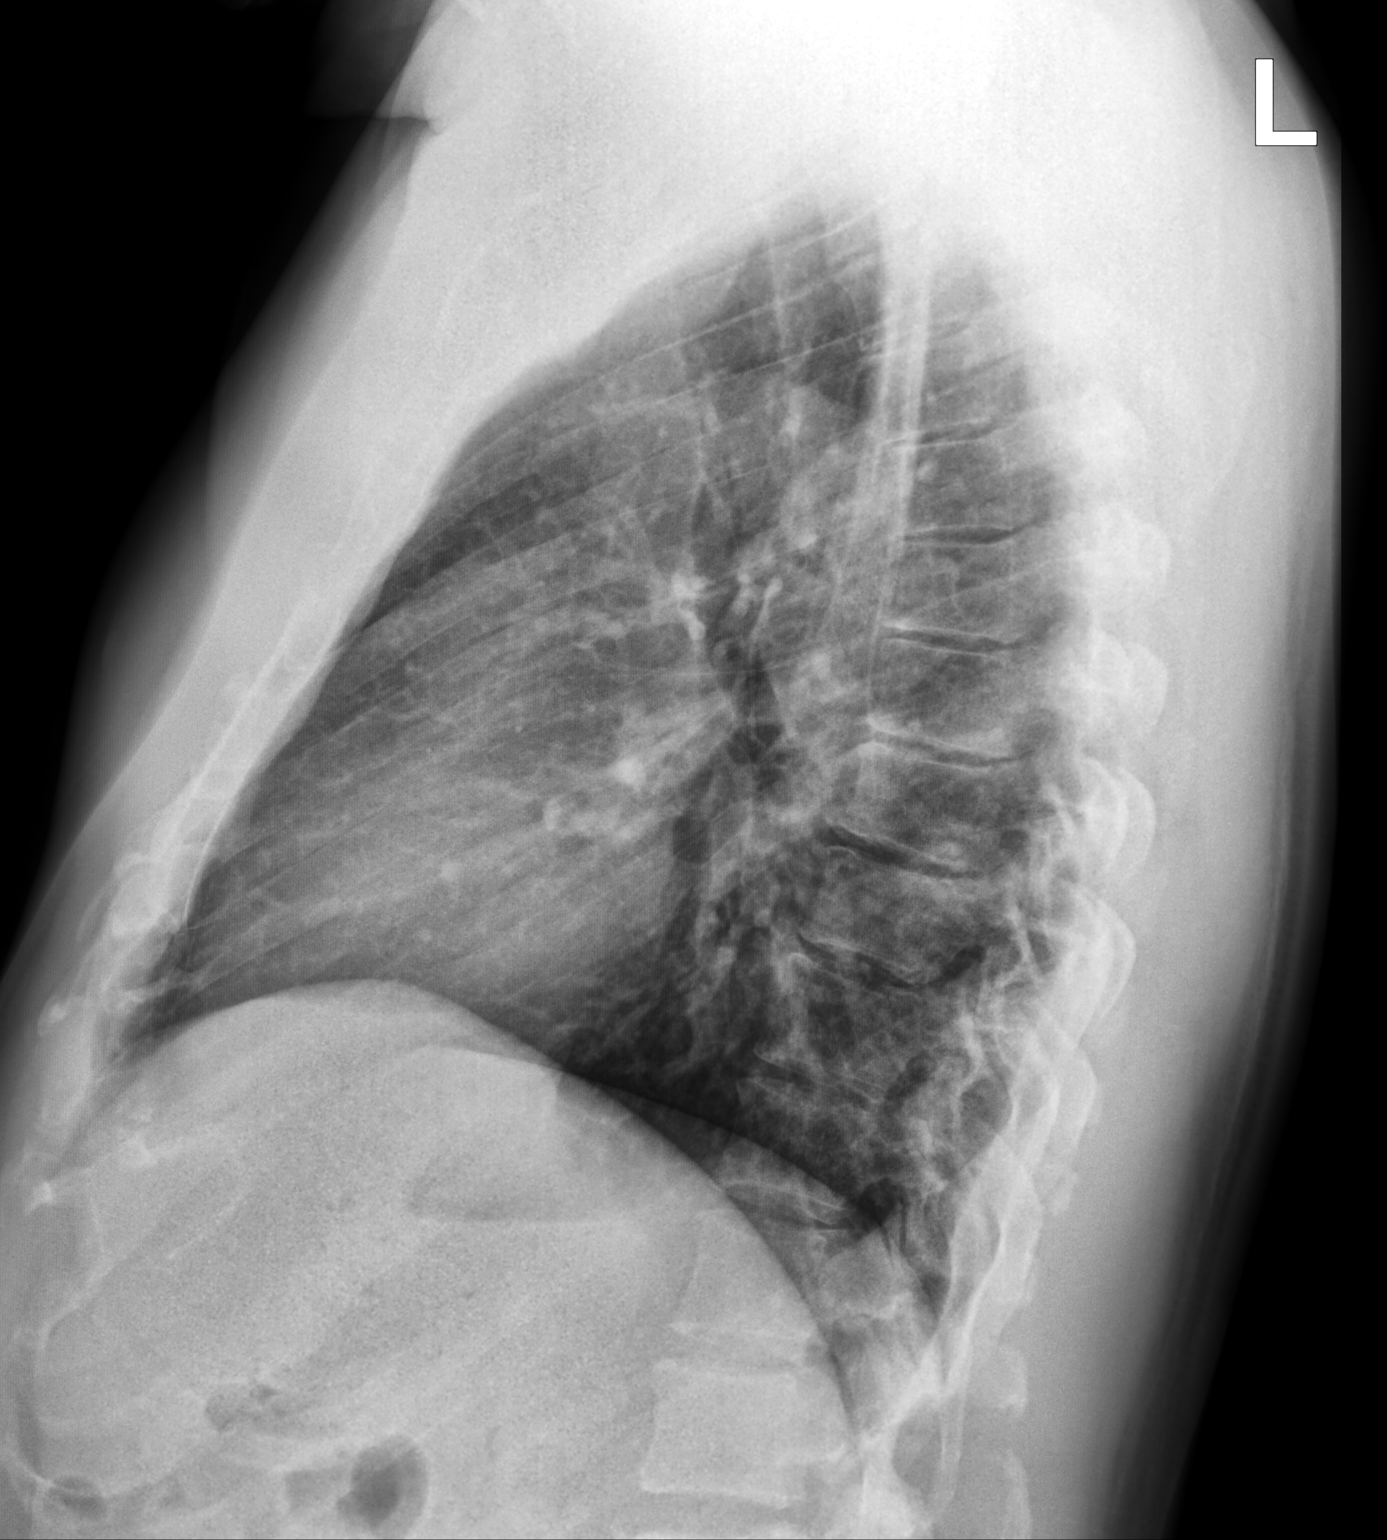

[2 of 2 positions shown; findings below may reference images not displayed]

FINDINGS: The cardiomediastinal silhouette is within normal limits. No
airspace consolidation, edema, pleural effusion, or pneumothorax is
identified. No acute osseous abnormality is seen.
IMPRESSION: No active cardiopulmonary disease.

## 2023-01-24 ENCOUNTER — Other Ambulatory Visit: Payer: Self-pay | Admitting: Family Medicine

## 2023-01-24 DIAGNOSIS — I10 Essential (primary) hypertension: Secondary | ICD-10-CM

## 2023-01-26 ENCOUNTER — Ambulatory Visit (INDEPENDENT_AMBULATORY_CARE_PROVIDER_SITE_OTHER): Payer: 59

## 2023-01-26 DIAGNOSIS — R7989 Other specified abnormal findings of blood chemistry: Secondary | ICD-10-CM

## 2023-01-26 NOTE — Progress Notes (Signed)
Per orders of Dr. Casimiro Needle, injection of Testosterone  given by Vickii Chafe. Patient tolerated injection well.

## 2023-02-09 ENCOUNTER — Ambulatory Visit: Payer: 59

## 2023-02-09 DIAGNOSIS — R7989 Other specified abnormal findings of blood chemistry: Secondary | ICD-10-CM | POA: Diagnosis not present

## 2023-02-09 MED ORDER — TESTOSTERONE CYPIONATE 200 MG/ML IM SOLN
200.0000 mg | Freq: Once | INTRAMUSCULAR | Status: AC
  Administered 2023-02-09: 200 mg via INTRAMUSCULAR

## 2023-02-09 NOTE — Progress Notes (Signed)
Per orders of Dr. Casimiro Needle, injection of Testosterone  given by Stann Ore. Patient tolerated injection well.

## 2023-02-15 ENCOUNTER — Ambulatory Visit: Payer: 59 | Admitting: Family Medicine

## 2023-02-15 ENCOUNTER — Encounter: Payer: Self-pay | Admitting: Family Medicine

## 2023-02-15 ENCOUNTER — Telehealth: Payer: Self-pay | Admitting: *Deleted

## 2023-02-15 VITALS — BP 100/72 | HR 70 | Temp 98.9°F | Ht 71.0 in | Wt 220.6 lb

## 2023-02-15 DIAGNOSIS — E119 Type 2 diabetes mellitus without complications: Secondary | ICD-10-CM | POA: Diagnosis not present

## 2023-02-15 DIAGNOSIS — Z125 Encounter for screening for malignant neoplasm of prostate: Secondary | ICD-10-CM

## 2023-02-15 DIAGNOSIS — M5416 Radiculopathy, lumbar region: Secondary | ICD-10-CM | POA: Diagnosis not present

## 2023-02-15 DIAGNOSIS — R7989 Other specified abnormal findings of blood chemistry: Secondary | ICD-10-CM

## 2023-02-15 DIAGNOSIS — Z7984 Long term (current) use of oral hypoglycemic drugs: Secondary | ICD-10-CM

## 2023-02-15 DIAGNOSIS — E039 Hypothyroidism, unspecified: Secondary | ICD-10-CM

## 2023-02-15 LAB — POCT GLYCOSYLATED HEMOGLOBIN (HGB A1C): Hemoglobin A1C: 5.3 % (ref 4.0–5.6)

## 2023-02-15 MED ORDER — CYCLOBENZAPRINE HCL 10 MG PO TABS
10.0000 mg | ORAL_TABLET | Freq: Three times a day (TID) | ORAL | 5 refills | Status: DC | PRN
Start: 2023-02-15 — End: 2023-11-30

## 2023-02-15 MED ORDER — ROSUVASTATIN CALCIUM 5 MG PO TABS: 5.0000 mg | ORAL_TABLET | Freq: Every day | ORAL | 1 refills | Status: DC

## 2023-02-15 MED ORDER — HYDROCODONE-ACETAMINOPHEN 7.5-325 MG PO TABS: 1.0000 | ORAL_TABLET | Freq: Four times a day (QID) | ORAL | 0 refills | Status: DC | PRN

## 2023-02-15 MED ORDER — LEVOTHYROXINE SODIUM 50 MCG PO TABS
ORAL_TABLET | ORAL | 3 refills | Status: DC
Start: 2023-02-15 — End: 2023-11-30

## 2023-02-15 NOTE — Progress Notes (Signed)
Established Patient Office Visit  Subjective   Patient ID: Walter Jordan., male    DOB: 29-Jan-1966  Age: 57 y.o. MRN: [id number]  Chief Complaint  Patient presents with   Medical Management of Chronic Issues    Pt is here for follow up today for medication refills.   Low T-- pt reports good control of his symptoms with the testosterone injections. He denies any side effects to the medication.  DM-- is taking his medication as prescribed. He denies any vision changes, no polyuria. Foot exam performed today.   Hypothyroidism-- pt reports compliance with meds, needs new TSH today and refills. Denies any constipation, weight gain, no new symptoms.   Lumbar radiculopathy-- continues to take norco 5/325 mg BID PRN, PDMP reviewed today. Continues to see orthopedics for ongoing chronic pain managmenet.     Current Outpatient Medications  Medication Instructions   alfuzosin (UROXATRAL) 10 mg, Oral, Daily   aspirin EC 81 mg, Oral, Daily, Swallow whole.   cyclobenzaprine (FLEXERIL) 10 mg, Oral, 3 times daily PRN   empagliflozin (JARDIANCE) 10 MG TABS tablet TAKE 1 TABLET BY MOUTH EVERY DAY BEFORE BREAKFAST   HYDROcodone-acetaminophen (NORCO) 7.5-325 MG tablet 1 tablet, Oral, Every 6 hours PRN   levothyroxine (SYNTHROID) 50 MCG tablet TAKE 1 TABLET BY MOUTH EVERY DAY BEFORE BREAKFAST   losartan (COZAAR) 25 mg, Oral, Daily   Multiple Vitamins-Minerals (MEGA MULTIVITAMIN FOR MEN PO) Oral, Daily,     rosuvastatin (CRESTOR) 5 mg, Oral, Daily   SYRINGE-NEEDLE, DISP, 3 ML (B-D 3CC LUER-LOK SYR 23GX1") 23G X 1" 3 ML MISC Inject 200 mg testosterone every 2 weeks   testosterone cypionate (DEPOTESTOSTERONE CYPIONATE) 200 mg, Intramuscular, Every 14 days    Patient Active Problem List   Diagnosis Date Noted   Cervicalgia 01/06/2022   Nonallopathic lesion of lumbar region 07/28/2020   Nonallopathic lesion of sacral region 07/28/2020   Nonallopathic lesion of thoracic region 07/28/2020    Impaired glucose tolerance 12/04/2019   Low testosterone in male 06/11/2019   Controlled type 2 diabetes mellitus without complication, without long-term current use of insulin (HCC) 06/11/2019   Pain in left hand 03/28/2019   Hx of adenomatous colonic polyps 10/25/2017   Lumbar radiculopathy 05/11/2016   VERTIGO 02/22/2010   Obstructive sleep apnea 09/27/2009   ERECTILE DYSFUNCTION, ORGANIC 09/07/2009   CHEST PAIN 03/13/2008   OTHER SYMPTOMS INVOLVING DIGESTIVE SYSTEM OTHER 03/12/2008   RECTAL BLEEDING, HX OF 03/12/2008   Hypothyroidism 02/22/2007   Allergic rhinitis 02/22/2007   GERD 02/22/2007   LOW BACK PAIN 02/22/2007      Review of Systems  All other systems reviewed and are negative.     Objective:     BP 100/72 (BP Location: Left Arm, Patient Position: Sitting, Cuff Size: Large)   Pulse 70   Temp 98.9 F (37.2 C) (Oral)   Ht 5\' 11"  (1.803 m)   Wt 220 lb 9.6 oz (100.1 kg)   SpO2 99%   BMI 30.77 kg/m    Physical Exam Vitals reviewed.  Constitutional:      Appearance: Normal appearance. He is well-groomed and normal weight.  Eyes:     Extraocular Movements: Extraocular movements intact.     Conjunctiva/sclera: Conjunctivae normal.  Neck:     Thyroid: No thyromegaly.  Cardiovascular:     Rate and Rhythm: Normal rate and regular rhythm.     Heart sounds: S1 normal and S2 normal. No murmur heard. Pulmonary:     Effort: Pulmonary effort  is normal.     Breath sounds: Normal breath sounds and air entry. No rales.  Abdominal:     General: Abdomen is flat. Bowel sounds are normal.  Musculoskeletal:     Right lower leg: No edema.     Left lower leg: No edema.  Neurological:     General: No focal deficit present.     Mental Status: He is alert and oriented to person, place, and time.     Gait: Gait is intact.  Psychiatric:        Mood and Affect: Mood and affect normal.   Last hemoglobin A1c Lab Results  Component Value Date   HGBA1C 5.3 02/15/2023       The 10-year ASCVD risk score (Arnett DK, et al., 2019) is: 12.9%    Assessment & Plan:  Controlled type 2 diabetes mellitus without complication, without long-term current use of insulin (HCC) Assessment & Plan: Currently on crestor 5 mg daily, jardiance 10 mg daily was refilled today. A1C done today and is 5.3 which is very well controlled. Will continue the medication as prescribed. He is due for his annual blood work today for surveillance.   Orders: -     POCT glycosylated hemoglobin (Hb A1C) -     Comprehensive metabolic panel -     Lipid panel -     Microalbumin / creatinine urine ratio -     Rosuvastatin Calcium; Take 1 tablet (5 mg total) by mouth daily.  Dispense: 90 tablet; Refill: 1  Low testosterone in male Assessment & Plan: Needs CBC for surveillance today. PDMP reviewed. He is taking about 2 per day, has been consistently taking them for the past 2 months or so, which is more than he has required in the past.   Orders: -     CBC with Differential/Platelet  Prostate cancer screening -     PSA  Lumbar radiculopathy Assessment & Plan: Refilled 10 mg cyclobenzaprine TID PRN and his norco today, he continues to follow with orthopedics for his chronic pain, getting injections,etc.   Orders: -     HYDROcodone-Acetaminophen; Take 1 tablet by mouth every 6 (six) hours as needed for moderate pain.  Dispense: 60 tablet; Refill: 0 -     Cyclobenzaprine HCl; Take 1 tablet (10 mg total) by mouth 3 (three) times daily as needed for muscle spasms.  Dispense: 30 tablet; Refill: 5  Hypothyroidism, unspecified type -     Levothyroxine Sodium; TAKE 1 TABLET BY MOUTH EVERY DAY BEFORE BREAKFAST  Dispense: 90 tablet; Refill: 3 -     TSH; Future     Return in about 3 months (around 05/07/2023) for annual physical exam.    Karie Georges, MD

## 2023-02-15 NOTE — Telephone Encounter (Signed)
Lab add-on sheet completed and faxed to the [facility] lab at [phone].

## 2023-02-15 NOTE — Telephone Encounter (Signed)
-----   Message from Karie Georges sent at 02/15/2023  4:51 PM EDT ----- Regarding: add TSH to his labs Hey! I forgot to add a TSH to his labs-- when the lab person gets here in the morning can you double check with her that a TSH can be added to what was drawn? Thanks!

## 2023-02-16 ENCOUNTER — Ambulatory Visit (INDEPENDENT_AMBULATORY_CARE_PROVIDER_SITE_OTHER): Payer: 59

## 2023-02-16 DIAGNOSIS — E039 Hypothyroidism, unspecified: Secondary | ICD-10-CM | POA: Diagnosis not present

## 2023-02-16 LAB — CBC WITH DIFFERENTIAL/PLATELET
Basophils Absolute: 0 10*3/uL (ref 0.0–0.1)
Basophils Relative: 0.9 % (ref 0.0–3.0)
Eosinophils Absolute: 0.1 10*3/uL (ref 0.0–0.7)
Eosinophils Relative: 2.4 % (ref 0.0–5.0)
HCT: 49.3 % (ref 39.0–52.0)
Hemoglobin: 16.1 g/dL (ref 13.0–17.0)
Lymphocytes Relative: 34.9 % (ref 12.0–46.0)
Lymphs Abs: 1.9 10*3/uL (ref 0.7–4.0)
MCHC: 32.7 g/dL (ref 30.0–36.0)
MCV: 97.3 fL (ref 78.0–100.0)
Monocytes Absolute: 0.8 10*3/uL (ref 0.1–1.0)
Monocytes Relative: 14.5 % — ABNORMAL HIGH (ref 3.0–12.0)
Neutro Abs: 2.6 10*3/uL (ref 1.4–7.7)
Neutrophils Relative %: 47.3 % (ref 43.0–77.0)
Platelets: 222 10*3/uL (ref 150.0–400.0)
RBC: 5.07 Mil/uL (ref 4.22–5.81)
RDW: 13.9 % (ref 11.5–15.5)
WBC: 5.4 10*3/uL (ref 4.0–10.5)

## 2023-02-16 LAB — COMPREHENSIVE METABOLIC PANEL
ALT: 29 U/L (ref 0–53)
AST: 40 U/L — ABNORMAL HIGH (ref 0–37)
Albumin: 4.6 g/dL (ref 3.5–5.2)
Alkaline Phosphatase: 61 U/L (ref 39–117)
BUN: 11 mg/dL (ref 6–23)
CO2: 28 meq/L (ref 19–32)
Calcium: 9.5 mg/dL (ref 8.4–10.5)
Chloride: 102 meq/L (ref 96–112)
Creatinine, Ser: 1.34 mg/dL (ref 0.40–1.50)
GFR: 58.78 mL/min — ABNORMAL LOW (ref >60.00)
Glucose, Bld: 81 mg/dL (ref 70–99)
Potassium: 4.1 meq/L (ref 3.5–5.1)
Sodium: 139 meq/L (ref 135–145)
Total Bilirubin: 1.3 mg/dL — ABNORMAL HIGH (ref 0.2–1.2)
Total Protein: 7.3 g/dL (ref 6.0–8.3)

## 2023-02-16 LAB — LIPID PANEL
Cholesterol: 148 mg/dL (ref 0–200)
HDL: 49.7 mg/dL (ref >39.00)
LDL Cholesterol: 85 mg/dL (ref 0–99)
NonHDL: 98.61
Total CHOL/HDL Ratio: 3
Triglycerides: 66 mg/dL (ref 0.0–149.0)
VLDL: 13.2 mg/dL (ref 0.0–40.0)

## 2023-02-16 LAB — MICROALBUMIN / CREATININE URINE RATIO
Creatinine,U: 51.2 mg/dL
Microalb Creat Ratio: 1.4 mg/g (ref 0.0–30.0)
Microalb, Ur: <0.7 mg/dL (ref 0.0–1.9)

## 2023-02-16 LAB — TSH: TSH: 4.54 u[IU]/mL (ref 0.35–5.50)

## 2023-02-16 LAB — PSA: PSA: 2.25 ng/mL (ref 0.10–4.00)

## 2023-02-19 NOTE — Assessment & Plan Note (Signed)
Refilled 10 mg cyclobenzaprine TID PRN and his norco today, he continues to follow with orthopedics for his chronic pain, getting injections,etc.

## 2023-02-19 NOTE — Assessment & Plan Note (Addendum)
Needs CBC for surveillance today. PDMP reviewed. He is taking about 2 per day, has been consistently taking them for the past 2 months or so, which is more than he has required in the past.

## 2023-02-19 NOTE — Assessment & Plan Note (Signed)
Currently on crestor 5 mg daily, jardiance 10 mg daily was refilled today. A1C done today and is 5.3 which is very well controlled. Will continue the medication as prescribed. He is due for his annual blood work today for surveillance.

## 2023-02-22 ENCOUNTER — Other Ambulatory Visit: Payer: Self-pay | Admitting: Family Medicine

## 2023-02-22 DIAGNOSIS — R7989 Other specified abnormal findings of blood chemistry: Secondary | ICD-10-CM

## 2023-03-06 ENCOUNTER — Other Ambulatory Visit: Payer: Self-pay | Admitting: Family Medicine

## 2023-03-06 DIAGNOSIS — R7989 Other specified abnormal findings of blood chemistry: Secondary | ICD-10-CM

## 2023-03-06 MED ORDER — TESTOSTERONE CYPIONATE 200 MG/ML IM SOLN
200.0000 mg | INTRAMUSCULAR | 2 refills | Status: DC
Start: 2023-03-06 — End: 2023-05-07

## 2023-03-06 NOTE — Telephone Encounter (Signed)
Pt checking on progress of refill, says he has been waiting over a week

## 2023-03-09 ENCOUNTER — Ambulatory Visit (INDEPENDENT_AMBULATORY_CARE_PROVIDER_SITE_OTHER): Payer: 59 | Admitting: *Deleted

## 2023-03-09 DIAGNOSIS — R7989 Other specified abnormal findings of blood chemistry: Secondary | ICD-10-CM | POA: Diagnosis not present

## 2023-03-09 MED ORDER — TESTOSTERONE CYPIONATE 200 MG/ML IM SOLN
200.0000 mg | Freq: Once | INTRAMUSCULAR | Status: AC
Start: 2023-03-09 — End: 2023-03-09
  Administered 2023-03-09: 200 mg via INTRAMUSCULAR

## 2023-03-09 NOTE — Progress Notes (Signed)
Per orders of Dr. Legrand Como, injection of Testosterone cypionate '200mg'$  given by Lahoma Crocker A. Patient tolerated injection well.

## 2023-03-23 ENCOUNTER — Ambulatory Visit (INDEPENDENT_AMBULATORY_CARE_PROVIDER_SITE_OTHER): Payer: 59 | Admitting: *Deleted

## 2023-03-23 DIAGNOSIS — R7989 Other specified abnormal findings of blood chemistry: Secondary | ICD-10-CM

## 2023-03-23 MED ORDER — TESTOSTERONE CYPIONATE 200 MG/ML IM SOLN
200.0000 mg | Freq: Once | INTRAMUSCULAR | Status: AC
Start: 2023-03-23 — End: 2023-03-23
  Administered 2023-03-23: 200 mg via INTRAMUSCULAR

## 2023-03-23 NOTE — Progress Notes (Addendum)
Per orders of Dr. Caryl Never, injection of Testosterone cypionate 200mg  given by Johnella Moloney. Patient tolerated injection well. Addendum: Injection was "patient supplied" for the Testosterone cypionate 200mg  given on 03/23/2023 and I am unable to enter this in the [facility] as of 06/07/2023 as it does not show for correction.

## 2023-04-06 ENCOUNTER — Ambulatory Visit (INDEPENDENT_AMBULATORY_CARE_PROVIDER_SITE_OTHER): Payer: 59 | Admitting: *Deleted

## 2023-04-06 DIAGNOSIS — R7989 Other specified abnormal findings of blood chemistry: Secondary | ICD-10-CM | POA: Diagnosis not present

## 2023-04-06 MED ORDER — TESTOSTERONE CYPIONATE 200 MG/ML IM SOLN
200.0000 mg | Freq: Once | INTRAMUSCULAR | Status: AC
  Administered 2023-04-06: 200 mg via INTRAMUSCULAR

## 2023-04-06 NOTE — Progress Notes (Signed)
Per orders of Dr. Legrand Como, injection of Testosterone cypionate '200mg'$  given by Lahoma Crocker A. Patient tolerated injection well.

## 2023-04-16 ENCOUNTER — Encounter: Payer: Self-pay | Admitting: Family Medicine

## 2023-04-16 NOTE — Telephone Encounter (Signed)
Yes that's fine 

## 2023-04-18 ENCOUNTER — Ambulatory Visit (INDEPENDENT_AMBULATORY_CARE_PROVIDER_SITE_OTHER): Payer: 59 | Admitting: *Deleted

## 2023-04-18 DIAGNOSIS — R7989 Other specified abnormal findings of blood chemistry: Secondary | ICD-10-CM | POA: Diagnosis not present

## 2023-04-18 MED ORDER — TESTOSTERONE CYPIONATE 200 MG/ML IM SOLN
200.0000 mg | Freq: Once | INTRAMUSCULAR | Status: AC
  Administered 2023-04-18: 200 mg via INTRAMUSCULAR

## 2023-04-18 NOTE — Progress Notes (Signed)
Per orders of Dr. Casimiro Needle, injection of Testosterone cypionate 200mg  given by Sallee Lange A. Patient tolerated injection well.

## 2023-04-22 ENCOUNTER — Other Ambulatory Visit: Payer: Self-pay | Admitting: Family Medicine

## 2023-04-22 DIAGNOSIS — I10 Essential (primary) hypertension: Secondary | ICD-10-CM

## 2023-04-23 ENCOUNTER — Ambulatory Visit: Payer: 59

## 2023-04-24 ENCOUNTER — Encounter: Payer: Self-pay | Admitting: Sports Medicine

## 2023-04-24 ENCOUNTER — Other Ambulatory Visit: Payer: Self-pay

## 2023-04-24 ENCOUNTER — Ambulatory Visit (INDEPENDENT_AMBULATORY_CARE_PROVIDER_SITE_OTHER): Payer: 59 | Admitting: Sports Medicine

## 2023-04-24 ENCOUNTER — Ambulatory Visit: Payer: 59 | Admitting: Orthopaedic Surgery

## 2023-04-24 DIAGNOSIS — M25511 Pain in right shoulder: Secondary | ICD-10-CM | POA: Diagnosis not present

## 2023-04-24 DIAGNOSIS — G8929 Other chronic pain: Secondary | ICD-10-CM | POA: Diagnosis not present

## 2023-04-24 DIAGNOSIS — M25512 Pain in left shoulder: Secondary | ICD-10-CM | POA: Diagnosis not present

## 2023-04-24 MED ORDER — PREDNISONE 10 MG (21) PO TBPK: ORAL_TABLET | ORAL | 3 refills | Status: DC

## 2023-04-24 MED ORDER — LIDOCAINE HCL 1 % IJ SOLN
2.0000 mL | INTRAMUSCULAR | Status: AC | PRN
  Administered 2023-04-24: 2 mL

## 2023-04-24 MED ORDER — BUPIVACAINE HCL 0.25 % IJ SOLN
2.0000 mL | INTRAMUSCULAR | Status: AC | PRN
  Administered 2023-04-24: 2 mL via INTRA_ARTICULAR

## 2023-04-24 MED ORDER — METHYLPREDNISOLONE ACETATE 80 MG/ML IJ SUSP
2.0000 mL | INTRAMUSCULAR | Status: AC | PRN
  Administered 2023-04-24: 2 mL via INTRA_ARTICULAR

## 2023-04-24 NOTE — Progress Notes (Signed)
   Procedure Note  Patient: Walter Jordan.             Date of Birth: 07/12/1965           MRN: [id number]             Visit Date: 04/24/2023  Procedures: Visit Diagnoses:  1. Chronic right shoulder pain    Large Joint Inj: R glenohumeral on 04/24/2023 9:19 AM Indications: pain Details: 22 G 3.5 in needle, ultrasound-guided Medications: 2 mL lidocaine 1 %; 2 mL bupivacaine 0.25 %; 2 mL methylPREDNISolone acetate 80 MG/ML  US-guided glenohumeral joint injection, Right shoulder After discussion on risks/benefits/indications, informed verbal consent was obtained. A timeout was then performed. The patient was positioned lying lateral recumbent on examination table. The patient's shoulder was prepped with betadine and multiple alcohol swabs and utilizing ultrasound guidance, the patient's glenohumeral joint was identified on ultrasound. Using ultrasound guidance a 22-gauge, 3.5 inch needle with a mixture of 2:2:2 cc's lidocaine:bupivicaine:depomedrol was directed from a lateral to medial direction via in-plane technique into the glenohumeral joint with visualization of appropriate spread of injectate into the joint. Patient tolerated the procedure well without immediate complications. Injection performed under supervision by Dr. Benjiman Core.      Procedure, treatment alternatives, risks and benefits explained, specific risks discussed. Consent was given by the patient. Patient was prepped and draped in the usual sterile fashion.    - patient tolerated well without immediate AE's - follow-up with Dr. Roda Shutters as indicated; I am happy to see them as needed  Madelyn Brunner, DO Primary Care Sports Medicine Physician  [facility] Orthopedics  This note was dictated using Dragon naturally speaking software and may contain errors in syntax, spelling, or content which have not been identified prior to signing this note.

## 2023-04-24 NOTE — Progress Notes (Signed)
Office Visit Note   Patient: Walter Jordan.           Date of Birth: Jul 10, 1965           MRN: [id number] Visit Date: 04/24/2023              Requested by: Karie Georges, MD [address] [place],  [place] [address] PCP: Karie Georges, MD   Assessment & Plan: Visit Diagnoses:  1. Chronic pain of both shoulders     Plan: Braedin is a 57 year old gentleman with right greater than left shoulder pain.  Impression is overuse and tendinosis possibly bursitis as well.  Had a prior MRI of the right shoulder in 2016 which showed moderate tendinosis and unfavorable acromial anatomy.  We saw about 6 months ago for the left shoulder and glenohumeral injection which really helped.  He would like to try prednisone Dosepak for the left shoulder and a glenohumeral injection for the right shoulder.  Home exercises were also provided.  He will follow-up if symptoms do not improve.  Follow-Up Instructions: No follow-ups on file.   Orders:  No orders of the defined types were placed in this encounter.  Meds ordered this encounter  Medications   predniSONE (STERAPRED UNI-PAK 21 TAB) 10 MG (21) TBPK tablet    Sig: Take as directed    Dispense:  21 tablet    Refill:  3      Procedures: No procedures performed   Clinical Data: No additional findings.   Subjective: Chief Complaint  Patient presents with   Right Shoulder - Pain   Left Shoulder - Pain    HPI Mr. Wixon comes in for bilateral shoulder pain worse on the right.  Has pain when he sleeps on the right side.  Is been going on for few months.  Denies any radicular symptoms.  Left shoulder has felt better since he got the glenohumeral injection earlier this year. Review of Systems  Constitutional: Negative.   HENT: Negative.    Eyes: Negative.   Respiratory: Negative.    Cardiovascular: Negative.   Gastrointestinal: Negative.   Endocrine: Negative.   Genitourinary: Negative.   Skin: Negative.    Allergic/Immunologic: Negative.   Neurological: Negative.   Hematological: Negative.   Psychiatric/Behavioral: Negative.    All other systems reviewed and are negative.    Objective: Vital Signs: There were no vitals taken for this visit.  Physical Exam Vitals and nursing note reviewed.  Constitutional:      Appearance: He is well-developed.  Pulmonary:     Effort: Pulmonary effort is normal.  Abdominal:     Palpations: Abdomen is soft.  Skin:    General: Skin is warm.  Neurological:     Mental Status: He is alert and oriented to person, place, and time.  Psychiatric:        Behavior: Behavior normal.        Thought Content: Thought content normal.        Judgment: Judgment normal.     Ortho Exam Exam of right shoulder shows full active and passive range of motion.  Decent strength to manual muscle testing of the rotator cuff.  Slightly positive impingement sign.  No significant tenderness to the bicipital groove. Specialty Comments:  MRI LUMBAR SPINE WITHOUT CONTRAST     TECHNIQUE:  Multiplanar, multisequence MR imaging of the lumbar spine was  performed. No intravenous contrast was administered.     COMPARISON:  Previous MRI from 07/28/2014.  FINDINGS:  Segmentation: Standard. Lowest well-formed disc space labeled the  L5-S1 level.     Alignment: 4 mm retrolisthesis of L5 on S1, with trace  retrolisthesis of L2 on L3. Trace anterolisthesis of L3 on L4.  Findings chronic and facet mediated. Straightening of the normal  lumbar lordosis with underlying mild dextroscoliosis.     Vertebrae: Vertebral body height maintained without evidence acute  or chronic fracture. Bone marrow signal intensity somewhat  heterogeneous but within normal limits. No discrete or worrisome  osseous lesions. Discogenic reactive endplate changes seen about the  left aspect of the L4-5 interspace and right aspect of the L5-S1  interspace. No other abnormal marrow edema.     Conus  medullaris and cauda equina: Conus extends to the L1 level.  Conus and cauda equina appear normal.     Paraspinal and other soft tissues: Paraspinous soft tissues  demonstrate no acute finding. T2 hyperintense simple cyst partially  visualize within the interpolar left kidney. Visualized visceral  structures otherwise unremarkable.     Disc levels:     L1-2: Negative interspace. Mild facet hypertrophy. No significant  canal or foraminal stenosis.     L2-3: Chronic intervertebral disc space narrowing with diffuse disc  bulge. Superimposed mild reactive endplate changes. Moderate facet  hypertrophy. Prominence of the dorsal epidural fat. Changes  resultant moderate canal with bilateral lateral recess stenosis,  greater on the left. Mild bilateral L2 foraminal narrowing.     L3-4: Chronic intervertebral disc space narrowing with mild diffuse  disc bulge. There is a superimposed broad right extraforaminal disc  protrusion, closely approximating the exiting right L3 nerve root  (series 13, image 23). Moderate bilateral facet hypertrophy.  Resultant mild spinal stenosis. Mild to moderate bilateral L3  foraminal narrowing, slightly worse on the right.     L4-5: Chronic intervertebral disc space narrowing with diffuse disc  bulge and disc desiccation. Disc bulging asymmetric to the left with  exuberant left-sided endplate osteophytic spurring. Superimposed  moderate left worse than right facet hypertrophy. Trace joint  effusion on the right. Resultant severe left foraminal stenosis with  probable impingement of the left L4 nerve root. Moderate to severe  left lateral recess stenosis. Central canal remains patent. Mild  right foraminal narrowing noted.     L5-S1: Advanced chronic intervertebral disc space narrowing with  diffuse disc bulge and disc desiccation. Extensive reactive endplate  changes with marginal endplate osteophytic spurring. Mild bilateral  facet hypertrophy with  associated trace joint effusions. Broad-based  posterior disc osteophyte contacts the descending S1 nerve roots  bilaterally without frank neural impingement or displacement.  Moderate to severe bilateral L5 foraminal stenosis.     IMPRESSION:  1. Left eccentric disc osteophyte and facet hypertrophy at L4-5 with  resultant severe left foraminal stenosis, with moderate to severe  left lateral recess narrowing. Either the left L4 or descending L5  nerve roots could be affected.  2. Chronic degenerative disc osteophyte at L5-S1, contacting the  descending S1 nerve roots bilaterally without frank neural  impingement or displacement. Moderate to severe bilateral L5  foraminal stenosis at this level.  3. Right extraforaminal disc protrusion at L3-4, potentially  affecting the exiting right L3 nerve root.  4. Disc bulge with facet hypertrophy at L2-3 with resultant moderate  spinal stenosis.  5. Overall, these changes are progressed relative to 2016.        Electronically Signed    By: Rise Mu M.D.    On: 08/17/2019 08:22  Imaging: No results found.   PMFS History: Patient Active Problem List   Diagnosis Date Noted   Cervicalgia 01/06/2022   Nonallopathic lesion of lumbar region 07/28/2020   Nonallopathic lesion of sacral region 07/28/2020   Nonallopathic lesion of thoracic region 07/28/2020   Impaired glucose tolerance 12/04/2019   Low testosterone in male 06/11/2019   Controlled type 2 diabetes mellitus without complication, without long-term current use of insulin (HCC) 06/11/2019   Pain in left hand 03/28/2019   Hx of adenomatous colonic polyps 10/25/2017   Lumbar radiculopathy 05/11/2016   VERTIGO 02/22/2010   Obstructive sleep apnea 09/27/2009   ERECTILE DYSFUNCTION, ORGANIC 09/07/2009   CHEST PAIN 03/13/2008   OTHER SYMPTOMS INVOLVING DIGESTIVE SYSTEM OTHER 03/12/2008   RECTAL BLEEDING, HX OF 03/12/2008   Hypothyroidism 02/22/2007   Allergic rhinitis  02/22/2007   GERD 02/22/2007   LOW BACK PAIN 02/22/2007   Past Medical History:  Diagnosis Date   Allergic rhinitis    Allergy    Arthritis    back   ED (erectile dysfunction)    Elevated lipase    Enlarged prostate    GERD (gastroesophageal reflux disease)    Hepatic steatosis    Hypothyroidism    Low back pain    Low testosterone    Sleep apnea    had sleep study 09-10-16- needs new cpap    Family History  Problem Relation Age of Onset   Cancer Father        sarcoma- both kidneys removed   Diabetes Father        s/p renal transplant   Arthritis Mother    High blood pressure Mother    High Cholesterol Mother    Heart disease Mother    Diabetes Brother    Diabetes Maternal Grandmother    Diabetes Paternal Grandmother    Cancer Paternal Grandmother        colon   Depression Other    Diabetes Other    Hypertension Other    Kidney disease Other    Obesity Sister    Stroke Maternal Grandfather 60       tobacco abuse   Alcohol abuse Maternal Grandfather    Early death Paternal Grandfather        accidental   Prostate cancer Maternal Uncle    Heart attack Maternal Uncle 60   Prostate cancer Maternal Uncle    Cancer Paternal Uncle        uncertain type   Alcohol abuse Paternal Uncle    Colon polyps Neg Hx    Rectal cancer Neg Hx    Stomach cancer Neg Hx    Esophageal cancer Neg Hx    Pancreatic cancer Neg Hx     Past Surgical History:  Procedure Laterality Date   COLONOSCOPY  03/19/2008; 2018   ext. hems    HERNIA REPAIR     x2 1968, inguinal 1973   WISDOM TOOTH EXTRACTION     Social History   Occupational History   Not on file  Tobacco Use   Smoking status: Never   Smokeless tobacco: Never  Vaping Use   Vaping status: Never Used  Substance and Sexual Activity   Alcohol use: Yes    Comment: social   Drug use: No   Sexual activity: Not on file

## 2023-04-30 ENCOUNTER — Other Ambulatory Visit: Payer: Self-pay | Admitting: Family Medicine

## 2023-04-30 DIAGNOSIS — M5416 Radiculopathy, lumbar region: Secondary | ICD-10-CM

## 2023-05-01 MED ORDER — HYDROCODONE-ACETAMINOPHEN 7.5-325 MG PO TABS: 1.0000 | ORAL_TABLET | Freq: Four times a day (QID) | ORAL | 0 refills | Status: DC | PRN

## 2023-05-03 ENCOUNTER — Ambulatory Visit (INDEPENDENT_AMBULATORY_CARE_PROVIDER_SITE_OTHER): Payer: 59

## 2023-05-03 DIAGNOSIS — R7989 Other specified abnormal findings of blood chemistry: Secondary | ICD-10-CM | POA: Diagnosis not present

## 2023-05-03 MED ORDER — TESTOSTERONE CYPIONATE 200 MG/ML IM SOLN
200.0000 mg | INTRAMUSCULAR | Status: AC
  Administered 2023-05-03: 200 mg via INTRAMUSCULAR

## 2023-05-03 NOTE — Progress Notes (Signed)
 Per orders of Dr. Casimiro Needle, injection of Testosterone Cypionate 200 mg given by Braxen Dobek L Naryiah Schley. Patient tolerated injection well.

## 2023-05-07 ENCOUNTER — Ambulatory Visit (INDEPENDENT_AMBULATORY_CARE_PROVIDER_SITE_OTHER): Payer: 59 | Admitting: Family Medicine

## 2023-05-07 VITALS — BP 110/78 | HR 65 | Temp 98.7°F | Ht 70.5 in | Wt 224.8 lb

## 2023-05-07 DIAGNOSIS — E119 Type 2 diabetes mellitus without complications: Secondary | ICD-10-CM | POA: Diagnosis not present

## 2023-05-07 DIAGNOSIS — R7989 Other specified abnormal findings of blood chemistry: Secondary | ICD-10-CM | POA: Diagnosis not present

## 2023-05-07 DIAGNOSIS — Z7984 Long term (current) use of oral hypoglycemic drugs: Secondary | ICD-10-CM

## 2023-05-07 DIAGNOSIS — Z0000 Encounter for general adult medical examination without abnormal findings: Secondary | ICD-10-CM | POA: Diagnosis not present

## 2023-05-07 LAB — TESTOSTERONE: Testosterone: 502.93 ng/dL (ref 300.00–890.00)

## 2023-05-07 MED ORDER — EMPAGLIFLOZIN 10 MG PO TABS: 10.0000 mg | ORAL_TABLET | Freq: Every day | ORAL | 1 refills | Status: DC

## 2023-05-07 MED ORDER — TESTOSTERONE CYPIONATE 200 MG/ML IM SOLN: 200.0000 mg | INTRAMUSCULAR | 2 refills | Status: DC

## 2023-05-07 NOTE — Progress Notes (Signed)
Complete physical exam  Patient: Walter Jordan.   DOB: Jul 18, 1965   57 y.o. Male  MRN: [id number]  Subjective:    Chief Complaint  Patient presents with   Annual Exam    Walter Jordan. is a 57 y.o. male who presents today for a complete physical exam. He reports consuming a general diet. Exercise is limited by orthopedic condition(s): lumbar radiculopathy. He generally feels well. He reports sleeping pt has trouble sleeping at night, states that his legs are falling asleep, like he loses sensation in his legs at night. He does not have additional problems to discuss today.     Most recent fall risk assessment:     No data to display           Most recent depression screenings:    05/07/2023    8:19 AM 03/21/2022    8:35 AM  PHQ 2/9 Scores  PHQ - 2 Score 0 0  PHQ- 9 Score 2 3    Vision:is due for his eye appointment and Dental: No current dental problems, No regular dental care , and Last dental visit: 2 years ago  Patient Active Problem List   Diagnosis Date Noted   Cervicalgia 01/06/2022   Nonallopathic lesion of lumbar region 07/28/2020   Nonallopathic lesion of sacral region 07/28/2020   Nonallopathic lesion of thoracic region 07/28/2020   Impaired glucose tolerance 12/04/2019   Low testosterone in male 06/11/2019   Controlled type 2 diabetes mellitus without complication, without long-term current use of insulin (HCC) 06/11/2019   Pain in left hand 03/28/2019   Hx of adenomatous colonic polyps 10/25/2017   Lumbar radiculopathy 05/11/2016   VERTIGO 02/22/2010   Obstructive sleep apnea 09/27/2009   ERECTILE DYSFUNCTION, ORGANIC 09/07/2009   CHEST PAIN 03/13/2008   OTHER SYMPTOMS INVOLVING DIGESTIVE SYSTEM OTHER 03/12/2008   RECTAL BLEEDING, HX OF 03/12/2008   Hypothyroidism 02/22/2007   Allergic rhinitis 02/22/2007   GERD 02/22/2007   LOW BACK PAIN 02/22/2007      Patient Care Team: Karie Georges, MD as PCP - General (Family  Medicine) Jodelle Red, MD as PCP - Cardiology (Cardiology)   Outpatient Medications Prior to Visit  Medication Sig   alfuzosin (UROXATRAL) 10 MG 24 hr tablet Take 10 mg by mouth daily.   aspirin EC 81 MG tablet Take 1 tablet (81 mg total) by mouth daily. Swallow whole.   cyclobenzaprine (FLEXERIL) 10 MG tablet Take 1 tablet (10 mg total) by mouth 3 (three) times daily as needed for muscle spasms.   HYDROcodone-acetaminophen (NORCO) 7.5-325 MG tablet Take 1 tablet by mouth every 6 (six) hours as needed for moderate pain (pain score 4-6).   levothyroxine (SYNTHROID) 50 MCG tablet TAKE 1 TABLET BY MOUTH EVERY DAY BEFORE BREAKFAST   losartan (COZAAR) 25 MG tablet TAKE 1 TABLET (25 MG TOTAL) BY MOUTH DAILY.   Multiple Vitamins-Minerals (MEGA MULTIVITAMIN FOR MEN PO) Take by mouth daily.   predniSONE (STERAPRED UNI-PAK 21 TAB) 10 MG (21) TBPK tablet Take as directed   rosuvastatin (CRESTOR) 5 MG tablet Take 1 tablet (5 mg total) by mouth daily.   SYRINGE-NEEDLE, DISP, 3 ML (B-D 3CC LUER-LOK SYR 23GX1") 23G X 1" 3 ML MISC Inject 200 mg testosterone every 2 weeks   [DISCONTINUED] empagliflozin (JARDIANCE) 10 MG TABS tablet TAKE 1 TABLET BY MOUTH EVERY DAY BEFORE BREAKFAST   [DISCONTINUED] testosterone cypionate (DEPOTESTOSTERONE CYPIONATE) 200 MG/ML injection Inject 1 mL (200 mg total) into the muscle every 14 (  fourteen) days.   Facility-Administered Medications Prior to Visit  Medication Dose Route Frequency Provider   testosterone cypionate (DEPOTESTOSTERONE CYPIONATE) injection 200 mg  200 mg Intramuscular Q14 Days Karie Georges, MD   [DISCONTINUED] 0.9 %  sodium chloride infusion  500 mL Intravenous Continuous Danis, Andreas Blower, MD    Review of Systems  HENT:  Negative for hearing loss.   Eyes:  Negative for blurred vision.  Respiratory:  Negative for shortness of breath.   Cardiovascular:  Negative for chest pain.  Gastrointestinal: Negative.   Genitourinary: Negative.    Musculoskeletal:  Negative for back pain.  Neurological:  Negative for headaches.  Psychiatric/Behavioral:  Negative for depression.        Objective:     BP 110/78   Pulse 65   Temp 98.7 F (37.1 C) (Oral)   Ht 5' 10.5" (1.791 m)   Wt 224 lb 12.8 oz (102 kg)   SpO2 99%   BMI 31.80 kg/m    Physical Exam Vitals reviewed.  Constitutional:      Appearance: Normal appearance. He is well-groomed and normal weight.  HENT:     Right Ear: Tympanic membrane and ear canal normal.     Left Ear: Tympanic membrane and ear canal normal.     Mouth/Throat:     Mouth: Mucous membranes are moist.  Neck:     Thyroid: No thyromegaly.  Cardiovascular:     Rate and Rhythm: Normal rate and regular rhythm.     Heart sounds: S1 normal and S2 normal. No murmur heard. Pulmonary:     Effort: Pulmonary effort is normal.     Breath sounds: Normal breath sounds and air entry. No rales.  Abdominal:     General: Abdomen is flat. Bowel sounds are normal.  Musculoskeletal:     Right lower leg: No edema.     Left lower leg: No edema.  Neurological:     General: No focal deficit present.     Mental Status: He is alert and oriented to person, place, and time.     Gait: Gait is intact.  Psychiatric:        Mood and Affect: Mood and affect normal.        Behavior: Behavior normal.      No results found for any visits on 05/07/23.     Assessment & Plan:    Routine Health Maintenance and Physical Exam  Immunization History  Administered Date(s) Administered   Influenza Split 03/11/2012, 03/12/2013   Influenza,inj,Quad PF,6+ Mos 03/12/2020   Influenza-Unspecified 01/20/2014, 03/01/2016, 04/22/2019   PFIZER(Purple Top)SARS-COV-2 Vaccination 07/21/2019, 08/21/2019, 06/03/2020   Tdap 04/19/2016   Zoster Recombinant(Shingrix) 12/04/2019    Health Maintenance  Topic Date Due   Zoster Vaccines- Shingrix (2 of 2) 01/29/2020   OPHTHALMOLOGY EXAM  10/14/2022   COVID-19 Vaccine (4 - 2024-25  season) 05/23/2023 (Originally 01/21/2023)   INFLUENZA VACCINE  08/20/2023 (Originally 12/21/2022)   HIV Screening  11/13/2023 (Originally 07/25/1980)   HEMOGLOBIN A1C  08/15/2023   Colonoscopy  09/26/2023   Diabetic kidney evaluation - eGFR measurement  02/15/2024   Diabetic kidney evaluation - Urine ACR  02/15/2024   FOOT EXAM  02/15/2024   DTaP/Tdap/Td (2 - Td or Tdap) 04/19/2026   Hepatitis C Screening  Completed   HPV VACCINES  Aged Out    Discussed health benefits of physical activity, and encouraged him to engage in regular exercise appropriate for his age and condition.  Routine general medical examination at  a health care facility      -  Normal physical exam findings today, labs were done at the last visit, though he is due for a testosterone level today. Counseled patient on getting regular dental care and handouts were given on healthy eating and exercise, counseled patient also on possible acid reflux OTC treatments.   Controlled type 2 diabetes mellitus without complication, without long-term current use of insulin (HCC) -     Empagliflozin; Take 1 tablet (10 mg total) by mouth daily.  Dispense: 90 tablet; Refill: 1  Low testosterone in male -     Testosterone -     Testosterone Cypionate; Inject 1 mL (200 mg total) into the muscle every 14 (fourteen) days.  Dispense: 2 mL; Refill: 2    Return in 3 months (on 08/05/2023) for ok to be a video visit for medication refills.     Karie Georges, MD

## 2023-05-07 NOTE — Patient Instructions (Addendum)
Pepcid 20 mg twice a day or 40 mg once a day  Health Maintenance, Male Adopting a healthy lifestyle and getting preventive care are important in promoting health and wellness. Ask your health care provider about: The right schedule for you to have regular tests and exams. Things you can do on your own to prevent diseases and keep yourself healthy. What should I know about diet, weight, and exercise? Eat a healthy diet  Eat a diet that includes plenty of vegetables, fruits, low-fat dairy products, and lean protein. Do not eat a lot of foods that are high in solid fats, added sugars, or sodium. Maintain a healthy weight Body mass index (BMI) is a measurement that can be used to identify possible weight problems. It estimates body fat based on height and weight. Your health care provider can help determine your BMI and help you achieve or maintain a healthy weight. Get regular exercise Get regular exercise. This is one of the most important things you can do for your health. Most adults should: Exercise for at least 150 minutes each week. The exercise should increase your heart rate and make you sweat (moderate-intensity exercise). Do strengthening exercises at least twice a week. This is in addition to the moderate-intensity exercise. Spend less time sitting. Even light physical activity can be beneficial. Watch cholesterol and blood lipids Have your blood tested for lipids and cholesterol at 57 years of age, then have this test every 5 years. You may need to have your cholesterol levels checked more often if: Your lipid or cholesterol levels are high. You are older than 57 years of age. You are at high risk for heart disease. What should I know about cancer screening? Many types of cancers can be detected early and may often be prevented. Depending on your health history and family history, you may need to have cancer screening at various ages. This may include screening for: Colorectal  cancer. Prostate cancer. Skin cancer. Lung cancer. What should I know about heart disease, diabetes, and high blood pressure? Blood pressure and heart disease High blood pressure causes heart disease and increases the risk of stroke. This is more likely to develop in people who have high blood pressure readings or are overweight. Talk with your health care provider about your target blood pressure readings. Have your blood pressure checked: Every 3-5 years if you are 39-42 years of age. Every year if you are 25 years old or older. If you are between the ages of 32 and 65 and are a current or former smoker, ask your health care provider if you should have a one-time screening for abdominal aortic aneurysm (AAA). Diabetes Have regular diabetes screenings. This checks your fasting blood sugar level. Have the screening done: Once every three years after age 56 if you are at a normal weight and have a low risk for diabetes. More often and at a younger age if you are overweight or have a high risk for diabetes. What should I know about preventing infection? Hepatitis B If you have a higher risk for hepatitis B, you should be screened for this virus. Talk with your health care provider to find out if you are at risk for hepatitis B infection. Hepatitis C Blood testing is recommended for: Everyone born from [birth year] through 1965. Anyone with known risk factors for hepatitis C. Sexually transmitted infections (STIs) You should be screened each year for STIs, including gonorrhea and chlamydia, if: You are sexually active and are younger than 57  years of age. You are older than 57 years of age and your health care provider tells you that you are at risk for this type of infection. Your sexual activity has changed since you were last screened, and you are at increased risk for chlamydia or gonorrhea. Ask your health care provider if you are at risk. Ask your health care provider about whether you are at  high risk for HIV. Your health care provider may recommend a prescription medicine to help prevent HIV infection. If you choose to take medicine to prevent HIV, you should first get tested for HIV. You should then be tested every 3 months for as long as you are taking the medicine. Follow these instructions at home: Alcohol use Do not drink alcohol if your health care provider tells you not to drink. If you drink alcohol: Limit how much you have to 0-2 drinks a day. Know how much alcohol is in your drink. In the [place] one drink equals one 12 oz bottle of beer (355 mL), one 5 oz glass of wine (148 mL), or one 1 oz glass of hard liquor (44 mL). Lifestyle Do not use any products that contain nicotine or tobacco. These products include cigarettes, chewing tobacco, and vaping devices, such as e-cigarettes. If you need help quitting, ask your health care provider. Do not use street drugs. Do not share needles. Ask your health care provider for help if you need support or information about quitting drugs. General instructions Schedule regular health, dental, and eye exams. Stay current with your vaccines. Tell your health care provider if: You often feel depressed. You have ever been abused or do not feel safe at home. Summary Adopting a healthy lifestyle and getting preventive care are important in promoting health and wellness. Follow your health care provider's instructions about healthy diet, exercising, and getting tested or screened for diseases. Follow your health care provider's instructions on monitoring your cholesterol and blood pressure. This information is not intended to replace advice given to you by your health care provider. Make sure you discuss any questions you have with your health care provider. Document Revised: 09/27/2020 Document Reviewed: 09/27/2020 Elsevier Patient Education  2024 [employer].

## 2023-05-18 ENCOUNTER — Ambulatory Visit (INDEPENDENT_AMBULATORY_CARE_PROVIDER_SITE_OTHER): Payer: 59

## 2023-05-18 DIAGNOSIS — R7989 Other specified abnormal findings of blood chemistry: Secondary | ICD-10-CM

## 2023-05-18 MED ORDER — TESTOSTERONE CYPIONATE 200 MG/ML IM SOLN
200.0000 mg | INTRAMUSCULAR | Status: AC
  Administered 2023-05-18 – 2023-07-13 (×5): 200 mg via INTRAMUSCULAR

## 2023-05-18 NOTE — Progress Notes (Signed)
Per orders of Shirline Frees, NP , injection of testosterone given by Vickii Chafe on Right Ventrogluteal. Patient tolerated injection well.

## 2023-05-30 ENCOUNTER — Telehealth: Payer: Self-pay

## 2023-05-30 NOTE — Telephone Encounter (Signed)
 Copied from CRM [id number]. Topic: Appointments - Appointment Scheduling >> May 30, 2023  4:45 PM Tiffany H wrote: Patient called to schedule testosterone injection. [facility] for [facility] doesn't have guidance on testerone shots.

## 2023-05-31 ENCOUNTER — Other Ambulatory Visit: Payer: Self-pay | Admitting: Family Medicine

## 2023-05-31 DIAGNOSIS — R7989 Other specified abnormal findings of blood chemistry: Secondary | ICD-10-CM

## 2023-06-01 ENCOUNTER — Ambulatory Visit: Payer: 59

## 2023-06-01 ENCOUNTER — Telehealth: Payer: Self-pay

## 2023-06-01 MED ORDER — BD LUER-LOK SYRINGE 23G X 1" 3 ML MISC: 0 refills | Status: AC

## 2023-06-01 NOTE — Telephone Encounter (Signed)
 Pt has 2 refills on the script, will refuse

## 2023-06-01 NOTE — Telephone Encounter (Signed)
 Noted.

## 2023-06-01 NOTE — Telephone Encounter (Signed)
 Copied from CRM [id number]. Topic: Clinical - Prescription Issue >> Jun 01, 2023  8:41 AM Russell PARAS wrote: Reason for CRM: Patient had an appointment today to receive his Testerone injection; however, pharmacist did not receive approval in time from provider to give medication to patient. CB# [phone] [id number], rescheduled for 06/04/2023 at 10:15 AM

## 2023-06-03 ENCOUNTER — Other Ambulatory Visit: Payer: Self-pay | Admitting: Family Medicine

## 2023-06-03 DIAGNOSIS — R7989 Other specified abnormal findings of blood chemistry: Secondary | ICD-10-CM

## 2023-06-04 ENCOUNTER — Ambulatory Visit (INDEPENDENT_AMBULATORY_CARE_PROVIDER_SITE_OTHER): Payer: 59

## 2023-06-04 DIAGNOSIS — R7989 Other specified abnormal findings of blood chemistry: Secondary | ICD-10-CM

## 2023-06-04 NOTE — Progress Notes (Signed)
 Per orders of Dr. Casimiro Needle, injection of Testosterone given by Vickii Chafe Left Ventrogluteal.  Patient tolerated injection well.

## 2023-06-13 ENCOUNTER — Other Ambulatory Visit: Payer: Self-pay | Admitting: Family Medicine

## 2023-06-13 DIAGNOSIS — M5416 Radiculopathy, lumbar region: Secondary | ICD-10-CM

## 2023-06-14 ENCOUNTER — Other Ambulatory Visit: Payer: Self-pay | Admitting: Family Medicine

## 2023-06-14 DIAGNOSIS — R7989 Other specified abnormal findings of blood chemistry: Secondary | ICD-10-CM

## 2023-06-14 DIAGNOSIS — M5416 Radiculopathy, lumbar region: Secondary | ICD-10-CM

## 2023-06-14 MED ORDER — HYDROCODONE-ACETAMINOPHEN 7.5-325 MG PO TABS: 1.0000 | ORAL_TABLET | Freq: Four times a day (QID) | ORAL | 0 refills | Status: DC | PRN

## 2023-06-14 NOTE — Telephone Encounter (Signed)
Unable to leave a message due to voicemail not being set up. 

## 2023-06-14 NOTE — Telephone Encounter (Signed)
I've already sent one request-- he misunderstood when I refused the second one-- please call him to explain what happened. I'm going to have to refuse this one again as well so please tell him I've already sent the first request

## 2023-06-14 NOTE — Telephone Encounter (Signed)
Copied from CRM [id number]. Topic: Clinical - Medication Refill >> Jun 14, 2023 10:45 AM Denese Killings wrote: Most Recent Primary Care Visit:  Provider: Vickii Chafe  Department: [facility]  Visit Type: CLINICAL SUPPORT  Date: 06/04/2023  Medication: HYDROcodone-acetaminophen (NORCO) 7.5-325 MG tablet testosterone cypionate (DEPOTESTOSTERONE CYPIONATE) 200 MG/ML injection  Has the patient contacted their pharmacy? Yes (Agent: If no, request that the patient contact the pharmacy for the refill. If patient does not wish to contact the pharmacy document the reason why and proceed with request.) (Agent: If yes, when and what did the pharmacy advise?) advised pt that they sent over a request and waiting on subscriber  Is this the correct pharmacy for this prescription? Yes If no, delete pharmacy and type the correct one.  This is the patient's preferred pharmacy:  [id number] - [place], [place] - [address] AT [facility] [address] [place] [address] Phone: [phone] Fax: [phone]   Has the prescription been filled recently? Yes  Is the patient out of the medication? Yes  Has the patient been seen for an appointment in the last year OR does the patient have an upcoming appointment? Yes  Can we respond through MyChart? Yes  Agent: Please be advised that Rx refills may take up to 3 business days. We ask that you follow-up with your pharmacy.

## 2023-06-15 ENCOUNTER — Encounter: Payer: Self-pay | Admitting: *Deleted

## 2023-06-15 ENCOUNTER — Ambulatory Visit: Payer: 59

## 2023-06-15 ENCOUNTER — Ambulatory Visit (INDEPENDENT_AMBULATORY_CARE_PROVIDER_SITE_OTHER): Payer: 59

## 2023-06-15 ENCOUNTER — Other Ambulatory Visit: Payer: Self-pay

## 2023-06-15 DIAGNOSIS — R7989 Other specified abnormal findings of blood chemistry: Secondary | ICD-10-CM

## 2023-06-15 MED ORDER — TESTOSTERONE CYPIONATE 200 MG/ML IM SOLN: 200.0000 mg | Freq: Once | INTRAMUSCULAR | Status: DC

## 2023-06-15 NOTE — Telephone Encounter (Signed)
Can you call the pharmacy?? I know I refilled is and he has 2 refills on the script that I sent

## 2023-06-15 NOTE — Telephone Encounter (Signed)
Message also sent to PCP for approval as patient requested via Mychart message to have the Testosterone injection today (early).

## 2023-06-15 NOTE — Telephone Encounter (Signed)
-----   Message from Karie Georges sent at 06/15/2023  8:35 AM EST ----- Regarding: testosterone refills Pt has 2 refills on the script I sent him in December, ok for him to come in early for his injection

## 2023-06-15 NOTE — Telephone Encounter (Signed)
See My chart message

## 2023-06-15 NOTE — Progress Notes (Signed)
Per orders of Dr. Casimiro Needle, injection of Testosterone  given by Vickii Chafe on Right Ventrogluteal.  Patient tolerated injection well.

## 2023-06-15 NOTE — Telephone Encounter (Signed)
Mychart message sent.

## 2023-06-25 ENCOUNTER — Ambulatory Visit
Admission: EM | Admit: 2023-06-25 | Discharge: 2023-06-25 | Disposition: A | Discharge status: Home / Self Care | Payer: 59 | Attending: Internal Medicine | Admitting: Internal Medicine

## 2023-06-25 ENCOUNTER — Encounter: Payer: Self-pay | Admitting: Emergency Medicine

## 2023-06-25 DIAGNOSIS — Z1152 Encounter for screening for COVID-19: Secondary | ICD-10-CM | POA: Insufficient documentation

## 2023-06-25 DIAGNOSIS — R051 Acute cough: Secondary | ICD-10-CM | POA: Diagnosis not present

## 2023-06-25 DIAGNOSIS — J069 Acute upper respiratory infection, unspecified: Secondary | ICD-10-CM | POA: Insufficient documentation

## 2023-06-25 LAB — POCT URINALYSIS DIP (MANUAL ENTRY)
Glucose, UA: NEGATIVE mg/dL
Leukocytes, UA: NEGATIVE
Nitrite, UA: NEGATIVE
Protein Ur, POC: 100 mg/dL — AB
Spec Grav, UA: 1.02 (ref 1.010–1.025)
Urobilinogen, UA: >=8 U/dL — AB
pH, UA: 6.5 (ref 5.0–8.0)

## 2023-06-25 LAB — SARS CORONAVIRUS 2 BY RT PCR: SARS Coronavirus 2 by RT PCR: NEGATIVE

## 2023-06-25 LAB — POCT INFLUENZA A/B
Influenza A, POC: NEGATIVE
Influenza B, POC: NEGATIVE

## 2023-06-25 MED ORDER — PREDNISONE 20 MG PO TABS: 40.0000 mg | ORAL_TABLET | Freq: Every day | ORAL | 0 refills | Status: DC

## 2023-06-25 MED ORDER — PROMETHAZINE-DM 6.25-15 MG/5ML PO SYRP: 5.0000 mL | ORAL_SOLUTION | Freq: Three times a day (TID) | ORAL | 0 refills | Status: DC | PRN

## 2023-06-25 MED ORDER — ONDANSETRON 4 MG PO TBDP: 4.0000 mg | ORAL_TABLET | Freq: Three times a day (TID) | ORAL | 0 refills | Status: DC | PRN

## 2023-06-25 NOTE — Discharge Instructions (Addendum)
Flu A and flu B are negative.  Urinalysis shows evidence of dehydration.  Symptoms most consistent with a viral infection which does not require antibiotics. We will send testing off for Covid especially given the recent travel. We will proceed with the following:   Covid test is pending and if positive we will contact you and you could potentially try Paxlovid although it is past 3 days and likely have minimal improvement in course. CMP done 01/2023  Promethazine DM 5 mL every 8 hours as needed for cough.  Use caution as this medication can cause drowsiness. Prednisone 40 mg daily for 5 days. Take this in the morning. This is a steroid to help with inflammation May continue to use tylenol for fevers. But avoid using ibuprofen or aleve while on the steroids. Zofran 4 mg orally disintegrating tablet every 8 hours as needed for nausea.  Drink plenty of fluids as you are dehydrated.  Return to urgent care or PCP or the ER if symptoms worsen or fail to resolve.

## 2023-06-25 NOTE — ED Provider Notes (Signed)
[facility]    CSN: [id number] Arrival date & time: 06/25/23  4098      History   Chief Complaint Chief Complaint  Patient presents with   Influenza    Flu like symptoms onset 06/21/23. Body aches, chills, fever, cough, and fatigue. No SOB. No chest pains (unless coughing)    HPI Walter Jordan. is a 58 y.o. male.   58 y.o. male who presents to urgent care with complaints of congestion, productive cough, fevers, chills, body aches and fatigue.  He reports that this started on Thursday.  It hit very quickly Thursday night.  He stayed in his hotel as he felt so bad.  He was in [place] when this happened.  He was on a trip from January 25 through February 1.  When he arrived home Saturday night he began using Mucinex all-in-one.  His symptoms continue to worsen with bodyaches and productive cough.  He has also been running fevers.  He is unaware of any sick contacts however he was on a plane several days prior to becoming sick.  He also relates that for the last 5 to 6 days he has had a very foul smell to his urine and was unsure if this might be associated with dehydration versus an infection.  He denies any dysuria or hematuria.     Influenza Presenting symptoms: cough and fever   Presenting symptoms: no shortness of breath, no sore throat and no vomiting   Associated symptoms: chills   Associated symptoms: no ear pain     Past Medical History:  Diagnosis Date   Allergic rhinitis    Allergy    Arthritis    back   ED (erectile dysfunction)    Elevated lipase    Enlarged prostate    GERD (gastroesophageal reflux disease)    Hepatic steatosis    Hypothyroidism    Low back pain    Low testosterone    Sleep apnea    had sleep study 09-10-16- needs new cpap    Patient Active Problem List   Diagnosis Date Noted   Cervicalgia 01/06/2022   Nonallopathic lesion of lumbar region 07/28/2020   Nonallopathic lesion of sacral region 07/28/2020   Nonallopathic lesion of  thoracic region 07/28/2020   Impaired glucose tolerance 12/04/2019   Low testosterone in male 06/11/2019   Controlled type 2 diabetes mellitus without complication, without long-term current use of insulin (HCC) 06/11/2019   Pain in left hand 03/28/2019   Hx of adenomatous colonic polyps 10/25/2017   Lumbar radiculopathy 05/11/2016   VERTIGO 02/22/2010   Obstructive sleep apnea 09/27/2009   ERECTILE DYSFUNCTION, ORGANIC 09/07/2009   CHEST PAIN 03/13/2008   OTHER SYMPTOMS INVOLVING DIGESTIVE SYSTEM OTHER 03/12/2008   RECTAL BLEEDING, HX OF 03/12/2008   Hypothyroidism 02/22/2007   Allergic rhinitis 02/22/2007   GERD 02/22/2007   LOW BACK PAIN 02/22/2007    Past Surgical History:  Procedure Laterality Date   COLONOSCOPY  03/19/2008; 2018   ext. hems    HERNIA REPAIR     x2 1968, inguinal 1973   WISDOM TOOTH EXTRACTION         Home Medications    Prior to Admission medications   Medication Sig Start Date End Date Taking? Authorizing Provider  predniSONE (DELTASONE) 10 MG tablet Take 10 mg by mouth daily with breakfast. 04/24/23  Yes [provider]  alfuzosin (UROXATRAL) 10 MG 24 hr tablet Take 10 mg by mouth daily. 03/05/20   [provider]  aspirin EC 81 MG tablet Take 1 tablet (81 mg total) by mouth daily. Swallow whole. 10/14/21   Jodelle Red, MD  cyclobenzaprine (FLEXERIL) 10 MG tablet Take 1 tablet (10 mg total) by mouth 3 (three) times daily as needed for muscle spasms. 02/15/23   Karie Georges, MD  empagliflozin (JARDIANCE) 10 MG TABS tablet Take 1 tablet (10 mg total) by mouth daily. 05/07/23   Karie Georges, MD  HYDROcodone-acetaminophen (NORCO) 7.5-325 MG tablet Take 1 tablet by mouth every 6 (six) hours as needed for moderate pain (pain score 4-6). 06/14/23   Karie Georges, MD  levothyroxine (SYNTHROID) 50 MCG tablet TAKE 1 TABLET BY MOUTH EVERY DAY BEFORE BREAKFAST 02/15/23   Karie Georges, MD  losartan (COZAAR) 25 MG  tablet TAKE 1 TABLET (25 MG TOTAL) BY MOUTH DAILY. 04/23/23   Karie Georges, MD  Multiple Vitamins-Minerals (MEGA MULTIVITAMIN FOR MEN PO) Take by mouth daily.    [provider]  predniSONE (STERAPRED UNI-PAK 21 TAB) 10 MG (21) TBPK tablet Take as directed 04/24/23   Tarry Kos, MD  rosuvastatin (CRESTOR) 5 MG tablet Take 1 tablet (5 mg total) by mouth daily. 02/15/23 02/10/24  Karie Georges, MD  SYRINGE-NEEDLE, DISP, 3 ML (B-D 3CC LUER-LOK SYR 23GX1") 23G X 1" 3 ML MISC Inject 200 mg testosterone every 2 weeks 06/01/23   Karie Georges, MD  testosterone cypionate (DEPOTESTOSTERONE CYPIONATE) 200 MG/ML injection Inject 1 mL (200 mg total) into the muscle every 14 (fourteen) days. 05/07/23   Karie Georges, MD    Family History Family History  Problem Relation Age of Onset   Cancer Father        sarcoma- both kidneys removed   Diabetes Father        s/p renal transplant   Arthritis Mother    High blood pressure Mother    High Cholesterol Mother    Heart disease Mother    Diabetes Brother    Diabetes Maternal Grandmother    Diabetes Paternal Grandmother    Cancer Paternal Grandmother        colon   Depression Other    Diabetes Other    Hypertension Other    Kidney disease Other    Obesity Sister    Stroke Maternal Grandfather 60       tobacco abuse   Alcohol abuse Maternal Grandfather    Early death Paternal Grandfather        accidental   Prostate cancer Maternal Uncle    Heart attack Maternal Uncle 60   Prostate cancer Maternal Uncle    Cancer Paternal Uncle        uncertain type   Alcohol abuse Paternal Uncle    Colon polyps Neg Hx    Rectal cancer Neg Hx    Stomach cancer Neg Hx    Esophageal cancer Neg Hx    Pancreatic cancer Neg Hx     Social History Social History   Tobacco Use   Smoking status: Never   Smokeless tobacco: Never  Vaping Use   Vaping status: Never Used  Substance Use Topics   Alcohol use: Yes    Comment: social    Drug use: No     Allergies   Patient has no known allergies.   Review of Systems Review of Systems  Constitutional:  Positive for appetite change, chills and fever.  HENT:  Negative for ear pain and sore throat.   Eyes:  Negative  for pain and visual disturbance.  Respiratory:  Positive for cough. Negative for shortness of breath.   Cardiovascular:  Negative for chest pain and palpitations.  Gastrointestinal:  Negative for abdominal pain and vomiting.  Genitourinary:  Negative for dysuria and hematuria.  Musculoskeletal:  Negative for arthralgias and back pain.       Generalized body aches  Skin:  Negative for color change and rash.  Neurological:  Negative for seizures and syncope.  All other systems reviewed and are negative.    Physical Exam Triage Vital Signs ED Triage Vitals  Encounter Vitals Group     BP 06/25/23 1504 123/83     Systolic BP Percentile --      Diastolic BP Percentile --      Pulse Rate 06/25/23 1504 80     Resp 06/25/23 1504 18     Temp 06/25/23 1504 (!) 101.3 F (38.5 C)     Temp Source 06/25/23 1504 Oral     SpO2 06/25/23 1504 94 %     Weight 06/25/23 1502 224 lb 13.9 oz (102 kg)     Height 06/25/23 1502 5' 10.5" (1.791 m)     Head Circumference --      Peak Flow --      Pain Score 06/25/23 1501 6     Pain Loc --      Pain Education --      Exclude from Growth Chart --    No data found.  Updated Vital Signs BP 123/83 (BP Location: Left Arm)   Pulse 80   Temp (!) 101.3 F (38.5 C) (Oral)   Resp 18   Ht 5' 10.5" (1.791 m)   Wt 224 lb 13.9 oz (102 kg)   SpO2 94%   BMI 31.81 kg/m   Visual Acuity Right Eye Distance:   Left Eye Distance:   Bilateral Distance:    Right Eye Near:   Left Eye Near:    Bilateral Near:     Physical Exam Vitals and nursing note reviewed.  Constitutional:      General: He is not in acute distress.    Appearance: He is well-developed.  HENT:     Head: Normocephalic and atraumatic.  Eyes:      Conjunctiva/sclera: Conjunctivae normal.  Cardiovascular:     Rate and Rhythm: Normal rate and regular rhythm.     Heart sounds: No murmur heard. Pulmonary:     Effort: Pulmonary effort is normal. No respiratory distress.     Breath sounds: Normal breath sounds. No decreased air movement. No decreased breath sounds, wheezing or rhonchi.  Abdominal:     Palpations: Abdomen is soft.     Tenderness: There is no abdominal tenderness.  Musculoskeletal:        General: No swelling.     Cervical back: Neck supple.  Skin:    General: Skin is warm and dry.     Capillary Refill: Capillary refill takes less than 2 seconds.  Neurological:     Mental Status: He is alert.  Psychiatric:        Mood and Affect: Mood normal.      UC Treatments / Results  Labs (all labs ordered are listed, but only abnormal results are displayed) Labs Reviewed  POCT INFLUENZA A/B    EKG   Radiology No results found.  Procedures Procedures (including critical care time)  Medications Ordered in UC Medications - No data to display  Initial Impression / Assessment and Plan / UC  Course  I have reviewed the triage vital signs and the nursing notes.  Pertinent labs & imaging results that were available during my care of the patient were reviewed by me and considered in my medical decision making (see chart for details).     Acute cough - Plan: SARS Coronavirus 2 by RT PCR (hospital order, performed in [facility] hospital lab) *cepheid single result test* Anterior Nasal Swab, SARS Coronavirus 2 by RT PCR (hospital order, performed in [facility] hospital lab) *cepheid single result test* Anterior Nasal Swab  Viral upper respiratory infection   Flu A and flu B are negative.  Urinalysis shows evidence of dehydration.  Symptoms most consistent with a viral infection which does not require antibiotics. We will send testing off for Covid especially given the recent travel. We will proceed with the following:    Covid test is pending and if positive we will contact you and you could potentially try Paxlovid although it is past 3 days and likely have minimal improvement in course. CMP done 01/2023  Promethazine DM 5 mL every 8 hours as needed for cough.  Use caution as this medication can cause drowsiness. Prednisone 40 mg daily for 5 days. Take this in the morning. This is a steroid to help with inflammation May continue to use tylenol for fevers. But avoid using ibuprofen or aleve while on the steroids. Zofran 4 mg orally disintegrating tablet every 8 hours as needed for nausea.  Drink plenty of fluids as you are dehydrated.  Return to urgent care or PCP or the ER if symptoms worsen or fail to resolve.    Final Clinical Impressions(s) / UC Diagnoses   Final diagnoses:  None   Discharge Instructions   None    ED Prescriptions   None    PDMP not reviewed this encounter.   Landis Martins, [place] 06/25/23 [phone]

## 2023-06-26 ENCOUNTER — Ambulatory Visit: Payer: 59 | Admitting: Family Medicine

## 2023-06-29 ENCOUNTER — Ambulatory Visit: Payer: 59

## 2023-06-29 ENCOUNTER — Ambulatory Visit: Payer: 59 | Admitting: Family Medicine

## 2023-06-29 VITALS — BP 116/72 | HR 90 | Temp 98.4°F | Resp 18 | Ht 70.0 in | Wt 220.4 lb

## 2023-06-29 DIAGNOSIS — J0100 Acute maxillary sinusitis, unspecified: Secondary | ICD-10-CM

## 2023-06-29 DIAGNOSIS — R7989 Other specified abnormal findings of blood chemistry: Secondary | ICD-10-CM

## 2023-06-29 MED ORDER — AMOXICILLIN-POT CLAVULANATE 875-125 MG PO TABS: 1.0000 | ORAL_TABLET | Freq: Two times a day (BID) | ORAL | 0 refills | Status: AC

## 2023-06-29 MED ORDER — METHYLPREDNISOLONE 4 MG PO TBPK: ORAL_TABLET | ORAL | 0 refills | Status: DC

## 2023-06-29 NOTE — Progress Notes (Signed)
 Established Patient Office Visit  Subjective   Patient ID: Walter Jordan., male    DOB: 09/16/1965  Age: 58 y.o. MRN: [id number]  Chief Complaint  Patient presents with   Sinus Problem    Tested for Covid and Flu on Monday. Both were negative    Pt is here for acute problems, states that he was out of country at the end of January, started with illness on 1/31. States he is having a lot of congestion, went to UC and was negative for flu and covid, was having fevers, was given prednisone  and cough medication, is having pain in his face, jaw and teeth, has been taking sudafed without any improvement, there is a lot of rpessure in his face, nasal congestion, no ear pain. States that he has been sick for over 7 days with only improvement in the body aches and fever.    Current Outpatient Medications  Medication Instructions   alfuzosin (UROXATRAL) 10 mg, Daily   amoxicillin -clavulanate (AUGMENTIN ) 875-125 MG tablet 1 tablet, Oral, 2 times daily   aspirin  EC 81 mg, Oral, Daily, Swallow whole.   cyclobenzaprine  (FLEXERIL ) 10 mg, Oral, 3 times daily PRN   empagliflozin  (JARDIANCE ) 10 mg, Oral, Daily   HYDROcodone -acetaminophen  (NORCO) 7.5-325 MG tablet 1 tablet, Oral, Every 6 hours PRN   levothyroxine  (SYNTHROID ) 50 MCG tablet TAKE 1 TABLET BY MOUTH EVERY DAY BEFORE BREAKFAST   losartan  (COZAAR ) 25 mg, Oral, Daily   methylPREDNISolone  (MEDROL  DOSEPAK) 4 MG TBPK tablet Take package as directed   Multiple Vitamins-Minerals (MEGA MULTIVITAMIN FOR MEN PO) Daily   ondansetron  (ZOFRAN -ODT) 4 mg, Oral, Every 8 hours PRN   promethazine -dextromethorphan (PROMETHAZINE -DM) 6.25-15 MG/5ML syrup 5 mLs, Oral, Every 8 hours PRN   rosuvastatin  (CRESTOR ) 5 mg, Oral, Daily   SYRINGE-NEEDLE, DISP, 3 ML (B-D 3CC LUER-LOK SYR 23GX1) 23G X 1 3 ML MISC Inject 200 mg testosterone  every 2 weeks   testosterone  cypionate (DEPOTESTOSTERONE CYPIONATE) 200 mg, Intramuscular, Every 14 days      Review of Systems   All other systems reviewed and are negative.     Objective:     BP 116/72 (BP Location: Left Arm, Patient Position: Sitting, Cuff Size: Large)   Pulse 90   Temp 98.4 F (36.9 C) (Oral)   Resp 18   Ht 5' 10 (1.778 m)   Wt 220 lb 6.4 oz (100 kg)   SpO2 97%   BMI 31.62 kg/m    Physical Exam Vitals reviewed.  Constitutional:      Appearance: Normal appearance. He is obese.  HENT:     Right Ear: Tympanic membrane and ear canal normal.     Left Ear: Tympanic membrane and ear canal normal.     Nose: Congestion present.     Mouth/Throat:     Mouth: Mucous membranes are moist.     Pharynx: Posterior oropharyngeal erythema present.  Cardiovascular:     Rate and Rhythm: Normal rate and regular rhythm.     Pulses: Normal pulses.     Heart sounds: No murmur heard. Pulmonary:     Effort: Pulmonary effort is normal.     Breath sounds: Wheezing (mild lower inspiratory wheezing in the posterior lung fields) present.  Neurological:     Mental Status: He is alert and oriented to person, place, and time. Mental status is at baseline.      No results found for any visits on 06/29/23.    The 10-year ASCVD risk score (Arnett DK, et  al., 2019) is: 16.7%    Assessment & Plan:  Acute non-recurrent maxillary sinusitis -     Amoxicillin -Pot Clavulanate; Take 1 tablet by mouth 2 (two) times daily for 10 days.  Dispense: 20 tablet; Refill: 0 -     methylPREDNISolone ; Take package as directed  Dispense: 21 each; Refill: 0  Low testosterone  in male -     Testosterone    Mild wheezing plus acute sinus infection, will treat with augmentin  and give another dose pak for the wheezing.   Return in about 3 months (around 09/26/2023).    Heron CHRISTELLA Sharper, MD

## 2023-07-04 ENCOUNTER — Ambulatory Visit: Payer: 59 | Admitting: Family Medicine

## 2023-07-04 ENCOUNTER — Encounter: Payer: Self-pay | Admitting: Family Medicine

## 2023-07-04 VITALS — BP 130/72 | HR 80 | Temp 99.0°F | Resp 16 | Ht 70.0 in

## 2023-07-04 DIAGNOSIS — R519 Headache, unspecified: Secondary | ICD-10-CM | POA: Diagnosis not present

## 2023-07-04 DIAGNOSIS — R0981 Nasal congestion: Secondary | ICD-10-CM

## 2023-07-04 MED ORDER — FLUTICASONE PROPIONATE 50 MCG/ACT NA SUSP: 1.0000 | Freq: Two times a day (BID) | NASAL | 0 refills | Status: DC

## 2023-07-04 NOTE — Patient Instructions (Addendum)
A few things to remember from today's visit:  Facial pain - Plan: CT Maxillofacial WO CM Complete antibiotic treatment. Monitor for fever. Arrange appt with dentist.  If you need refills for medications you take chronically, please call your pharmacy. Do not use My Chart to request refills or for acute issues that need immediate attention. If you send a my chart message, it may take a few days to be addressed, specially if I am not in the office.  Please be sure medication list is accurate. If a new problem present, please set up appointment sooner than planned today.

## 2023-07-04 NOTE — Progress Notes (Signed)
ACUTE VISIT Chief Complaint  Patient presents with   Facial Pain    Seen on 2/7 and given abx. Still having facial pain   HPI: WalterWalter Jordan. is a 58 y.o. male with a PMHx significant for OSA, allergic rhinitis, GERD, hypothyroidism, DM II, aand low back pain, among some, who is here today complaining of facial pain as described above.   Left-sided facial pain, maxillary and in his teeth. He also endorses nasal congestion and chills.  He mentions he was recently abroad and came home sick with respiratory symptoms. He was seen at [facility] on 06/25/23. given prednisone, as well as medications for cough and nausea He was seen here on 2/7 by Dr. Casimiro Needle because the facial pain was continuing and given Augmentin 875-125 mg twice daily for 10 days, today 5th days of treatment. Marland Kitchen  He describes the pain as a constant pressure, and currently rates it as a 2-3/10. Before the antibiotic, it was a 7/10.  He has also been taking hydrocodone for the pain, which helps, but when it wears off the pain returns.  He has not seen his dentist yet.  Pertinent negatives include fever, visual changes, ear ache, swallowing difficulty, wheezing, cough, or pain while chewing.   Review of Systems  Constitutional:  Positive for fatigue. Negative for activity change and appetite change.  HENT:  Negative for postnasal drip and sore throat.   Eyes:  Negative for discharge and redness.  Cardiovascular:  Negative for leg swelling.  Gastrointestinal:  Negative for abdominal pain, nausea and vomiting.  Genitourinary:  Negative for decreased urine volume and hematuria.  Musculoskeletal:  Negative for gait problem and myalgias.  Allergic/Immunologic: Positive for environmental allergies.  Neurological:  Negative for syncope, weakness and headaches.  See other pertinent positives and negatives in HPI.  Current Outpatient Medications on File Prior to Visit  Medication Sig Dispense Refill   alfuzosin (UROXATRAL) 10 MG 24  hr tablet Take 10 mg by mouth daily.     amoxicillin-clavulanate (AUGMENTIN) 875-125 MG tablet Take 1 tablet by mouth 2 (two) times daily for 10 days. 20 tablet 0   aspirin EC 81 MG tablet Take 1 tablet (81 mg total) by mouth daily. Swallow whole. 90 tablet 3   cyclobenzaprine (FLEXERIL) 10 MG tablet Take 1 tablet (10 mg total) by mouth 3 (three) times daily as needed for muscle spasms. 30 tablet 5   empagliflozin (JARDIANCE) 10 MG TABS tablet Take 1 tablet (10 mg total) by mouth daily. 90 tablet 1   HYDROcodone-acetaminophen (NORCO) 7.5-325 MG tablet Take 1 tablet by mouth every 6 (six) hours as needed for moderate pain (pain score 4-6). 60 tablet 0   levothyroxine (SYNTHROID) 50 MCG tablet TAKE 1 TABLET BY MOUTH EVERY DAY BEFORE BREAKFAST 90 tablet 3   losartan (COZAAR) 25 MG tablet TAKE 1 TABLET (25 MG TOTAL) BY MOUTH DAILY. 90 tablet 0   methylPREDNISolone (MEDROL DOSEPAK) 4 MG TBPK tablet Take package as directed 21 each 0   Multiple Vitamins-Minerals (MEGA MULTIVITAMIN FOR MEN PO) Take by mouth daily.     ondansetron (ZOFRAN-ODT) 4 MG disintegrating tablet Take 1 tablet (4 mg total) by mouth every 8 (eight) hours as needed for nausea or vomiting. 20 tablet 0   promethazine-dextromethorphan (PROMETHAZINE-DM) 6.25-15 MG/5ML syrup Take 5 mLs by mouth every 8 (eight) hours as needed for cough. 180 mL 0   rosuvastatin (CRESTOR) 5 MG tablet Take 1 tablet (5 mg total) by mouth daily. 90 tablet 1  SYRINGE-NEEDLE, DISP, 3 ML (B-D 3CC LUER-LOK SYR 23GX1") 23G X 1" 3 ML MISC Inject 200 mg testosterone every 2 weeks 30 each 0   testosterone cypionate (DEPOTESTOSTERONE CYPIONATE) 200 MG/ML injection Inject 1 mL (200 mg total) into the muscle every 14 (fourteen) days. 2 mL 2   Current Facility-Administered Medications on File Prior to Visit  Medication Dose Route Frequency Provider Last Rate Last Admin   testosterone cypionate (DEPOTESTOSTERONE CYPIONATE) injection 200 mg  200 mg Intramuscular Q14 Days  Karie Georges, MD   200 mg at 05/03/23 1030   testosterone cypionate (DEPOTESTOSTERONE CYPIONATE) injection 200 mg  200 mg Intramuscular Q14 Days Nafziger, Kandee Keen, NP   200 mg at 06/29/23 1004    Past Medical History:  Diagnosis Date   Allergic rhinitis    Allergy    Arthritis    back   ED (erectile dysfunction)    Elevated lipase    Enlarged prostate    GERD (gastroesophageal reflux disease)    Hepatic steatosis    Hypothyroidism    Low back pain    Low testosterone    Sleep apnea    had sleep study 09-10-16- needs new cpap   No Known Allergies  Social History   Socioeconomic History   Marital status: Married    Spouse name: Not on file   Number of children: Not on file   Years of education: Not on file   Highest education level: Some college, no degree  Occupational History   Not on file  Tobacco Use   Smoking status: Never   Smokeless tobacco: Never  Vaping Use   Vaping status: Never Used  Substance and Sexual Activity   Alcohol use: Yes    Comment: social   Drug use: No   Sexual activity: Not on file  Other Topics Concern   Not on file  Social History Narrative   Not on file   Social Drivers of Health   Financial Resource Strain: Low Risk  (05/07/2023)   Overall Financial Resource Strain (CARDIA)    Difficulty of Paying Living Expenses: Not very hard  Food Insecurity: No Food Insecurity (05/07/2023)   Hunger Vital Sign    Worried About Running Out of Food in the Last Year: Never true    Ran Out of Food in the Last Year: Never true  Transportation Needs: No Transportation Needs (05/07/2023)   PRAPARE - Administrator, Civil Service (Medical): No    Lack of Transportation (Non-Medical): No  Physical Activity: Insufficiently Active (05/07/2023)   Exercise Vital Sign    Days of Exercise per Week: 1 day    Minutes of Exercise per Session: 90 min  Stress: Stress Concern Present (05/07/2023)   [employer] of Occupational Health -  Occupational Stress Questionnaire    Feeling of Stress : To some extent  Social Connections: Unknown (05/07/2023)   Social Connection and Isolation Panel [NHANES]    Frequency of Communication with Friends and Family: Once a week    Frequency of Social Gatherings with Friends and Family: Patient declined    Attends Religious Services: More than 4 times per year    Active Member of Clubs or Organizations: Patient declined    Attends Engineer, structural: More than 4 times per year    Marital Status: Married    Vitals:   07/04/23 1323  BP: 130/72  Pulse: 80  Resp: 16  Temp: 99 F (37.2 C)  SpO2: 99%   Body  mass index is 31.62 kg/m.  Physical Exam Vitals and nursing note reviewed.  Constitutional:      General: He is not in acute distress.    Appearance: He is well-developed. He is not ill-appearing.  HENT:     Head: Normocephalic and atraumatic.     Right Ear: Tympanic membrane, ear canal and external ear normal.     Left Ear: Tympanic membrane, ear canal and external ear normal.     Nose:     Right Turbinates: Enlarged.     Left Turbinates: Enlarged.     Right Sinus: No maxillary sinus tenderness or frontal sinus tenderness.     Left Sinus: No maxillary sinus tenderness or frontal sinus tenderness.     Comments: Minimal edema on maxillary area, left side. No pain with TMJ movement or palpation of gums or teeth    Mouth/Throat:     Mouth: Mucous membranes are moist.     Pharynx: Oropharynx is clear. Uvula midline.  Eyes:     Conjunctiva/sclera: Conjunctivae normal.  Cardiovascular:     Rate and Rhythm: Normal rate and regular rhythm.     Heart sounds: No murmur heard. Pulmonary:     Effort: Pulmonary effort is normal. No respiratory distress.     Breath sounds: Normal breath sounds. No stridor.  Lymphadenopathy:     Head:     Right side of head: No submandibular adenopathy.     Left side of head: No submandibular or preauricular adenopathy.     Cervical:  No cervical adenopathy.  Skin:    General: Skin is warm.     Findings: No erythema or rash.  Neurological:     General: No focal deficit present.     Mental Status: He is alert and oriented to person, place, and time.     Cranial Nerves: No cranial nerve deficit.     Gait: Gait normal.  Psychiatric:        Mood and Affect: Affect normal. Mood is anxious.   ASSESSMENT AND PLAN:  Mr. Scarano was seen today for facial pain.   Nasal sinus congestion Recommend Flonase nasal spray at night for 10-14 days then prn  and nasal saline irrigations as needed.  -     Fluticasone Propionate; Place 1 spray into both nostrils 2 (two) times daily.  Dispense: 16 g; Refill: 0  Facial pain Localized in left maxillary area and cheek. We discussed possible etiologies, including other type of sinus disease, TMJ, and dental problem. Recommend arranging appt with his dentis.  He is on his 5th day of Augmentin 875-125 mc g bid, reports that pain has improved but concerned because it has not resolved. Complete abx treatment. Maxillofacial CT will be arranged.  -     CT MAXILLOFACIAL WO CONTRAST; Future  I spent a total of 30 minutes in both face to face and non face to face activities for this visit on the date of this encounter. During this time history was obtained and documented, examination was performed and assessment/plan discussed.  Return in about 2 weeks (around 07/18/2023) for facial pain with PCP.  I, Rolla Etienne Wierda, acting as a scribe for Odetta Forness [place], MD., have documented all relevant documentation on the behalf of Crystalann Korf [place], MD, as directed by  Brendy Ficek [place], MD while in the presence of Cailie Bosshart [place], MD.   I, Kimbely Whiteaker [place], MD, have reviewed all documentation for this visit. The documentation on 07/04/23 for the exam, diagnosis, procedures, and orders are all  accurate and complete.  Rekita Miotke G. [place], MD  [facility]. [place] office.

## 2023-07-13 ENCOUNTER — Ambulatory Visit: Payer: 59

## 2023-07-13 DIAGNOSIS — R7989 Other specified abnormal findings of blood chemistry: Secondary | ICD-10-CM | POA: Diagnosis not present

## 2023-07-13 NOTE — Progress Notes (Signed)
1 ml Testosterone Cyp given in right glute. Pt tolerated well will return in 2 weeks.

## 2023-07-21 ENCOUNTER — Other Ambulatory Visit: Payer: Self-pay | Admitting: Family Medicine

## 2023-07-21 DIAGNOSIS — I10 Essential (primary) hypertension: Secondary | ICD-10-CM

## 2023-07-23 ENCOUNTER — Ambulatory Visit (HOSPITAL_BASED_OUTPATIENT_CLINIC_OR_DEPARTMENT_OTHER)
Admission: RE | Admit: 2023-07-23 | Discharge: 2023-07-23 | Disposition: A | Discharge status: Home / Self Care | Payer: 59 | Source: Ambulatory Visit | Attending: Family Medicine | Admitting: Family Medicine

## 2023-07-23 DIAGNOSIS — R519 Headache, unspecified: Secondary | ICD-10-CM | POA: Insufficient documentation

## 2023-07-24 ENCOUNTER — Encounter: Payer: Self-pay | Admitting: Family Medicine

## 2023-07-24 ENCOUNTER — Other Ambulatory Visit: Payer: Self-pay | Admitting: Family Medicine

## 2023-07-24 DIAGNOSIS — M5416 Radiculopathy, lumbar region: Secondary | ICD-10-CM

## 2023-07-24 DIAGNOSIS — R0981 Nasal congestion: Secondary | ICD-10-CM

## 2023-07-24 LAB — HM DIABETES EYE EXAM

## 2023-07-24 MED ORDER — HYDROCODONE-ACETAMINOPHEN 7.5-325 MG PO TABS: 1.0000 | ORAL_TABLET | Freq: Four times a day (QID) | ORAL | 0 refills | Status: DC | PRN

## 2023-07-27 ENCOUNTER — Ambulatory Visit: Admitting: *Deleted

## 2023-07-27 ENCOUNTER — Ambulatory Visit

## 2023-07-27 DIAGNOSIS — R7989 Other specified abnormal findings of blood chemistry: Secondary | ICD-10-CM

## 2023-07-27 DIAGNOSIS — E291 Testicular hypofunction: Secondary | ICD-10-CM | POA: Diagnosis not present

## 2023-07-27 MED ORDER — TESTOSTERONE CYPIONATE 200 MG/ML IM SOLN
200.0000 mg | Freq: Once | INTRAMUSCULAR | Status: AC
  Administered 2023-07-27: 200 mg via INTRAMUSCULAR

## 2023-07-27 NOTE — Progress Notes (Signed)
 Per orders of Dr. Casimiro Needle, injection of Testosterone cypionate 200mg  given by Sallee Lange A. Patient tolerated injection well.

## 2023-07-31 ENCOUNTER — Encounter: Payer: Self-pay | Admitting: Family Medicine

## 2023-08-10 ENCOUNTER — Ambulatory Visit (INDEPENDENT_AMBULATORY_CARE_PROVIDER_SITE_OTHER)

## 2023-08-10 DIAGNOSIS — R7989 Other specified abnormal findings of blood chemistry: Secondary | ICD-10-CM | POA: Diagnosis not present

## 2023-08-10 MED ORDER — TESTOSTERONE CYPIONATE 200 MG/ML IM SOLN
200.0000 mg | INTRAMUSCULAR | Status: AC
  Administered 2023-08-10 – 2023-10-05 (×4): 200 mg via INTRAMUSCULAR

## 2023-08-10 NOTE — Progress Notes (Signed)
 Per orders of Dr. Nira Conn, injection of Testosterone 200 mg/mL given by Carola Rhine. Patient tolerated injection well.

## 2023-08-14 ENCOUNTER — Encounter: Payer: Self-pay | Admitting: Family Medicine

## 2023-08-23 ENCOUNTER — Ambulatory Visit: Admitting: Sports Medicine

## 2023-08-24 ENCOUNTER — Ambulatory Visit

## 2023-08-24 DIAGNOSIS — R7989 Other specified abnormal findings of blood chemistry: Secondary | ICD-10-CM

## 2023-08-24 NOTE — Progress Notes (Signed)
 Patient is in office today for a nurse visit for Testosterone Injection. Patient Injection was given in the  Right upper quad. gluteus. Patient tolerated injection well.

## 2023-09-10 ENCOUNTER — Ambulatory Visit (INDEPENDENT_AMBULATORY_CARE_PROVIDER_SITE_OTHER)

## 2023-09-10 DIAGNOSIS — R7989 Other specified abnormal findings of blood chemistry: Secondary | ICD-10-CM

## 2023-09-10 MED ORDER — TESTOSTERONE CYPIONATE 200 MG/ML IM SOLN
200.0000 mg | Freq: Once | INTRAMUSCULAR | Status: AC
  Administered 2023-09-10: 200 mg via INTRAMUSCULAR

## 2023-09-10 NOTE — Progress Notes (Signed)
 Patient is in office today for a nurse visit for Testosterone Injection. Patient Injection was given in the  Right upper quad. gluteus. Patient tolerated injection well.

## 2023-09-11 ENCOUNTER — Other Ambulatory Visit: Payer: Self-pay | Admitting: Family Medicine

## 2023-09-11 DIAGNOSIS — M5416 Radiculopathy, lumbar region: Secondary | ICD-10-CM

## 2023-09-12 MED ORDER — HYDROCODONE-ACETAMINOPHEN 7.5-325 MG PO TABS: 1.0000 | ORAL_TABLET | Freq: Four times a day (QID) | ORAL | 0 refills | Status: DC | PRN

## 2023-09-19 ENCOUNTER — Other Ambulatory Visit: Payer: Self-pay | Admitting: Family Medicine

## 2023-09-19 DIAGNOSIS — R7989 Other specified abnormal findings of blood chemistry: Secondary | ICD-10-CM

## 2023-09-21 ENCOUNTER — Ambulatory Visit (INDEPENDENT_AMBULATORY_CARE_PROVIDER_SITE_OTHER)

## 2023-09-21 DIAGNOSIS — R7989 Other specified abnormal findings of blood chemistry: Secondary | ICD-10-CM | POA: Diagnosis not present

## 2023-09-21 NOTE — Progress Notes (Signed)
 Patient is in office today for a nurse visit for Testosterone Injection. Patient Injection was given in the  Left upper quad. gluteus. Patient tolerated injection well.

## 2023-09-28 ENCOUNTER — Encounter (HOSPITAL_COMMUNITY): Payer: Self-pay

## 2023-10-05 ENCOUNTER — Ambulatory Visit

## 2023-10-05 DIAGNOSIS — R7989 Other specified abnormal findings of blood chemistry: Secondary | ICD-10-CM | POA: Diagnosis not present

## 2023-10-05 NOTE — Progress Notes (Signed)
 Patient is in office today for a nurse visit for Testosterone  injection. Patient Injection was given in the  Right upper quad. gluteus. Patient tolerated injection well.

## 2023-10-17 ENCOUNTER — Other Ambulatory Visit: Payer: Self-pay | Admitting: Family Medicine

## 2023-10-17 DIAGNOSIS — E119 Type 2 diabetes mellitus without complications: Secondary | ICD-10-CM

## 2023-10-19 ENCOUNTER — Ambulatory Visit (INDEPENDENT_AMBULATORY_CARE_PROVIDER_SITE_OTHER)

## 2023-10-19 DIAGNOSIS — R7989 Other specified abnormal findings of blood chemistry: Secondary | ICD-10-CM | POA: Diagnosis not present

## 2023-10-19 MED ORDER — TESTOSTERONE CYPIONATE 200 MG/ML IM SOLN
200.0000 mg | INTRAMUSCULAR | Status: AC
  Administered 2023-10-19 – 2024-06-27 (×10): 200 mg via INTRAMUSCULAR

## 2023-10-19 NOTE — Progress Notes (Signed)
 Per orders of Dr. Nira Conn, injection of Testosterone 200 mg/mL given by Carola Rhine. Patient tolerated injection well.

## 2023-11-02 ENCOUNTER — Ambulatory Visit (INDEPENDENT_AMBULATORY_CARE_PROVIDER_SITE_OTHER): Admitting: *Deleted

## 2023-11-02 DIAGNOSIS — R7989 Other specified abnormal findings of blood chemistry: Secondary | ICD-10-CM

## 2023-11-02 MED ORDER — TESTOSTERONE CYPIONATE 200 MG/ML IM SOLN
200.0000 mg | Freq: Once | INTRAMUSCULAR | Status: AC
  Administered 2023-11-02: 200 mg via INTRAMUSCULAR

## 2023-11-02 NOTE — Progress Notes (Signed)
Per orders of Dr. Burchette, injection of Testosterone cypionate 200mg given by Adaiah Morken A. Patient tolerated injection well.  

## 2023-11-16 ENCOUNTER — Ambulatory Visit (INDEPENDENT_AMBULATORY_CARE_PROVIDER_SITE_OTHER)

## 2023-11-16 DIAGNOSIS — R7989 Other specified abnormal findings of blood chemistry: Secondary | ICD-10-CM | POA: Diagnosis not present

## 2023-11-16 NOTE — Progress Notes (Signed)
 Patient is in office today for a nurse visit for Testosterone Injection. Patient Injection was given in the  Right upper quad. gluteus. Patient tolerated injection well.

## 2023-11-22 ENCOUNTER — Other Ambulatory Visit: Payer: Self-pay | Admitting: Family Medicine

## 2023-11-22 DIAGNOSIS — M5416 Radiculopathy, lumbar region: Secondary | ICD-10-CM

## 2023-11-22 NOTE — Telephone Encounter (Signed)
 Copied from CRM [id number]. Topic: Clinical - Medication Refill >> Nov 22, 2023 12:25 PM [place] J wrote: Medication: HYDROcodone -acetaminophen  (NORCO) 7.5-325 MG tablet  Has the patient contacted their pharmacy? No (Agent: If no, request that the patient contact the pharmacy for the refill. If patient does not wish to contact the pharmacy document the reason why and proceed with request.) (Agent: If yes, when and what did the pharmacy advise?) Pharmacy will not refill controlled substance.  This is the patient's preferred pharmacy:  [id number] - [place], [place] - [address] AT [facility] [address] [address] [place] [address] Phone: [phone] Fax: [phone]  Is this the correct pharmacy for this prescription? Yes If no, delete pharmacy and type the correct one.   Has the prescription been filled recently? No  Is the patient out of the medication? Yes  Has the patient been seen for an appointment in the last year OR does the patient have an upcoming appointment? Yes  Can we respond through MyChart? Yes  Agent: Please be advised that Rx refills may take up to 3 business days. We ask that you follow-up with your pharmacy.

## 2023-11-30 ENCOUNTER — Ambulatory Visit: Admitting: Family Medicine

## 2023-11-30 ENCOUNTER — Encounter: Payer: Self-pay | Admitting: Family Medicine

## 2023-11-30 VITALS — BP 100/70 | HR 76 | Temp 98.1°F | Ht 70.0 in | Wt 224.3 lb

## 2023-11-30 DIAGNOSIS — M5416 Radiculopathy, lumbar region: Secondary | ICD-10-CM

## 2023-11-30 DIAGNOSIS — E785 Hyperlipidemia, unspecified: Secondary | ICD-10-CM

## 2023-11-30 DIAGNOSIS — E119 Type 2 diabetes mellitus without complications: Secondary | ICD-10-CM

## 2023-11-30 DIAGNOSIS — E039 Hypothyroidism, unspecified: Secondary | ICD-10-CM | POA: Diagnosis not present

## 2023-11-30 DIAGNOSIS — I10 Essential (primary) hypertension: Secondary | ICD-10-CM

## 2023-11-30 DIAGNOSIS — Z1211 Encounter for screening for malignant neoplasm of colon: Secondary | ICD-10-CM

## 2023-11-30 DIAGNOSIS — R7989 Other specified abnormal findings of blood chemistry: Secondary | ICD-10-CM | POA: Diagnosis not present

## 2023-11-30 LAB — CBC WITH DIFFERENTIAL/PLATELET
Basophils Absolute: 0.1 K/uL (ref 0.0–0.1)
Basophils Relative: 1.7 % (ref 0.0–3.0)
Eosinophils Absolute: 0.2 K/uL (ref 0.0–0.7)
Eosinophils Relative: 3.5 % (ref 0.0–5.0)
HCT: 52.3 % — ABNORMAL HIGH (ref 39.0–52.0)
Hemoglobin: 17.5 g/dL — ABNORMAL HIGH (ref 13.0–17.0)
Lymphocytes Relative: 33 % (ref 12.0–46.0)
Lymphs Abs: 1.4 K/uL (ref 0.7–4.0)
MCHC: 33.4 g/dL (ref 30.0–36.0)
MCV: 96.5 fl (ref 78.0–100.0)
Monocytes Absolute: 0.7 K/uL (ref 0.1–1.0)
Monocytes Relative: 14.9 % — ABNORMAL HIGH (ref 3.0–12.0)
Neutro Abs: 2.1 K/uL (ref 1.4–7.7)
Neutrophils Relative %: 46.9 % (ref 43.0–77.0)
Platelets: 215 K/uL (ref 150.0–400.0)
RBC: 5.42 Mil/uL (ref 4.22–5.81)
RDW: 14.4 % (ref 11.5–15.5)
WBC: 4.4 K/uL (ref 4.0–10.5)

## 2023-11-30 LAB — POCT GLYCOSYLATED HEMOGLOBIN (HGB A1C): Hemoglobin A1C: 5.6 % (ref 4.0–5.6)

## 2023-11-30 LAB — COMPREHENSIVE METABOLIC PANEL WITH GFR
ALT: 34 U/L (ref 0–53)
AST: 31 U/L (ref 0–37)
Albumin: 4.5 g/dL (ref 3.5–5.2)
Alkaline Phosphatase: 54 U/L (ref 39–117)
BUN: 16 mg/dL (ref 6–23)
CO2: 33 meq/L — ABNORMAL HIGH (ref 19–32)
Calcium: 9.1 mg/dL (ref 8.4–10.5)
Chloride: 100 meq/L (ref 96–112)
Creatinine, Ser: 1.25 mg/dL (ref 0.40–1.50)
GFR: 63.55 mL/min (ref >60.00)
Glucose, Bld: 105 mg/dL — ABNORMAL HIGH (ref 70–99)
Potassium: 4.5 meq/L (ref 3.5–5.1)
Sodium: 137 meq/L (ref 135–145)
Total Bilirubin: 1.3 mg/dL — ABNORMAL HIGH (ref 0.2–1.2)
Total Protein: 7.2 g/dL (ref 6.0–8.3)

## 2023-11-30 LAB — LIPID PANEL
Cholesterol: 139 mg/dL (ref 0–200)
HDL: 48 mg/dL (ref >39.00)
LDL Cholesterol: 53 mg/dL (ref 0–99)
NonHDL: 91.02
Total CHOL/HDL Ratio: 3
Triglycerides: 191 mg/dL — ABNORMAL HIGH (ref 0.0–149.0)
VLDL: 38.2 mg/dL (ref 0.0–40.0)

## 2023-11-30 LAB — MICROALBUMIN / CREATININE URINE RATIO
Creatinine,U: 111.4 mg/dL
Microalb Creat Ratio: UNDETERMINED mg/g (ref 0.0–30.0)
Microalb, Ur: <0.7 mg/dL

## 2023-11-30 LAB — TESTOSTERONE: Testosterone: 124.93 ng/dL — ABNORMAL LOW (ref 300.00–890.00)

## 2023-11-30 LAB — TSH: TSH: 3.47 u[IU]/mL (ref 0.35–5.50)

## 2023-11-30 MED ORDER — TESTOSTERONE CYPIONATE 200 MG/ML IM SOLN
200.0000 mg | Freq: Once | INTRAMUSCULAR | Status: AC
  Administered 2023-11-30: 200 mg via INTRAMUSCULAR

## 2023-11-30 MED ORDER — CYCLOBENZAPRINE HCL 10 MG PO TABS: 10.0000 mg | ORAL_TABLET | Freq: Three times a day (TID) | ORAL | 5 refills | Status: AC | PRN

## 2023-11-30 MED ORDER — EMPAGLIFLOZIN 10 MG PO TABS: 10.0000 mg | ORAL_TABLET | Freq: Every day | ORAL | 1 refills | Status: DC

## 2023-11-30 MED ORDER — TESTOSTERONE CYPIONATE 200 MG/ML IM SOLN
200.0000 mg | INTRAMUSCULAR | 2 refills | Status: DC
Start: 2023-11-30 — End: 2023-12-12

## 2023-11-30 MED ORDER — LEVOTHYROXINE SODIUM 50 MCG PO TABS: ORAL_TABLET | ORAL | 3 refills | Status: AC

## 2023-11-30 MED ORDER — LOSARTAN POTASSIUM 25 MG PO TABS: 25.0000 mg | ORAL_TABLET | Freq: Every day | ORAL | 1 refills | Status: AC

## 2023-11-30 MED ORDER — HYDROCODONE-ACETAMINOPHEN 7.5-325 MG PO TABS: 1.0000 | ORAL_TABLET | Freq: Four times a day (QID) | ORAL | 0 refills | Status: DC | PRN

## 2023-11-30 NOTE — Progress Notes (Signed)
 Established Patient Office Visit  Subjective   Patient ID: Walter Kudrna., male    DOB: Feb 22, 1966  Age: 58 y.o. MRN: [id number]  Chief Complaint  Patient presents with   Medical Management of Chronic Issues    Pt is here for follow up and medication refills. He reports no changes since hi slast visit, no new symptoms or issues to report.    Low T-- pt reports good control of his symptoms with the testosterone  injections. He denies any side effects to the medication.   DM-- is taking his medication as prescribed. He denies any vision changes, no polyuria. A1C today is 5.6, in the normal range with just 10 mg jardiance  daily. Is due for urine testing and GFR today   Hypothyroidism-- pt reports compliance with meds, needs new TSH today and refills. Denies any constipation, weight gain, no new symptoms.    Lumbar radiculopathy-- continues to take norco 5/325 mg BID PRN, PDMP reviewed today. Continues to see orthopedics for ongoing chronic pain management. States he tries to take it sparingly. We discussed the DEA requirements for the prescribing of opioids.     Current Outpatient Medications  Medication Instructions   alfuzosin (UROXATRAL) 10 mg, Daily   aspirin  EC 81 mg, Oral, Daily, Swallow whole.   cyclobenzaprine  (FLEXERIL ) 10 mg, Oral, 3 times daily PRN   empagliflozin  (JARDIANCE ) 10 mg, Oral, Daily   fluticasone  (FLONASE ) 50 MCG/ACT nasal spray 1 spray, Each Nare, 2 times daily   HYDROcodone -acetaminophen  (NORCO) 7.5-325 MG tablet 1 tablet, Oral, Every 6 hours PRN   levothyroxine  (SYNTHROID ) 50 MCG tablet TAKE 1 TABLET BY MOUTH EVERY DAY BEFORE BREAKFAST   losartan  (COZAAR ) 25 mg, Oral, Daily   Multiple Vitamins-Minerals (MEGA MULTIVITAMIN FOR MEN PO) Daily   rosuvastatin  (CRESTOR ) 5 mg, Oral, Daily   SYRINGE-NEEDLE, DISP, 3 ML (B-D 3CC LUER-LOK SYR 23GX1) 23G X 1 3 ML MISC Inject 200 mg testosterone  every 2 weeks   testosterone  cypionate (DEPOTESTOSTERONE CYPIONATE) 200  mg, Intramuscular, Every 14 days    Patient Active Problem List   Diagnosis Date Noted   Cervicalgia 01/06/2022   Nonallopathic lesion of lumbar region 07/28/2020   Nonallopathic lesion of sacral region 07/28/2020   Nonallopathic lesion of thoracic region 07/28/2020   Impaired glucose tolerance 12/04/2019   Low testosterone  in male 06/11/2019   Controlled type 2 diabetes mellitus without complication, without long-term current use of insulin (HCC) 06/11/2019   Pain in left hand 03/28/2019   Hx of adenomatous colonic polyps 10/25/2017   Lumbar radiculopathy 05/11/2016   VERTIGO 02/22/2010   Obstructive sleep apnea 09/27/2009   ERECTILE DYSFUNCTION, ORGANIC 09/07/2009   CHEST PAIN 03/13/2008   OTHER SYMPTOMS INVOLVING DIGESTIVE SYSTEM OTHER 03/12/2008   RECTAL BLEEDING, HX OF 03/12/2008   Hypothyroidism 02/22/2007   Allergic rhinitis 02/22/2007   GERD 02/22/2007   LOW BACK PAIN 02/22/2007      Review of Systems  All other systems reviewed and are negative.     Objective:     BP 100/70   Pulse 76   Temp 98.1 F (36.7 C) (Oral)   Ht 5' 10 (1.778 m)   Wt 224 lb 4.8 oz (101.7 kg)   SpO2 98%   BMI 32.18 kg/m    Physical Exam Vitals reviewed.  Constitutional:      Appearance: Normal appearance. He is well-groomed and normal weight.  Eyes:     Extraocular Movements: Extraocular movements intact.     Conjunctiva/sclera: Conjunctivae normal.  Neck:     Thyroid : No thyromegaly.  Cardiovascular:     Rate and Rhythm: Normal rate and regular rhythm.     Heart sounds: S1 normal and S2 normal. No murmur heard. Pulmonary:     Effort: Pulmonary effort is normal.     Breath sounds: Normal breath sounds and air entry. No rales.  Abdominal:     General: Abdomen is flat. Bowel sounds are normal.  Musculoskeletal:     Right lower leg: No edema.     Left lower leg: No edema.  Neurological:     General: No focal deficit present.     Mental Status: He is alert and oriented  to person, place, and time.     Gait: Gait is intact.  Psychiatric:        Mood and Affect: Mood and affect normal.      Results for orders placed or performed in visit on 11/30/23  POC HgB A1c  Result Value Ref Range   Hemoglobin A1C 5.6 4.0 - 5.6 %   HbA1c POC (<> result, manual entry)     HbA1c, POC (prediabetic range)     HbA1c, POC (controlled diabetic range)        The 10-year ASCVD risk score (Arnett DK, et al., 2019) is: 13.4%    Assessment & Plan:  Controlled type 2 diabetes mellitus without complication, without long-term current use of insulin (HCC) Assessment & Plan: Currently on crestor  5 mg daily, jardiance  10 mg daily was refilled today. A1C done today and is 5.6 which is very well controlled. Will continue the medication as prescribed. He is due for his annual blood work today for surveillance.   Orders: -     POCT glycosylated hemoglobin (Hb A1C) -     Collection capillary blood specimen -     Microalbumin / creatinine urine ratio; Future -     Comprehensive metabolic panel with GFR; Future -     Empagliflozin ; Take 1 tablet (10 mg total) by mouth daily.  Dispense: 90 tablet; Refill: 1  Low testosterone  in male Assessment & Plan: Needs CBC for surveillance today. PDMP reviewed. Checking testosterone  level as well, is due for his injection today so this level will be considered a trough level  Orders: -     Testosterone ; Future -     CBC with Differential/Platelet; Future -     Testosterone  Cypionate; Inject 1 mL (200 mg total) into the muscle every 14 (fourteen) days.  Dispense: 2 mL; Refill: 2 -     Testosterone  Cypionate  Hyperlipidemia, unspecified hyperlipidemia type -     Lipid panel; Future  Lumbar radiculopathy Assessment & Plan: Refilled 10 mg cyclobenzaprine  TID PRN and his norco today, he continues to follow with orthopedics for his chronic pain, getting injections,etc. Will continue medications as prescribed, checking serum drug screen  today  Orders: -     Drug Screen 13 with reflex Confirmation (AMP,BAR,BZO,COC,PCP,THC,OPI,OXY,MD,FEN,MEP,PPX,TRAM), Serum; Future -     Cyclobenzaprine  HCl; Take 1 tablet (10 mg total) by mouth 3 (three) times daily as needed for muscle spasms.  Dispense: 30 tablet; Refill: 5 -     HYDROcodone -Acetaminophen ; Take 1 tablet by mouth every 6 (six) hours as needed for moderate pain (pain score 4-6).  Dispense: 60 tablet; Refill: 0  Hypertension, unspecified type -     Losartan  Potassium; Take 1 tablet (25 mg total) by mouth daily.  Dispense: 90 tablet; Refill: 1  Hypothyroidism, unspecified type Assessment & Plan: Needs  new TSH and refills today on the 50 mcg dose.  Orders: -     TSH; Future -     Levothyroxine  Sodium; TAKE 1 TABLET BY MOUTH EVERY DAY BEFORE BREAKFAST  Dispense: 90 tablet; Refill: 3  Colon cancer screening -     Ambulatory referral to Gastroenterology     Return in about 3 months (around 03/01/2024) for medication refills.    Heron CHRISTELLA Sharper, MD

## 2023-11-30 NOTE — Assessment & Plan Note (Signed)
 Refilled 10 mg cyclobenzaprine  TID PRN and his norco today, he continues to follow with orthopedics for his chronic pain, getting injections,etc. Will continue medications as prescribed, checking serum drug screen today

## 2023-11-30 NOTE — Assessment & Plan Note (Signed)
 Currently on crestor  5 mg daily, jardiance  10 mg daily was refilled today. A1C done today and is 5.6 which is very well controlled. Will continue the medication as prescribed. He is due for his annual blood work today for surveillance.

## 2023-11-30 NOTE — Assessment & Plan Note (Signed)
 Needs new TSH and refills today on the 50 mcg dose.

## 2023-11-30 NOTE — Assessment & Plan Note (Signed)
 Needs CBC for surveillance today. PDMP reviewed. Checking testosterone  level as well, is due for his injection today so this level will be considered a trough level

## 2023-12-05 LAB — DRUG SCREEN 13 W/CONF , SERUM
Amphetamines, IA: NEGATIVE ng/mL
Barbiturates, IA: NEGATIVE ug/mL
Benzodiazepines, IA: NEGATIVE ng/mL
Cocaine & Metabolite, IA: NEGATIVE ng/mL
FENTANYL, IA: NEGATIVE ng/mL
MEPERIDINE, IA: NEGATIVE ng/mL
Methadone, IA: NEGATIVE ng/mL
Opiates, IA: NEGATIVE ng/mL
Oxycodones, IA: NEGATIVE ng/mL
Phencyclidine, IA: NEGATIVE ng/mL
Propoxyphene, IA: NEGATIVE ng/mL
THC(Marijuana) Metabolite, IA: NEGATIVE ng/mL
TRAMADOL, IA: NEGATIVE ng/mL

## 2023-12-10 ENCOUNTER — Ambulatory Visit: Payer: Self-pay | Admitting: Family Medicine

## 2023-12-12 ENCOUNTER — Other Ambulatory Visit: Payer: Self-pay | Admitting: Family Medicine

## 2023-12-12 ENCOUNTER — Encounter: Payer: Self-pay | Admitting: Family Medicine

## 2023-12-12 DIAGNOSIS — R7989 Other specified abnormal findings of blood chemistry: Secondary | ICD-10-CM

## 2023-12-12 MED ORDER — TESTOSTERONE CYPIONATE 200 MG/ML IM SOLN: 200.0000 mg | INTRAMUSCULAR | 2 refills | Status: DC

## 2023-12-14 ENCOUNTER — Ambulatory Visit (INDEPENDENT_AMBULATORY_CARE_PROVIDER_SITE_OTHER)

## 2023-12-14 DIAGNOSIS — R7989 Other specified abnormal findings of blood chemistry: Secondary | ICD-10-CM | POA: Diagnosis not present

## 2023-12-14 NOTE — Progress Notes (Signed)
 Patient is in office today for a nurse visit for Testosterone Injection. Patient Injection was given in the  Right upper quad. gluteus. Patient tolerated injection well.

## 2023-12-28 ENCOUNTER — Ambulatory Visit

## 2023-12-28 DIAGNOSIS — R7989 Other specified abnormal findings of blood chemistry: Secondary | ICD-10-CM

## 2023-12-28 NOTE — Progress Notes (Signed)
 Patient is in office today for a nurse visit for Testosterone Injection. Patient Injection was given in the  Left upper quad. gluteus. Patient tolerated injection well.

## 2024-01-10 ENCOUNTER — Encounter: Payer: Self-pay | Admitting: Family Medicine

## 2024-01-10 DIAGNOSIS — M5416 Radiculopathy, lumbar region: Secondary | ICD-10-CM

## 2024-01-10 MED ORDER — HYDROCODONE-ACETAMINOPHEN 7.5-325 MG PO TABS: 1.0000 | ORAL_TABLET | Freq: Four times a day (QID) | ORAL | 0 refills | Status: DC | PRN

## 2024-01-11 ENCOUNTER — Ambulatory Visit (INDEPENDENT_AMBULATORY_CARE_PROVIDER_SITE_OTHER)

## 2024-01-11 DIAGNOSIS — R7989 Other specified abnormal findings of blood chemistry: Secondary | ICD-10-CM

## 2024-01-11 NOTE — Progress Notes (Signed)
 Patient is in office today for a nurse visit for Testosterone  Injection. Patient Injection was given in the  Left upper quad. gluteus. Patient tolerated injection well.

## 2024-01-14 ENCOUNTER — Other Ambulatory Visit: Payer: Self-pay | Admitting: Family Medicine

## 2024-01-14 DIAGNOSIS — E119 Type 2 diabetes mellitus without complications: Secondary | ICD-10-CM

## 2024-01-25 ENCOUNTER — Ambulatory Visit: Admitting: *Deleted

## 2024-01-25 DIAGNOSIS — R7989 Other specified abnormal findings of blood chemistry: Secondary | ICD-10-CM | POA: Diagnosis not present

## 2024-01-25 MED ORDER — TESTOSTERONE CYPIONATE 200 MG/ML IM SOLN
200.0000 mg | Freq: Once | INTRAMUSCULAR | Status: AC
Start: 2024-01-25 — End: 2024-01-25
  Administered 2024-01-25: 200 mg via INTRAMUSCULAR

## 2024-01-25 NOTE — Progress Notes (Signed)
 Per orders of Dr. Casimiro Needle, injection of Testosterone cypionate 200mg  given by Sallee Lange A. Patient tolerated injection well.

## 2024-02-08 ENCOUNTER — Ambulatory Visit (INDEPENDENT_AMBULATORY_CARE_PROVIDER_SITE_OTHER): Admitting: *Deleted

## 2024-02-08 DIAGNOSIS — R7989 Other specified abnormal findings of blood chemistry: Secondary | ICD-10-CM | POA: Diagnosis not present

## 2024-02-08 MED ORDER — TESTOSTERONE CYPIONATE 200 MG/ML IM SOLN
200.0000 mg | Freq: Once | INTRAMUSCULAR | Status: AC
  Administered 2024-02-08: 200 mg via INTRAMUSCULAR

## 2024-02-08 NOTE — Progress Notes (Signed)
 Per orders of Dr. Casimiro Needle, injection of Testosterone cypionate 200mg  given by Sallee Lange A. Patient tolerated injection well.

## 2024-02-14 ENCOUNTER — Encounter: Payer: Self-pay | Admitting: Family Medicine

## 2024-02-14 DIAGNOSIS — M5416 Radiculopathy, lumbar region: Secondary | ICD-10-CM

## 2024-02-15 MED ORDER — HYDROCODONE-ACETAMINOPHEN 7.5-325 MG PO TABS: 1.0000 | ORAL_TABLET | Freq: Four times a day (QID) | ORAL | 0 refills | Status: DC | PRN

## 2024-02-22 ENCOUNTER — Ambulatory Visit (INDEPENDENT_AMBULATORY_CARE_PROVIDER_SITE_OTHER): Admitting: *Deleted

## 2024-02-22 DIAGNOSIS — R7989 Other specified abnormal findings of blood chemistry: Secondary | ICD-10-CM | POA: Diagnosis not present

## 2024-02-22 MED ORDER — TESTOSTERONE CYPIONATE 200 MG/ML IM SOLN
200.0000 mg | Freq: Once | INTRAMUSCULAR | Status: AC
  Administered 2024-02-22: 200 mg via INTRAMUSCULAR

## 2024-02-22 NOTE — Progress Notes (Signed)
 Per orders of Dr. Casimiro Needle, injection of Testosterone cypionate 200mg  given by Sallee Lange A. Patient tolerated injection well.

## 2024-03-05 ENCOUNTER — Encounter: Payer: Self-pay | Admitting: Family Medicine

## 2024-03-05 ENCOUNTER — Other Ambulatory Visit: Payer: Self-pay | Admitting: Family Medicine

## 2024-03-05 DIAGNOSIS — R7989 Other specified abnormal findings of blood chemistry: Secondary | ICD-10-CM

## 2024-03-07 ENCOUNTER — Ambulatory Visit (INDEPENDENT_AMBULATORY_CARE_PROVIDER_SITE_OTHER)

## 2024-03-07 DIAGNOSIS — R7989 Other specified abnormal findings of blood chemistry: Secondary | ICD-10-CM | POA: Diagnosis not present

## 2024-03-07 NOTE — Progress Notes (Signed)
 Patient is in office today for a nurse visit for Testosterone  Injection. Patient Injection was given in the  Left upper quad. gluteus. Patient tolerated injection well.

## 2024-03-11 ENCOUNTER — Encounter: Payer: Self-pay | Admitting: Family Medicine

## 2024-03-11 ENCOUNTER — Ambulatory Visit: Admitting: Family Medicine

## 2024-03-11 VITALS — BP 100/70 | HR 53 | Temp 98.3°F | Ht 70.0 in | Wt 230.2 lb

## 2024-03-11 DIAGNOSIS — H6502 Acute serous otitis media, left ear: Secondary | ICD-10-CM

## 2024-03-11 DIAGNOSIS — H65192 Other acute nonsuppurative otitis media, left ear: Secondary | ICD-10-CM

## 2024-03-11 MED ORDER — AMOXICILLIN 875 MG PO TABS: 875.0000 mg | ORAL_TABLET | Freq: Two times a day (BID) | ORAL | 0 refills | Status: AC

## 2024-03-11 NOTE — Progress Notes (Signed)
 Established Patient Office Visit  Subjective   Patient ID: Walter Jordan., male    DOB: Aug 20, 1965  Age: 58 y.o. MRN: [id number]  Chief Complaint  Patient presents with   Ear Fullness    Patient complains of left ear fullness x3 weeks, noticed post flight-after water got into the ear while soaking in the bath, tried hydrogen peroxide with no relief    Ear Fullness  Pertinent negatives include no ear discharge or hearing loss.   Discussed the use of AI scribe software for clinical note transcription with the patient, who gave verbal consent to proceed.  History of Present Illness   Walter Jordan. is a 58 year old male who presents with ear fullness and inability to equalize ear pressure.  He has experienced a sensation of fullness in his ear for the past three weeks, described as 'slushing'. Symptoms began after a trip to [facility], during which he flew on a plane and submerged his head in a bathtub. He is unable to equalize ear pressure using his usual technique of holding his breath, pinching his nose, and blowing. Chewing gum on flights, which previously helped, has been ineffective. There is no ear pain, fever, chills, or sinus congestion. He denies any allergies to antibiotics and is not taking any medications for this issue.       Current Outpatient Medications  Medication Instructions   alfuzosin (UROXATRAL) 10 mg, Daily   amoxicillin  (AMOXIL ) 875 mg, Oral, 2 times daily   aspirin  EC 81 mg, Oral, Daily, Swallow whole.   cyclobenzaprine  (FLEXERIL ) 10 mg, Oral, 3 times daily PRN   empagliflozin  (JARDIANCE ) 10 mg, Oral, Daily   fluticasone  (FLONASE ) 50 MCG/ACT nasal spray 1 spray, Each Nare, 2 times daily   HYDROcodone -acetaminophen  (NORCO) 7.5-325 MG tablet 1 tablet, Oral, Every 6 hours PRN   levothyroxine  (SYNTHROID ) 50 MCG tablet TAKE 1 TABLET BY MOUTH EVERY DAY BEFORE BREAKFAST   losartan  (COZAAR ) 25 mg, Oral, Daily   Multiple Vitamins-Minerals (MEGA MULTIVITAMIN FOR  MEN PO) Daily   rosuvastatin  (CRESTOR ) 5 mg, Oral, Daily   SYRINGE-NEEDLE, DISP, 3 ML (B-D 3CC LUER-LOK SYR 23GX1) 23G X 1 3 ML MISC Inject 200 mg testosterone  every 2 weeks   testosterone  cypionate (DEPOTESTOSTERONE CYPIONATE) 200 MG/ML injection INJECT 1 ML (200 MG TOTAL) INTO THE MUSCLE EVERY 14 DAYS    Patient Active Problem List   Diagnosis Date Noted   Cervicalgia 01/06/2022   Nonallopathic lesion of lumbar region 07/28/2020   Nonallopathic lesion of sacral region 07/28/2020   Nonallopathic lesion of thoracic region 07/28/2020   Impaired glucose tolerance 12/04/2019   Low testosterone  in male 06/11/2019   Controlled type 2 diabetes mellitus without complication, without long-term current use of insulin (HCC) 06/11/2019   Pain in left hand 03/28/2019   Hx of adenomatous colonic polyps 10/25/2017   Lumbar radiculopathy 05/11/2016   VERTIGO 02/22/2010   Obstructive sleep apnea 09/27/2009   ERECTILE DYSFUNCTION, ORGANIC 09/07/2009   CHEST PAIN 03/13/2008   OTHER SYMPTOMS INVOLVING DIGESTIVE SYSTEM OTHER 03/12/2008   RECTAL BLEEDING, HX OF 03/12/2008   Hypothyroidism 02/22/2007   Allergic rhinitis 02/22/2007   GERD 02/22/2007   LOW BACK PAIN 02/22/2007     Review of Systems  Constitutional:  Negative for chills and fever.  HENT:  Negative for congestion, ear discharge, ear pain, hearing loss, sinus pain and tinnitus.       Objective:     BP 100/70   Pulse (!) 53  Temp 98.3 F (36.8 C) (Oral)   Ht 5' 10 (1.778 m)   Wt 230 lb 3.2 oz (104.4 kg)   SpO2 98%   BMI 33.03 kg/m    Physical Exam Vitals reviewed.  Constitutional:      Appearance: Normal appearance. He is normal weight.  HENT:     Right Ear: Tympanic membrane and ear canal normal.     Left Ear: A middle ear effusion is present. Tympanic membrane is bulging. Tympanic membrane has decreased mobility.  Neurological:     Mental Status: He is alert.      No results found for any visits on  03/11/24.    The 10-year ASCVD risk score (Arnett DK, et al., 2019) is: 13.3%    Assessment & Plan:  Non-recurrent acute serous otitis media of left ear -     Amoxicillin ; Take 1 tablet (875 mg total) by mouth 2 (two) times daily for 10 days.  Dispense: 20 tablet; Refill: 0  Acute MEE (middle ear effusion), left -     Ambulatory referral to ENT   Assessment and Plan    Otitis media with effusion, right ear Otitis media with effusion in the right ear, likely secondary to recent air travel and water exposure. Fluid is present behind the eardrum, causing inflammation but no pain. Unable to equalize ear pressure, contributing to fluid retention. No signs of infection such as fever or chills. No wax obstruction noted. - Prescribed amoxicillin  twice daily for 7-10 days. - Advised techniques to equalize ear pressure, including holding nose and blowing, chewing gum, and opening jaw wide. - Recommended Flonase  nasal spray to reduce inflammation. - Suggested Sudafed to help shrink and open the Eustachian tube. - Placed referral to ENT for further evaluation if ear does not pop and symptoms persist.        No follow-ups on file.    Walter CHRISTELLA Sharper, MD

## 2024-03-11 NOTE — Patient Instructions (Signed)
 Decongestants may help the ear to pop.

## 2024-03-14 ENCOUNTER — Encounter: Payer: Self-pay | Admitting: Family Medicine

## 2024-03-19 ENCOUNTER — Encounter (INDEPENDENT_AMBULATORY_CARE_PROVIDER_SITE_OTHER): Payer: Self-pay | Admitting: Physician Assistant

## 2024-03-19 ENCOUNTER — Ambulatory Visit (INDEPENDENT_AMBULATORY_CARE_PROVIDER_SITE_OTHER): Admitting: Physician Assistant

## 2024-03-19 ENCOUNTER — Ambulatory Visit (INDEPENDENT_AMBULATORY_CARE_PROVIDER_SITE_OTHER): Admitting: Audiology

## 2024-03-19 VITALS — BP 137/84 | HR 65 | Temp 98.7°F | Ht 71.0 in | Wt 225.0 lb

## 2024-03-19 DIAGNOSIS — H9012 Conductive hearing loss, unilateral, left ear, with unrestricted hearing on the contralateral side: Secondary | ICD-10-CM

## 2024-03-19 DIAGNOSIS — H9392 Unspecified disorder of left ear: Secondary | ICD-10-CM

## 2024-03-19 DIAGNOSIS — H6592 Unspecified nonsuppurative otitis media, left ear: Secondary | ICD-10-CM

## 2024-03-19 MED ORDER — LORATADINE 10 MG PO TABS: 10.0000 mg | ORAL_TABLET | Freq: Every day | ORAL | 11 refills | Status: AC

## 2024-03-19 MED ORDER — FLUTICASONE PROPIONATE 50 MCG/ACT NA SUSP
2.0000 | Freq: Every day | NASAL | 6 refills | Status: AC
Start: 2024-03-19 — End: ?

## 2024-03-19 NOTE — Progress Notes (Signed)
  [address] [place], [place] [address] [phone]  Audiological Evaluation    Name: Walter Jordan.     DOB:   1965-08-18      MRN:   [id number]                                                                                     Service Date: 03/19/2024     Accompanied by: unaccompanied   Patient comes today after Reyes Cohen, PA-C sent a referral for a hearing evaluation due to concerns with left middle ear fluid.   Symptoms Yes Details  Hearing loss  [x]  Left side- muffled  Tinnitus  []    Ear pain/ infections/pressure  [x]  Left ear middle ear fluid  Balance problems  []    Noise exposure history  [x]  Reports wears protection at work, gun range and when riding motorcycles  Previous ear surgeries  []    Family history of hearing loss  []    Amplification  []    Other  []      Otoscopy: Right ear: Clear external ear canal and notable landmarks visualized on the tympanic membrane. Left ear:  Abnormal eardrum appearance.  Tympanometry: Right ear: Normal external ear canal volume with normal middle ear pressure and tympanic membrane compliance (Type A). Findings are suggestive of normal middle ear function. Left ear: Normal external ear canal volume with negative middle ear pressure and reduced tympanic membrane compliance. Findings are suggestive of abnormal middle ear function.    Hearing Evaluation The hearing test results were completed under headphones and results are deemed to be of good reliability. Test technique:  conventional    Pure tone Audiometry: Right ear- Normal to borderline normal hearing from [phone] Hz.   Left ear-  Normal to moderate conductive hearing loss from 250 Hz - 8000 Hz.  Speech Audiometry: Right ear- Speech Reception Threshold (SRT) was obtained at 10 dBHL. Left ear-Speech Reception Threshold (SRT) was obtained at 20 dBHL.   Word Recognition Score Tested using NU-6 (recorded) Right ear: 100% was obtained at a presentation  level of 50 dBHL with contralateral masking which is deemed as  excellent. Left ear: 100% was obtained at a presentation level of 60 dBHL with contralateral masking which is deemed as  excellent.   Impression: There is a significant difference in pure-tone thresholds between ears (air conduction only), worse in the left ear.   Recommendations: Follow up with ENT as scheduled for today. Repeat audiogram after medical care.   Cortlan Dolin MARIE LEROUX-MARTINEZ, AUD

## 2024-03-19 NOTE — Progress Notes (Signed)
 Dear Dr. Ozell, Here is my assessment for our mutual patient, Walter Jordan. Thank you for allowing me the opportunity to care for your patient. Please do not hesitate to contact me should you have any other questions. Sincerely, Walter Cohen PA-C  Otolaryngology Clinic Note Referring provider: Dr. Ozell HPI:  Walter Jordan is a 58 y.o. male kindly referred by Dr. Ozell   Discussed the use of AI scribe software for clinical note transcription with the patient, who gave verbal consent to proceed.  History of Present Illness    Walter Jordan. is a 58 year old male who presents with fluid behind the eardrum and ear pressure.  He has experienced a sensation of fluid or water in his ear for at least four weeks, initially noticed after a flight to [facility] in late September to early October. He suspects water entered his ear during a bath, but the fluid has not drained since.  He visited his primary care physician who diagnosed an ear infection on the left side and prescribed amoxicillin . He is currently on day eight of the antibiotic course but reports no improvement in symptoms.  He describes the sensation as pressure in the ear with amplified sound and a feeling of movement when he moves his head. He has not experienced this problem before and denies any history of recurrent ear infections or ear problems during childhood.  No ringing, clicking, or popping in the ear, and his hearing is normal aside from the amplified sound. He has no history of seasonal allergies, nasal congestion, or smoking.  He uses a CPAP machine for sleep apnea and reports it helps him sleep at night. No throat pain or fever and he has not experienced any recent illness or cold symptoms.     Independent Review of Additional Tests or Records:   Otoscopy: Right ear: Clear external ear canal and notable landmarks visualized on the tympanic membrane. Left ear:  Abnormal eardrum appearance.   Tympanometry: Right  ear: Normal external ear canal volume with normal middle ear pressure and tympanic membrane compliance (Type A). Findings are suggestive of normal middle ear function. Left ear: Normal external ear canal volume with negative middle ear pressure and reduced tympanic membrane compliance. Findings are suggestive of abnormal middle ear function.      Hearing Evaluation The hearing test results were completed under headphones and results are deemed to be of good reliability. Test technique:  conventional     Pure tone Audiometry: Right ear- Normal to borderline normal hearing from [phone].   Left ear-  Normal to moderate conductive hearing loss from 250 Hz - 8000 Hz.   Speech Audiometry: Right ear- Speech Reception Threshold (SRT) was obtained at 10 dBHL. Left ear-Speech Reception Threshold (SRT) was obtained at 20 dBHL.   Word Recognition Score Tested using NU-6 (recorded) Right ear: 100% was obtained at a presentation level of 50 dBHL with contralateral masking which is deemed as  excellent. Left ear: 100% was obtained at a presentation level of 60 dBHL with contralateral masking which is deemed as  excellent.   Impression: There is a significant difference in pure-tone thresholds between ears (air conduction only), worse in the left ear.   Recommendations: Follow up with ENT as scheduled for today. Repeat audiogram after medical care.  PMH/Meds/All/SocHx/FamHx/ROS:   Past Medical History:  Diagnosis Date   Allergic rhinitis    Allergy    Arthritis    back   ED (erectile dysfunction)  Elevated lipase    Enlarged prostate    GERD (gastroesophageal reflux disease)    Hepatic steatosis    Hypothyroidism    Low back pain    Low testosterone     Sleep apnea    had sleep study 09-10-16- needs new cpap     Past Surgical History:  Procedure Laterality Date   COLONOSCOPY  03/19/2008; 2018   ext. hems    HERNIA REPAIR     x2 1968, inguinal 1973   WISDOM TOOTH EXTRACTION       Family History  Problem Relation Age of Onset   Cancer Father        sarcoma- both kidneys removed   Diabetes Father        s/p renal transplant   Arthritis Mother    High blood pressure Mother    High Cholesterol Mother    Heart disease Mother    Diabetes Brother    Diabetes Maternal Grandmother    Diabetes Paternal Grandmother    Cancer Paternal Grandmother        colon   Depression Other    Diabetes Other    Hypertension Other    Kidney disease Other    Obesity Sister    Stroke Maternal Grandfather 60       tobacco abuse   Alcohol abuse Maternal Grandfather    Early death Paternal Grandfather        accidental   Prostate cancer Maternal Uncle    Heart attack Maternal Uncle 60   Prostate cancer Maternal Uncle    Cancer Paternal Uncle        uncertain type   Alcohol abuse Paternal Uncle    Colon polyps Neg Hx    Rectal cancer Neg Hx    Stomach cancer Neg Hx    Esophageal cancer Neg Hx    Pancreatic cancer Neg Hx      Social Connections: Moderately Isolated (11/29/2023)   Social Connection and Isolation Panel    Frequency of Communication with Friends and Family: Once a week    Frequency of Social Gatherings with Friends and Family: Once a week    Attends Religious Services: More than 4 times per year    Active Member of [employer] or Organizations: No    Attends Engineer, Structural: Not on file    Marital Status: Married      Current Outpatient Medications:    alfuzosin (UROXATRAL) 10 MG 24 hr tablet, Take 10 mg by mouth daily., Disp: , Rfl:    amoxicillin  (AMOXIL ) 875 MG tablet, Take 1 tablet (875 mg total) by mouth 2 (two) times daily for 10 days., Disp: 20 tablet, Rfl: 0   aspirin  EC 81 MG tablet, Take 1 tablet (81 mg total) by mouth daily. Swallow whole., Disp: 90 tablet, Rfl: 3   cyclobenzaprine  (FLEXERIL ) 10 MG tablet, Take 1 tablet (10 mg total) by mouth 3 (three) times daily as needed for muscle spasms., Disp: 30 tablet, Rfl: 5   empagliflozin   (JARDIANCE ) 10 MG TABS tablet, Take 1 tablet (10 mg total) by mouth daily., Disp: 90 tablet, Rfl: 1   fluticasone  (FLONASE ) 50 MCG/ACT nasal spray, PLACE 1 SPRAY INTO BOTH NOSTRILS 2 (TWO) TIMES DAILY, Disp: 16 mL, Rfl: 5   fluticasone  (FLONASE ) 50 MCG/ACT nasal spray, Place 2 sprays into both nostrils daily., Disp: 16 g, Rfl: 6   HYDROcodone -acetaminophen  (NORCO) 7.5-325 MG tablet, Take 1 tablet by mouth every 6 (six) hours as needed for moderate pain (pain  score 4-6)., Disp: 60 tablet, Rfl: 0   levothyroxine  (SYNTHROID ) 50 MCG tablet, TAKE 1 TABLET BY MOUTH EVERY DAY BEFORE BREAKFAST, Disp: 90 tablet, Rfl: 3   loratadine (CLARITIN) 10 MG tablet, Take 1 tablet (10 mg total) by mouth daily., Disp: 30 tablet, Rfl: 11   losartan  (COZAAR ) 25 MG tablet, Take 1 tablet (25 mg total) by mouth daily., Disp: 90 tablet, Rfl: 1   Multiple Vitamins-Minerals (MEGA MULTIVITAMIN FOR MEN PO), Take by mouth daily., Disp: , Rfl:    rosuvastatin  (CRESTOR ) 5 MG tablet, TAKE 1 TABLET (5 MG TOTAL) BY MOUTH DAILY., Disp: 90 tablet, Rfl: 0   SYRINGE-NEEDLE, DISP, 3 ML (B-D 3CC LUER-LOK SYR 23GX1) 23G X 1 3 ML MISC, Inject 200 mg testosterone  every 2 weeks, Disp: 30 each, Rfl: 0   testosterone  cypionate (DEPOTESTOSTERONE CYPIONATE) 200 MG/ML injection, INJECT 1 ML (200 MG TOTAL) INTO THE MUSCLE EVERY 14 DAYS, Disp: 2 mL, Rfl: 2  Current Facility-Administered Medications:    testosterone  cypionate (DEPOTESTOSTERONE CYPIONATE) injection 200 mg, 200 mg, Intramuscular, Q14 Days, Ozell Heron HERO, MD, 200 mg at 05/03/23 1030   testosterone  cypionate (DEPOTESTOSTERONE CYPIONATE) injection 200 mg, 200 mg, Intramuscular, Q14 Days, Nafziger, Cory, NP, 200 mg at 07/13/23 1012   testosterone  cypionate (DEPOTESTOSTERONE CYPIONATE) injection 200 mg, 200 mg, Intramuscular, Q14 Days, Ozell Heron HERO, MD, 200 mg at 10/05/23 1025   testosterone  cypionate (DEPOTESTOSTERONE CYPIONATE) injection 200 mg, 200 mg, Intramuscular, Q14 Days,  Ozell Heron HERO, MD, 200 mg at 03/07/24 1038   Physical Exam:   BP 137/84   Pulse 65   Temp 98.7 F (37.1 C)   Ht 5' 11 (1.803 m)   Wt 225 lb (102.1 kg)   SpO2 96%   BMI 31.38 kg/m   Pertinent Findings  CN II-XII intact Right EAC clear, TM intact with well-pneumatized middle ear space, left EAC clear TM intact with mucoid effusion Anterior rhinoscopy: Septum left deviation; bilateral inferior turbinates with normal hypertrophy No lesions of oral cavity/oropharynx; dentition normal limits No obviously palpable neck masses/lymphadenopathy/thyromegaly No respiratory distress or stridor   Seprately Identifiable Procedures:  PROCEDURE: Bilateral Diagnostic Nasal Endoscopy Pre-procedure diagnosis: Concern for left mucoid effusion  Post-procedure diagnosis: same Indication: See pre-procedure diagnosis and physical exam above Complications: None apparent EBL: 0 mL Anesthesia: Lidocaine  4% and topical decongestant was topically sprayed in each nasal cavity  Description of Procedure:  Patient was identified. A flexible endoscope was utilized to evaluate the sinonasal cavities, mucosa, sinus ostia and turbinates and septum and eustachian tube meatus.  Overall, signs of mucosal inflammation arenot noted.  Also noted was mild nasal hypertrophy.  No mucopurulence, polyps, or masses noted.    Photodocumentation was obtained.       CPT CODE -- 68768 - Mod 25   Impression & Plans:  Deano Tomaszewski is a 58 y.o. male with the following   Assessment and Plan    Left middle ear effusion   Mucoid effusion likely due to Eustachian tube dysfunction post-flight. No infection present. Fluid expected to resolve with conservative management. - Order hearing evaluation. - Endoscopic exam with no lesions obstruction ET - Prescribe Flonase  nasal spray. - Prescribe Claritin daily antihistamine. - Advise nasal saline irrigation. - Instruct to use Afrin before and after flights, not exceeding  three days of use. - Discussed importance of starting medications to reduce inflammation and prevent complications during flights.      - f/u Phone call with audio results, three month follow up  Thank you for allowing me the opportunity to care for your patient. Please do not hesitate to contact me should you have any other questions.  Sincerely, Walter Cohen PA-C [facility] Phone: [phone] Fax: [phone]  03/19/2024, 1:31 PM

## 2024-03-20 ENCOUNTER — Encounter: Payer: Self-pay | Admitting: Family Medicine

## 2024-03-20 DIAGNOSIS — M5416 Radiculopathy, lumbar region: Secondary | ICD-10-CM

## 2024-03-21 ENCOUNTER — Ambulatory Visit (INDEPENDENT_AMBULATORY_CARE_PROVIDER_SITE_OTHER): Admitting: *Deleted

## 2024-03-21 DIAGNOSIS — R7989 Other specified abnormal findings of blood chemistry: Secondary | ICD-10-CM

## 2024-03-21 MED ORDER — TESTOSTERONE CYPIONATE 200 MG/ML IM SOLN
200.0000 mg | Freq: Once | INTRAMUSCULAR | Status: AC
  Administered 2024-03-21: 200 mg via INTRAMUSCULAR

## 2024-03-21 MED ORDER — HYDROCODONE-ACETAMINOPHEN 7.5-325 MG PO TABS: 1.0000 | ORAL_TABLET | Freq: Four times a day (QID) | ORAL | 0 refills | Status: DC | PRN

## 2024-03-21 NOTE — Progress Notes (Signed)
Per orders of Dr. Burchette, injection of Testosterone cypionate 200mg given by Adaiah Morken A. Patient tolerated injection well.  

## 2024-03-24 ENCOUNTER — Encounter: Payer: Self-pay | Admitting: Radiology

## 2024-04-04 ENCOUNTER — Ambulatory Visit

## 2024-04-04 DIAGNOSIS — R7989 Other specified abnormal findings of blood chemistry: Secondary | ICD-10-CM | POA: Diagnosis not present

## 2024-04-04 NOTE — Progress Notes (Cosign Needed Addendum)
 Per orders of Dr. Casimiro Needle, injection of Testosterone Cypionate 200 mg given by Braxen Dobek L Naryiah Schley. Patient tolerated injection well.

## 2024-04-21 ENCOUNTER — Ambulatory Visit: Admitting: *Deleted

## 2024-04-21 DIAGNOSIS — R7989 Other specified abnormal findings of blood chemistry: Secondary | ICD-10-CM

## 2024-04-21 MED ORDER — TESTOSTERONE CYPIONATE 200 MG/ML IM SOLN
200.0000 mg | Freq: Once | INTRAMUSCULAR | Status: AC
  Administered 2024-04-21: 200 mg via INTRAMUSCULAR

## 2024-04-21 NOTE — Progress Notes (Signed)
 Per orders of Dr. Casimiro Needle, injection of Testosterone cypionate 200mg  given by Sallee Lange A. Patient tolerated injection well.

## 2024-04-22 ENCOUNTER — Other Ambulatory Visit: Payer: Self-pay | Admitting: Family Medicine

## 2024-04-22 DIAGNOSIS — E119 Type 2 diabetes mellitus without complications: Secondary | ICD-10-CM

## 2024-04-25 ENCOUNTER — Encounter: Payer: Self-pay | Admitting: Family Medicine

## 2024-04-25 NOTE — Telephone Encounter (Signed)
 Pt requesting refill on Hydrocodone . Says OTC meds not helping; Last fill 03/21/24 60tab; Last OV 03/11/24 for L ear; Next appt on 05/07/24.

## 2024-04-28 ENCOUNTER — Telehealth: Payer: Self-pay | Admitting: Physical Medicine and Rehabilitation

## 2024-04-28 NOTE — Telephone Encounter (Signed)
 Pt called and said he needs an appointment for injection. CB#[phone]

## 2024-04-30 ENCOUNTER — Encounter: Payer: Self-pay | Admitting: Family Medicine

## 2024-04-30 DIAGNOSIS — M5416 Radiculopathy, lumbar region: Secondary | ICD-10-CM

## 2024-04-30 MED ORDER — HYDROCODONE-ACETAMINOPHEN 7.5-325 MG PO TABS: 1.0000 | ORAL_TABLET | Freq: Four times a day (QID) | ORAL | 0 refills | Status: DC | PRN

## 2024-05-01 ENCOUNTER — Encounter: Payer: Self-pay | Admitting: Gastroenterology

## 2024-05-02 ENCOUNTER — Ambulatory Visit

## 2024-05-02 NOTE — Progress Notes (Signed)
 Patient is in office today for a nurse visit for Testosterone Injection. Patient Injection was given in the  Right upper quad. gluteus. Patient tolerated injection well.

## 2024-05-07 ENCOUNTER — Ambulatory Visit: Admitting: Family Medicine

## 2024-05-07 VITALS — BP 110/80 | HR 73 | Temp 98.4°F | Ht 70.5 in | Wt 226.9 lb

## 2024-05-07 DIAGNOSIS — N400 Enlarged prostate without lower urinary tract symptoms: Secondary | ICD-10-CM | POA: Diagnosis not present

## 2024-05-07 DIAGNOSIS — E785 Hyperlipidemia, unspecified: Secondary | ICD-10-CM

## 2024-05-07 DIAGNOSIS — E039 Hypothyroidism, unspecified: Secondary | ICD-10-CM | POA: Diagnosis not present

## 2024-05-07 DIAGNOSIS — Z23 Encounter for immunization: Secondary | ICD-10-CM

## 2024-05-07 DIAGNOSIS — I10 Essential (primary) hypertension: Secondary | ICD-10-CM | POA: Diagnosis not present

## 2024-05-07 DIAGNOSIS — R7989 Other specified abnormal findings of blood chemistry: Secondary | ICD-10-CM | POA: Diagnosis not present

## 2024-05-07 DIAGNOSIS — E119 Type 2 diabetes mellitus without complications: Secondary | ICD-10-CM

## 2024-05-07 LAB — CBC WITH DIFFERENTIAL/PLATELET
Basophils Absolute: 0.1 K/uL (ref 0.0–0.1)
Basophils Relative: 1.4 % (ref 0.0–3.0)
Eosinophils Absolute: 0.1 K/uL (ref 0.0–0.7)
Eosinophils Relative: 2.5 % (ref 0.0–5.0)
HCT: 52.5 % — ABNORMAL HIGH (ref 39.0–52.0)
Hemoglobin: 17.8 g/dL — ABNORMAL HIGH (ref 13.0–17.0)
Lymphocytes Relative: 31.8 % (ref 12.0–46.0)
Lymphs Abs: 1.5 K/uL (ref 0.7–4.0)
MCHC: 34 g/dL (ref 30.0–36.0)
MCV: 95.4 fl (ref 78.0–100.0)
Monocytes Absolute: 0.8 K/uL (ref 0.1–1.0)
Monocytes Relative: 17 % — ABNORMAL HIGH (ref 3.0–12.0)
Neutro Abs: 2.3 K/uL (ref 1.4–7.7)
Neutrophils Relative %: 47.3 % (ref 43.0–77.0)
Platelets: 213 K/uL (ref 150.0–400.0)
RBC: 5.5 Mil/uL (ref 4.22–5.81)
RDW: 13.9 % (ref 11.5–15.5)
WBC: 4.8 K/uL (ref 4.0–10.5)

## 2024-05-07 LAB — HEMOGLOBIN A1C: Hgb A1c MFr Bld: 6.2 % (ref 4.6–6.5)

## 2024-05-07 LAB — TSH: TSH: 4.08 u[IU]/mL (ref 0.35–5.50)

## 2024-05-07 LAB — TESTOSTERONE: Testosterone: 971.12 ng/dL — ABNORMAL HIGH (ref 300.00–890.00)

## 2024-05-07 LAB — PSA: PSA: 2.62 ng/mL (ref 0.10–4.00)

## 2024-05-07 MED ORDER — TESTOSTERONE CYPIONATE 200 MG/ML IM SOLN: 200.0000 mg | INTRAMUSCULAR | 2 refills | Status: AC

## 2024-05-07 MED ORDER — ALFUZOSIN HCL ER 10 MG PO TB24: 10.0000 mg | ORAL_TABLET | Freq: Every day | ORAL | 1 refills | Status: AC

## 2024-05-07 NOTE — Progress Notes (Signed)
 Complete physical exam  Patient: Walter Jordan.   DOB: 1966/04/19   58 y.o. Male  MRN: [id number]  Subjective:    Chief Complaint  Patient presents with   Annual Exam    Walter Jordan. is a 58 y.o. male who presents today for a complete physical exam. He reports consuming a general diet. Doesn't really eat daily fruits and veggies, does eat salad sometimes. Eats a lot of protein, doesn't eat a lot of processed foods, occasional rice and pasta. Does eat fast food sometimes daily, usually chicken sandwiches.  Gym/ health club routine includes mod to heavy weightlifting and 2-3 times per week, doesn't d a lot of cardio. He generally feels well. He reports sleeping fairly well. He does not have additional problems to discuss today.    Most recent fall risk assessment:     No data to display           Most recent depression screenings:    05/07/2024    8:04 AM 05/07/2023    8:19 AM  PHQ 2/9 Scores  PHQ - 2 Score 0 0  PHQ- 9 Score 1 2      Data saved with a previous flowsheet row definition    Vision:Within last year and Dental: No current dental problems and Last dental visit: 1 year ago, it is not consistent, needs a new one.  Patient Active Problem List   Diagnosis Date Noted   Cervicalgia 01/06/2022   Nonallopathic lesion of lumbar region 07/28/2020   Nonallopathic lesion of sacral region 07/28/2020   Nonallopathic lesion of thoracic region 07/28/2020   Impaired glucose tolerance 12/04/2019   Low testosterone  in male 06/11/2019   Controlled type 2 diabetes mellitus without complication, without long-term current use of insulin (HCC) 06/11/2019   Pain in left hand 03/28/2019   Hx of adenomatous colonic polyps 10/25/2017   Lumbar radiculopathy 05/11/2016   VERTIGO 02/22/2010   Obstructive sleep apnea 09/27/2009   ERECTILE DYSFUNCTION, ORGANIC 09/07/2009   CHEST PAIN 03/13/2008   OTHER SYMPTOMS INVOLVING DIGESTIVE SYSTEM OTHER 03/12/2008   RECTAL BLEEDING, HX OF  03/12/2008   Hypothyroidism 02/22/2007   Allergic rhinitis 02/22/2007   GERD 02/22/2007   LOW BACK PAIN 02/22/2007      Patient Care Team: Ozell Heron HERO, MD as PCP - General (Family Medicine) Lonni Slain, MD as PCP - Cardiology (Cardiology)   Show/hide medication list[1]  Review of Systems  HENT:  Negative for hearing loss.   Eyes:  Negative for blurred vision.  Respiratory:  Negative for shortness of breath.   Cardiovascular:  Negative for chest pain.  Gastrointestinal: Negative.   Genitourinary: Negative.   Musculoskeletal:  Negative for back pain.  Neurological:  Negative for headaches.  Psychiatric/Behavioral:  Negative for depression.        Objective:     BP 110/80   Pulse 73   Temp 98.4 F (36.9 C) (Oral)   Ht 5' 10.5 (1.791 m)   Wt 226 lb 14.4 oz (102.9 kg)   SpO2 98%   BMI 32.10 kg/m    Physical Exam Vitals reviewed.  Constitutional:      Appearance: Normal appearance. He is well-groomed and normal weight.  HENT:     Right Ear: Tympanic membrane and ear canal normal.     Left Ear: Tympanic membrane and ear canal normal.     Mouth/Throat:     Mouth: Mucous membranes are moist.     Pharynx: No posterior oropharyngeal  erythema.  Eyes:     Extraocular Movements: Extraocular movements intact.     Conjunctiva/sclera: Conjunctivae normal.  Neck:     Thyroid : No thyromegaly.  Cardiovascular:     Rate and Rhythm: Normal rate and regular rhythm.     Heart sounds: S1 normal and S2 normal. No murmur heard. Pulmonary:     Effort: Pulmonary effort is normal.     Breath sounds: Normal breath sounds and air entry. No rales.  Abdominal:     General: Abdomen is flat. Bowel sounds are normal.  Musculoskeletal:     Right lower leg: No edema.     Left lower leg: No edema.  Lymphadenopathy:     Cervical: No cervical adenopathy.  Neurological:     General: No focal deficit present.     Mental Status: He is alert and oriented to person, place,  and time.     Gait: Gait is intact.  Psychiatric:        Mood and Affect: Mood and affect normal.      No results found for any visits on 05/07/24.     Assessment & Plan:    Routine Health Maintenance and Physical Exam  Immunization History  Administered Date(s) Administered   Influenza Split 03/11/2012, 03/12/2013   Influenza, Seasonal, Injecte, Preservative Fre 05/07/2024   Influenza,inj,Quad PF,6+ Mos 03/12/2020   Influenza-Unspecified 01/20/2014, 03/01/2016, 04/22/2019   PFIZER(Purple Top)SARS-COV-2 Vaccination 07/21/2019, 08/21/2019, 06/03/2020   Tdap 04/19/2016   Zoster Recombinant(Shingrix) 12/04/2019    Health Maintenance  Topic Date Due   Pneumococcal Vaccine: 50+ Years (1 of 2 - PCV) Never done   Hepatitis B Vaccines 19-59 Average Risk (1 of 3 - 19+ 3-dose series) Never done   Zoster Vaccines- Shingrix (2 of 2) 01/29/2020   Colonoscopy  09/26/2023   COVID-19 Vaccine (4 - 2025-26 season) 01/21/2024   FOOT EXAM  02/15/2024   HIV Screening  11/29/2024 (Originally 07/25/1980)   HEMOGLOBIN A1C  06/01/2024   OPHTHALMOLOGY EXAM  07/23/2024   Diabetic kidney evaluation - eGFR measurement  11/29/2024   Diabetic kidney evaluation - Urine ACR  11/29/2024   DTaP/Tdap/Td (2 - Td or Tdap) 04/19/2026   Influenza Vaccine  Completed   Hepatitis C Screening  Completed   HPV VACCINES  Aged Out   Meningococcal B Vaccine  Aged Out    Discussed health benefits of physical activity, and encouraged him to engage in regular exercise appropriate for his age and condition.  Controlled type 2 diabetes mellitus without complication, without long-term current use of insulin (HCC) -     POCT glycosylated hemoglobin (Hb A1C) -     Collection capillary blood specimen -     Hemoglobin A1c; Future  Low testosterone  in male -     Testosterone ; Future -     Testosterone  Cypionate; Inject 1 mL (200 mg total) into the skin every 14 (fourteen) days.  Dispense: 2 mL; Refill: 2 -     CBC with  Differential/Platelet; Future  Hyperlipidemia, unspecified hyperlipidemia type  Hypertension, unspecified type  Benign prostatic hyperplasia, unspecified whether lower urinary tract symptoms present -     Alfuzosin  HCl ER; Take 1 tablet (10 mg total) by mouth daily.  Dispense: 90 tablet; Refill: 1 -     PSA; Future  Hypothyroidism, unspecified type -     TSH; Future  Immunization due -     Flu vaccine trivalent PF, 6mos and older(Flulaval,Afluria,Fluarix,Fluzone)  General physical exam findings are normal today. I reviewed the  patient's preventative testing, immunizations, and lifestyle habits. I made appropriate recommendations and placed orders for the appropriate tests and/or vaccinations. I counseled the patient on the CDC's recommendations for healthy exercise and diet. I counseled the patient on healthy sleep habits and stress management. Handouts to reinforce the counseling were given at the conclusion of the visit.    Return in about 3 months (around 08/05/2024) for follow up medication refills.     Heron CHRISTELLA Sharper, MD     [1]  Outpatient Medications Prior to Visit  Medication Sig   cyclobenzaprine  (FLEXERIL ) 10 MG tablet Take 1 tablet (10 mg total) by mouth 3 (three) times daily as needed for muscle spasms.   empagliflozin  (JARDIANCE ) 10 MG TABS tablet Take 1 tablet (10 mg total) by mouth daily.   fluticasone  (FLONASE ) 50 MCG/ACT nasal spray PLACE 1 SPRAY INTO BOTH NOSTRILS 2 (TWO) TIMES DAILY   fluticasone  (FLONASE ) 50 MCG/ACT nasal spray Place 2 sprays into both nostrils daily.   HYDROcodone -acetaminophen  (NORCO) 7.5-325 MG tablet Take 1 tablet by mouth every 6 (six) hours as needed for moderate pain (pain score 4-6).   levothyroxine  (SYNTHROID ) 50 MCG tablet TAKE 1 TABLET BY MOUTH EVERY DAY BEFORE BREAKFAST   loratadine  (CLARITIN ) 10 MG tablet Take 1 tablet (10 mg total) by mouth daily.   losartan  (COZAAR ) 25 MG tablet Take 1 tablet (25 mg total) by mouth daily.    Multiple Vitamins-Minerals (MEGA MULTIVITAMIN FOR MEN PO) Take by mouth daily.   rosuvastatin  (CRESTOR ) 5 MG tablet TAKE 1 TABLET (5 MG TOTAL) BY MOUTH DAILY.   SYRINGE-NEEDLE, DISP, 3 ML (B-D 3CC LUER-LOK SYR 23GX1) 23G X 1 3 ML MISC Inject 200 mg testosterone  every 2 weeks   [DISCONTINUED] alfuzosin  (UROXATRAL ) 10 MG 24 hr tablet Take 10 mg by mouth daily.   [DISCONTINUED] testosterone  cypionate (DEPOTESTOSTERONE CYPIONATE) 200 MG/ML injection INJECT 1 ML (200 MG TOTAL) INTO THE MUSCLE EVERY 14 DAYS   [DISCONTINUED] aspirin  EC 81 MG tablet Take 1 tablet (81 mg total) by mouth daily. Swallow whole.   Facility-Administered Medications Prior to Visit  Medication Dose Route Frequency Provider   testosterone  cypionate (DEPOTESTOSTERONE CYPIONATE) injection 200 mg  200 mg Intramuscular Q14 Days Sharper Heron CHRISTELLA, MD   testosterone  cypionate (DEPOTESTOSTERONE CYPIONATE) injection 200 mg  200 mg Intramuscular Q14 Days Nafziger, Darleene, NP   testosterone  cypionate (DEPOTESTOSTERONE CYPIONATE) injection 200 mg  200 mg Intramuscular Q14 Days Sharper Heron CHRISTELLA, MD   testosterone  cypionate (DEPOTESTOSTERONE CYPIONATE) injection 200 mg  200 mg Intramuscular Q14 Days Sharper Heron CHRISTELLA, MD

## 2024-05-12 ENCOUNTER — Ambulatory Visit: Payer: Self-pay | Admitting: Family Medicine

## 2024-05-19 ENCOUNTER — Ambulatory Visit

## 2024-05-19 DIAGNOSIS — R7989 Other specified abnormal findings of blood chemistry: Secondary | ICD-10-CM

## 2024-05-19 MED ORDER — TESTOSTERONE CYPIONATE 200 MG/ML IM SOLN
200.0000 mg | INTRAMUSCULAR | Status: DC
  Administered 2024-05-19: 200 mg via INTRAMUSCULAR

## 2024-05-19 NOTE — Progress Notes (Signed)
 Patient is in office today for a nurse visit for Testosterone Injection. Patient Injection was given in the  Right upper quad. gluteus. Patient tolerated injection well.

## 2024-05-21 MED ORDER — TESTOSTERONE CYPIONATE 200 MG/ML IM SOLN: 200.0000 mg | INTRAMUSCULAR | Status: AC

## 2024-05-21 NOTE — Addendum Note (Signed)
 Addended by: VANICE LUCIENNE PARAS on: 05/21/2024 09:07 AM   Modules accepted: Orders

## 2024-05-30 ENCOUNTER — Ambulatory Visit: Admitting: *Deleted

## 2024-05-30 DIAGNOSIS — R7989 Other specified abnormal findings of blood chemistry: Secondary | ICD-10-CM

## 2024-05-30 MED ORDER — TESTOSTERONE CYPIONATE 200 MG/ML IM SOLN
200.0000 mg | Freq: Once | INTRAMUSCULAR | Status: AC
  Administered 2024-05-30: 200 mg via INTRAMUSCULAR

## 2024-05-30 NOTE — Progress Notes (Signed)
 Per orders of Dr. Ozell, injection of Testosterone  cypionate 200mg  given with verbal approval from PCP also to administer EARLY due to patient going out of town-by Vernis Eid A. Patient tolerated injection well.

## 2024-06-06 ENCOUNTER — Encounter: Payer: Self-pay | Admitting: Family Medicine

## 2024-06-06 DIAGNOSIS — M5416 Radiculopathy, lumbar region: Secondary | ICD-10-CM

## 2024-06-06 MED ORDER — HYDROCODONE-ACETAMINOPHEN 7.5-325 MG PO TABS: 1.0000 | ORAL_TABLET | Freq: Four times a day (QID) | ORAL | 0 refills | Status: AC | PRN

## 2024-06-09 ENCOUNTER — Ambulatory Visit

## 2024-06-09 VITALS — Ht 70.5 in | Wt 225.0 lb

## 2024-06-09 DIAGNOSIS — Z860100 Personal history of colon polyps, unspecified: Secondary | ICD-10-CM

## 2024-06-09 MED ORDER — NA SULFATE-K SULFATE-MG SULF 17.5-3.13-1.6 GM/177ML PO SOLN: 1.0000 | Freq: Once | ORAL | 0 refills | Status: AC

## 2024-06-09 NOTE — Progress Notes (Signed)

## 2024-06-11 ENCOUNTER — Encounter: Payer: Self-pay | Admitting: Gastroenterology

## 2024-06-13 ENCOUNTER — Other Ambulatory Visit: Payer: Self-pay | Admitting: Family Medicine

## 2024-06-13 ENCOUNTER — Ambulatory Visit (INDEPENDENT_AMBULATORY_CARE_PROVIDER_SITE_OTHER)

## 2024-06-13 DIAGNOSIS — R7989 Other specified abnormal findings of blood chemistry: Secondary | ICD-10-CM | POA: Diagnosis not present

## 2024-06-13 DIAGNOSIS — E119 Type 2 diabetes mellitus without complications: Secondary | ICD-10-CM

## 2024-06-13 NOTE — Progress Notes (Signed)
 Patient is in office today for a nurse visit for Testosterone Injection. Patient Injection was given in the  Right upper quad. gluteus. Patient tolerated injection well.

## 2024-06-18 ENCOUNTER — Encounter: Payer: Self-pay | Admitting: *Deleted

## 2024-06-20 ENCOUNTER — Telehealth: Payer: Self-pay

## 2024-06-20 NOTE — Telephone Encounter (Signed)
 Phoned pt to reschedule colonoscopy, no answer, no VM set up

## 2024-06-22 ENCOUNTER — Telehealth: Payer: Self-pay

## 2024-06-22 NOTE — Telephone Encounter (Signed)
 Attempted to contact patient regarding the cancellation of their upcoming appointment and the need to reschedule. Unable to reach patient. No voicemail available, MyChart message send to patient  requesting a call to arrange a new appointment

## 2024-06-23 ENCOUNTER — Encounter: Admitting: Gastroenterology

## 2024-06-24 ENCOUNTER — Telehealth: Payer: Self-pay | Admitting: Gastroenterology

## 2024-06-24 NOTE — Telephone Encounter (Signed)
Updated prep instructions sent via mychart

## 2024-06-27 ENCOUNTER — Ambulatory Visit

## 2024-06-27 DIAGNOSIS — R7989 Other specified abnormal findings of blood chemistry: Secondary | ICD-10-CM

## 2024-06-27 NOTE — Progress Notes (Cosign Needed)
 Patient is in office today for a nurse visit for testosterone  . Patient Injection was given in the  Right upper quad. gluteus. Patient tolerated injection well.

## 2024-08-04 ENCOUNTER — Encounter: Admitting: Gastroenterology

## 2024-08-05 ENCOUNTER — Ambulatory Visit: Admitting: Family Medicine
